# Patient Record
Sex: Female | Born: 1950 | Race: White | Hispanic: No | Marital: Single | Smoking: Never smoker
Health system: Southern US, Community
[De-identification: ages and names within clinical notes are randomized; demographics above are authoritative.]

## PROBLEM LIST (undated history)

## (undated) DIAGNOSIS — J449 Chronic obstructive pulmonary disease, unspecified: Secondary | ICD-10-CM

---

## 2008-12-21 ENCOUNTER — Emergency Department: Payer: Self-pay | Admitting: Emergency Medicine

## 2011-10-15 ENCOUNTER — Other Ambulatory Visit: Payer: Self-pay

## 2011-10-15 LAB — COMPREHENSIVE METABOLIC PANEL
Alkaline Phosphatase: 65 U/L (ref 50–136)
Bilirubin,Total: 0.4 mg/dL (ref 0.2–1.0)
Calcium, Total: 9.5 mg/dL (ref 8.5–10.1)
Chloride: 101 mmol/L (ref 98–107)
Co2: 33 mmol/L — ABNORMAL HIGH (ref 21–32)
Creatinine: 0.94 mg/dL (ref 0.60–1.30)
EGFR (African American): >60
EGFR (Non-African Amer.): >60
Osmolality: 286 (ref 275–301)
SGOT(AST): 25 U/L (ref 15–37)
SGPT (ALT): 26 U/L
Sodium: 143 mmol/L (ref 136–145)

## 2011-12-30 ENCOUNTER — Emergency Department: Payer: Self-pay | Admitting: Emergency Medicine

## 2011-12-30 LAB — BASIC METABOLIC PANEL
Anion Gap: 10 (ref 7–16)
BUN: 15 mg/dL (ref 7–18)
Calcium, Total: 8.9 mg/dL (ref 8.5–10.1)
Chloride: 100 mmol/L (ref 98–107)
EGFR (African American): >60
Osmolality: 281 (ref 275–301)
Potassium: 3.7 mmol/L (ref 3.5–5.1)
Sodium: 141 mmol/L (ref 136–145)

## 2011-12-30 LAB — TROPONIN I: Troponin-I: <0.02 ng/mL

## 2011-12-30 LAB — CBC
HCT: 43 % (ref 35.0–47.0)
Platelet: 264 10*3/uL (ref 150–440)
RBC: 5.24 10*6/uL — ABNORMAL HIGH (ref 3.80–5.20)
WBC: 9.4 10*3/uL (ref 3.6–11.0)

## 2012-10-03 ENCOUNTER — Ambulatory Visit: Payer: Self-pay | Admitting: Specialist

## 2012-11-14 ENCOUNTER — Ambulatory Visit: Payer: Self-pay | Admitting: Internal Medicine

## 2012-11-16 LAB — EXPECTORATED SPUTUM ASSESSMENT W GRAM STAIN, RFLX TO RESP C

## 2012-12-05 LAB — CULTURE, FUNGUS WITHOUT SMEAR

## 2013-01-05 ENCOUNTER — Ambulatory Visit: Payer: Self-pay | Admitting: Specialist

## 2013-05-14 ENCOUNTER — Ambulatory Visit: Payer: Self-pay | Admitting: Specialist

## 2014-05-10 ENCOUNTER — Ambulatory Visit: Payer: Self-pay | Admitting: Specialist

## 2014-05-18 DIAGNOSIS — J449 Chronic obstructive pulmonary disease, unspecified: Secondary | ICD-10-CM | POA: Diagnosis present

## 2014-07-26 ENCOUNTER — Inpatient Hospital Stay (HOSPITAL_COMMUNITY)
Admission: AD | Admit: 2014-07-26 | Discharge: 2014-08-26 | DRG: 003 | Payer: 59 | Source: Other Acute Inpatient Hospital | Attending: Neurosurgery | Admitting: Neurosurgery

## 2014-07-26 ENCOUNTER — Encounter (HOSPITAL_COMMUNITY): Payer: Self-pay | Admitting: Radiology

## 2014-07-26 ENCOUNTER — Inpatient Hospital Stay (HOSPITAL_COMMUNITY): Payer: 59

## 2014-07-26 ENCOUNTER — Emergency Department: Payer: Self-pay | Admitting: Emergency Medicine

## 2014-07-26 DIAGNOSIS — T17990A Other foreign object in respiratory tract, part unspecified in causing asphyxiation, initial encounter: Secondary | ICD-10-CM | POA: Diagnosis present

## 2014-07-26 DIAGNOSIS — F419 Anxiety disorder, unspecified: Secondary | ICD-10-CM | POA: Diagnosis present

## 2014-07-26 DIAGNOSIS — E8770 Fluid overload, unspecified: Secondary | ICD-10-CM | POA: Diagnosis present

## 2014-07-26 DIAGNOSIS — R000 Tachycardia, unspecified: Secondary | ICD-10-CM | POA: Diagnosis present

## 2014-07-26 DIAGNOSIS — D649 Anemia, unspecified: Secondary | ICD-10-CM | POA: Diagnosis present

## 2014-07-26 DIAGNOSIS — Y929 Unspecified place or not applicable: Secondary | ICD-10-CM

## 2014-07-26 DIAGNOSIS — I609 Nontraumatic subarachnoid hemorrhage, unspecified: Principal | ICD-10-CM

## 2014-07-26 DIAGNOSIS — J690 Pneumonitis due to inhalation of food and vomit: Secondary | ICD-10-CM | POA: Diagnosis not present

## 2014-07-26 DIAGNOSIS — R0902 Hypoxemia: Secondary | ICD-10-CM

## 2014-07-26 DIAGNOSIS — L270 Generalized skin eruption due to drugs and medicaments taken internally: Secondary | ICD-10-CM | POA: Diagnosis present

## 2014-07-26 DIAGNOSIS — I340 Nonrheumatic mitral (valve) insufficiency: Secondary | ICD-10-CM | POA: Diagnosis present

## 2014-07-26 DIAGNOSIS — A419 Sepsis, unspecified organism: Secondary | ICD-10-CM | POA: Diagnosis not present

## 2014-07-26 DIAGNOSIS — B49 Unspecified mycosis: Secondary | ICD-10-CM | POA: Diagnosis present

## 2014-07-26 DIAGNOSIS — R6521 Severe sepsis with septic shock: Secondary | ICD-10-CM | POA: Diagnosis not present

## 2014-07-26 DIAGNOSIS — Z930 Tracheostomy status: Secondary | ICD-10-CM

## 2014-07-26 DIAGNOSIS — J9621 Acute and chronic respiratory failure with hypoxia: Secondary | ICD-10-CM | POA: Diagnosis not present

## 2014-07-26 DIAGNOSIS — N179 Acute kidney failure, unspecified: Secondary | ICD-10-CM | POA: Diagnosis not present

## 2014-07-26 DIAGNOSIS — X58XXXA Exposure to other specified factors, initial encounter: Secondary | ICD-10-CM | POA: Diagnosis present

## 2014-07-26 DIAGNOSIS — E876 Hypokalemia: Secondary | ICD-10-CM | POA: Diagnosis not present

## 2014-07-26 DIAGNOSIS — I739 Peripheral vascular disease, unspecified: Secondary | ICD-10-CM | POA: Diagnosis present

## 2014-07-26 DIAGNOSIS — R51 Headache: Secondary | ICD-10-CM | POA: Diagnosis present

## 2014-07-26 DIAGNOSIS — E870 Hyperosmolality and hypernatremia: Secondary | ICD-10-CM | POA: Diagnosis present

## 2014-07-26 DIAGNOSIS — G919 Hydrocephalus, unspecified: Secondary | ICD-10-CM

## 2014-07-26 DIAGNOSIS — K567 Ileus, unspecified: Secondary | ICD-10-CM | POA: Diagnosis not present

## 2014-07-26 DIAGNOSIS — R739 Hyperglycemia, unspecified: Secondary | ICD-10-CM | POA: Diagnosis present

## 2014-07-26 DIAGNOSIS — J449 Chronic obstructive pulmonary disease, unspecified: Secondary | ICD-10-CM | POA: Diagnosis present

## 2014-07-26 DIAGNOSIS — J189 Pneumonia, unspecified organism: Secondary | ICD-10-CM | POA: Diagnosis not present

## 2014-07-26 DIAGNOSIS — J9620 Acute and chronic respiratory failure, unspecified whether with hypoxia or hypercapnia: Secondary | ICD-10-CM | POA: Diagnosis present

## 2014-07-26 DIAGNOSIS — T380X5A Adverse effect of glucocorticoids and synthetic analogues, initial encounter: Secondary | ICD-10-CM | POA: Diagnosis present

## 2014-07-26 DIAGNOSIS — Z9289 Personal history of other medical treatment: Secondary | ICD-10-CM

## 2014-07-26 DIAGNOSIS — J9600 Acute respiratory failure, unspecified whether with hypoxia or hypercapnia: Secondary | ICD-10-CM

## 2014-07-26 DIAGNOSIS — Z452 Encounter for adjustment and management of vascular access device: Secondary | ICD-10-CM

## 2014-07-26 DIAGNOSIS — I10 Essential (primary) hypertension: Secondary | ICD-10-CM | POA: Diagnosis present

## 2014-07-26 DIAGNOSIS — T50905A Adverse effect of unspecified drugs, medicaments and biological substances, initial encounter: Secondary | ICD-10-CM | POA: Diagnosis present

## 2014-07-26 DIAGNOSIS — T85598A Other mechanical complication of other gastrointestinal prosthetic devices, implants and grafts, initial encounter: Secondary | ICD-10-CM

## 2014-07-26 DIAGNOSIS — N390 Urinary tract infection, site not specified: Secondary | ICD-10-CM | POA: Diagnosis present

## 2014-07-26 DIAGNOSIS — J9690 Respiratory failure, unspecified, unspecified whether with hypoxia or hypercapnia: Secondary | ICD-10-CM

## 2014-07-26 DIAGNOSIS — G8194 Hemiplegia, unspecified affecting left nondominant side: Secondary | ICD-10-CM | POA: Diagnosis present

## 2014-07-26 DIAGNOSIS — J9602 Acute respiratory failure with hypercapnia: Secondary | ICD-10-CM

## 2014-07-26 DIAGNOSIS — E872 Acidosis: Secondary | ICD-10-CM

## 2014-07-26 DIAGNOSIS — I671 Cerebral aneurysm, nonruptured: Secondary | ICD-10-CM | POA: Diagnosis present

## 2014-07-26 DIAGNOSIS — I67848 Other cerebrovascular vasospasm and vasoconstriction: Secondary | ICD-10-CM

## 2014-07-26 DIAGNOSIS — J9601 Acute respiratory failure with hypoxia: Secondary | ICD-10-CM | POA: Diagnosis present

## 2014-07-26 DIAGNOSIS — R531 Weakness: Secondary | ICD-10-CM | POA: Diagnosis present

## 2014-07-26 DIAGNOSIS — Z4659 Encounter for fitting and adjustment of other gastrointestinal appliance and device: Secondary | ICD-10-CM

## 2014-07-26 DIAGNOSIS — J9811 Atelectasis: Secondary | ICD-10-CM

## 2014-07-26 HISTORY — DX: Chronic obstructive pulmonary disease, unspecified: J44.9

## 2014-07-26 LAB — COMPREHENSIVE METABOLIC PANEL
ALBUMIN: 3.2 g/dL — AB (ref 3.4–5.0)
ALK PHOS: 60 U/L
ANION GAP: 7 (ref 7–16)
BUN: 12 mg/dL (ref 7–18)
Bilirubin,Total: 0.3 mg/dL (ref 0.2–1.0)
CALCIUM: 8.7 mg/dL (ref 8.5–10.1)
CHLORIDE: 105 mmol/L (ref 98–107)
CO2: 31 mmol/L (ref 21–32)
Creatinine: 1.05 mg/dL (ref 0.60–1.30)
EGFR (Non-African Amer.): 56 — ABNORMAL LOW
Glucose: 153 mg/dL — ABNORMAL HIGH (ref 65–99)
OSMOLALITY: 288 (ref 275–301)
POTASSIUM: 4.1 mmol/L (ref 3.5–5.1)
SGOT(AST): 27 U/L (ref 15–37)
SGPT (ALT): 22 U/L
Sodium: 143 mmol/L (ref 136–145)
Total Protein: 6.6 g/dL (ref 6.4–8.2)

## 2014-07-26 LAB — POCT I-STAT 3, ART BLOOD GAS (G3+)
Acid-Base Excess: 5 mmol/L — ABNORMAL HIGH (ref 0.0–2.0)
Bicarbonate: 29.2 mEq/L — ABNORMAL HIGH (ref 20.0–24.0)
O2 Saturation: 99 %
PCO2 ART: 43.8 mmHg (ref 35.0–45.0)
PH ART: 7.434 (ref 7.350–7.450)
PO2 ART: 156 mmHg — AB (ref 80.0–100.0)
Patient temperature: 100
TCO2: 30 mmol/L (ref 0–100)

## 2014-07-26 LAB — CBC
HCT: 37.7 % (ref 35.0–47.0)
HEMATOCRIT: 33.2 % — AB (ref 36.0–46.0)
HGB: 11.7 g/dL — AB (ref 12.0–16.0)
Hemoglobin: 10.3 g/dL — ABNORMAL LOW (ref 12.0–15.0)
MCH: 27.3 pg (ref 26.0–34.0)
MCH: 26.8 pg (ref 26.0–34.0)
MCHC: 31.2 g/dL — AB (ref 32.0–36.0)
MCHC: 31 g/dL (ref 30.0–36.0)
MCV: 88 fL (ref 80–100)
MCV: 86.5 fL (ref 78.0–100.0)
PLATELETS: 186 10*3/uL (ref 150–440)
Platelets: 163 10*3/uL (ref 150–400)
RBC: 4.3 10*6/uL (ref 3.80–5.20)
RBC: 3.84 MIL/uL — ABNORMAL LOW (ref 3.87–5.11)
RDW: 13.7 % (ref 11.5–14.5)
RDW: 13.2 % (ref 11.5–15.5)
WBC: 17.5 10*3/uL — AB (ref 3.6–11.0)
WBC: 19.6 10*3/uL — ABNORMAL HIGH (ref 4.0–10.5)

## 2014-07-26 LAB — URINALYSIS, COMPLETE
BACTERIA: NONE SEEN
BLOOD: NEGATIVE
Bilirubin,UR: NEGATIVE
GLUCOSE, UR: NEGATIVE mg/dL (ref 0–75)
Ketone: NEGATIVE
LEUKOCYTE ESTERASE: NEGATIVE
NITRITE: NEGATIVE
PROTEIN: NEGATIVE
Ph: 5 (ref 4.5–8.0)
RBC,UR: 1 /HPF (ref 0–5)
SPECIFIC GRAVITY: 1.012 (ref 1.003–1.030)
Squamous Epithelial: NONE SEEN
WBC UR: <1 /HPF (ref 0–5)

## 2014-07-26 LAB — PROTIME-INR
INR: 1
INR: 2.52 — AB (ref 0.00–1.49)
PROTHROMBIN TIME: 12.6 s (ref 11.5–14.7)
PROTHROMBIN TIME: 27.3 s — AB (ref 11.6–15.2)

## 2014-07-26 LAB — APTT: APTT: 42 s — AB (ref 24–37)

## 2014-07-26 LAB — GLUCOSE, CAPILLARY: GLUCOSE-CAPILLARY: 124 mg/dL — AB (ref 70–99)

## 2014-07-26 LAB — TROPONIN I: Troponin-I: 0.03 ng/mL

## 2014-07-26 LAB — MRSA PCR SCREENING: MRSA BY PCR: NEGATIVE

## 2014-07-26 LAB — CK TOTAL AND CKMB (NOT AT ARMC)
CK, Total: 73 U/L
CK-MB: 1 ng/mL (ref 0.5–3.6)

## 2014-07-26 MED ORDER — PROPOFOL 10 MG/ML IV EMUL
0.0000 ug/kg/min | INTRAVENOUS | Status: DC
  Administered 2014-07-26 – 2014-07-27 (×3): 20 ug/kg/min via INTRAVENOUS

## 2014-07-26 MED ORDER — ACETAMINOPHEN-CODEINE #3 300-30 MG PO TABS: 1.0000 | ORAL_TABLET | ORAL | Status: DC | PRN

## 2014-07-26 MED ORDER — LABETALOL HCL 5 MG/ML IV SOLN
10.0000 mg | INTRAVENOUS | Status: DC | PRN
  Administered 2014-07-26: 20 mg via INTRAVENOUS
  Filled 2014-07-26: qty 4

## 2014-07-26 MED ORDER — FENTANYL CITRATE 0.05 MG/ML IJ SOLN
INTRAMUSCULAR | Status: AC
  Administered 2014-07-26: 50 ug via INTRAVENOUS
  Filled 2014-07-26: qty 2

## 2014-07-26 MED ORDER — MORPHINE SULFATE 2 MG/ML IJ SOLN: 1.0000 mg | INTRAMUSCULAR | Status: DC | PRN

## 2014-07-26 MED ORDER — STROKE: EARLY STAGES OF RECOVERY BOOK
Freq: Once | Status: AC
  Administered 2014-07-26: 1
  Filled 2014-07-26: qty 1

## 2014-07-26 MED ORDER — FENTANYL CITRATE 0.05 MG/ML IJ SOLN
50.0000 ug | INTRAMUSCULAR | Status: DC | PRN
  Administered 2014-07-26 – 2014-07-27 (×4): 50 ug via INTRAVENOUS
  Filled 2014-07-26 (×3): qty 2

## 2014-07-26 MED ORDER — PANTOPRAZOLE SODIUM 40 MG IV SOLR: 40.0000 mg | Freq: Every day | INTRAVENOUS | Status: DC

## 2014-07-26 MED ORDER — PANTOPRAZOLE SODIUM 40 MG PO PACK
40.0000 mg | PACK | Freq: Every day | ORAL | Status: DC
  Administered 2014-07-27 – 2014-07-29 (×3): 40 mg
  Filled 2014-07-26 (×4): qty 20

## 2014-07-26 MED ORDER — PROPOFOL 10 MG/ML IV EMUL
INTRAVENOUS | Status: AC
  Filled 2014-07-26: qty 100

## 2014-07-26 MED ORDER — CHLORHEXIDINE GLUCONATE 0.12 % MT SOLN
15.0000 mL | Freq: Two times a day (BID) | OROMUCOSAL | Status: DC
  Administered 2014-07-26 – 2014-08-26 (×61): 15 mL via OROMUCOSAL
  Filled 2014-07-26 (×64): qty 15

## 2014-07-26 MED ORDER — CETYLPYRIDINIUM CHLORIDE 0.05 % MT LIQD
7.0000 mL | Freq: Four times a day (QID) | OROMUCOSAL | Status: DC
  Administered 2014-07-27 – 2014-08-26 (×119): 7 mL via OROMUCOSAL

## 2014-07-26 MED ORDER — HYDRALAZINE HCL 20 MG/ML IJ SOLN
5.0000 mg | INTRAMUSCULAR | Status: DC | PRN
  Administered 2014-08-02: 20 mg via INTRAVENOUS
  Filled 2014-07-26: qty 1

## 2014-07-26 MED ORDER — LABETALOL HCL 5 MG/ML IV SOLN: 5.0000 mg | INTRAVENOUS | Status: DC | PRN

## 2014-07-26 MED ORDER — NIMODIPINE 60 MG/20ML PO SOLN
60.0000 mg | ORAL | Status: DC
  Administered 2014-07-27 – 2014-08-10 (×84): 60 mg
  Filled 2014-07-26 (×96): qty 20

## 2014-07-26 MED ORDER — NIMODIPINE 30 MG PO CAPS
60.0000 mg | ORAL_CAPSULE | ORAL | Status: DC
  Filled 2014-07-26 (×28): qty 2

## 2014-07-26 MED ORDER — INSULIN ASPART 100 UNIT/ML ~~LOC~~ SOLN
2.0000 [IU] | SUBCUTANEOUS | Status: DC
  Administered 2014-07-26 – 2014-07-27 (×2): 2 [IU] via SUBCUTANEOUS
  Administered 2014-07-27: 4 [IU] via SUBCUTANEOUS
  Administered 2014-07-28 (×2): 2 [IU] via SUBCUTANEOUS
  Administered 2014-07-28: 4 [IU] via SUBCUTANEOUS
  Administered 2014-07-28: 2 [IU] via SUBCUTANEOUS
  Administered 2014-07-28: 4 [IU] via SUBCUTANEOUS
  Administered 2014-07-29: 2 [IU] via SUBCUTANEOUS
  Administered 2014-07-29: 4 [IU] via SUBCUTANEOUS
  Administered 2014-07-29: 2 [IU] via SUBCUTANEOUS

## 2014-07-26 MED ORDER — ACETAMINOPHEN 650 MG RE SUPP: 650.0000 mg | RECTAL | Status: DC | PRN

## 2014-07-26 MED ORDER — LEVETIRACETAM IN NACL 500 MG/100ML IV SOLN
500.0000 mg | Freq: Two times a day (BID) | INTRAVENOUS | Status: DC
  Administered 2014-07-26 – 2014-08-09 (×28): 500 mg via INTRAVENOUS
  Filled 2014-07-26 (×29): qty 100

## 2014-07-26 MED ORDER — SODIUM CHLORIDE 0.9 % IV SOLN
1.5000 g | Freq: Four times a day (QID) | INTRAVENOUS | Status: DC
  Administered 2014-07-26 – 2014-07-27 (×5): 1.5 g via INTRAVENOUS
  Filled 2014-07-26 (×8): qty 1.5

## 2014-07-26 MED ORDER — SODIUM CHLORIDE 0.9 % IV SOLN
INTRAVENOUS | Status: DC
  Administered 2014-07-26 – 2014-07-27 (×2): 80 mL/h via INTRAVENOUS
  Administered 2014-07-27: 16:00:00 via INTRAVENOUS

## 2014-07-26 MED ORDER — IOHEXOL 350 MG/ML SOLN
50.0000 mL | Freq: Once | INTRAVENOUS | Status: AC | PRN
  Administered 2014-07-26: 50 mL via INTRAVENOUS

## 2014-07-26 MED ORDER — SENNOSIDES-DOCUSATE SODIUM 8.6-50 MG PO TABS
1.0000 | ORAL_TABLET | Freq: Two times a day (BID) | ORAL | Status: DC
  Administered 2014-07-27 – 2014-07-29 (×4): 1 via ORAL
  Filled 2014-07-26 (×9): qty 1

## 2014-07-26 MED ORDER — ACETAMINOPHEN 325 MG PO TABS
650.0000 mg | ORAL_TABLET | ORAL | Status: DC | PRN
  Administered 2014-07-27 – 2014-08-12 (×6): 650 mg via ORAL
  Filled 2014-07-26 (×6): qty 2

## 2014-07-26 NOTE — Progress Notes (Signed)
BP 105/57 mmHg  Pulse 89  Temp(Src) 100 F (37.8 C) (Axillary)  Resp 14  Ht 5' (1.524 m)  Wt 46.7 kg (102 lb 15.3 oz)  BMI 20.11 kg/m2  SpO2 100% CTA reviewed. 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 Consistent with sah. Will plan on Angio in the morning and possible endovascular treatment tomorrow.

## 2014-07-26 NOTE — Progress Notes (Signed)
ANTIBIOTIC CONSULT NOTE - INITIAL  Pharmacy Consult for unasyn Indication: aspiration PNA  Not on File  Patient Measurements: Height: 5' (152.4 cm) Weight: 102 lb 15.3 oz (46.7 kg) IBW/kg (Calculated) : 45.5   Vital Signs: Temp: 100 F (37.8 C) (11/02 1952) Temp Source: Axillary (11/02 1952) BP: 105/57 mmHg (11/02 2100) Pulse Rate: 89 (11/02 2100) Intake/Output from previous day:   Intake/Output from this shift: Total I/O In: 180 [I.V.:80; IV Piggyback:100] Out: -   Labs:  Recent Labs  07/26/14 2148  WBC 19.6*  HGB 10.3*  PLT 163   CrCl cannot be calculated (Patient has no serum creatinine result on file.). No results for input(s): VANCOTROUGH, VANCOPEAK, VANCORANDOM, GENTTROUGH, GENTPEAK, GENTRANDOM, TOBRATROUGH, TOBRAPEAK, TOBRARND, AMIKACINPEAK, AMIKACINTROU, AMIKACIN in the last 72 hours.   Microbiology: No results found for this or any previous visit (from the past 720 hour(s)).   Assessment: Stephanie Doyle YOF brought in by EMS after co-workers called for a severe headache and mental status changes. She vomited in ambulance and aspiration suspected. She was originally taken to [facility] but was transferred here after a SAH was discovered. SCr 1.05 per [place] Records, est CrCl ~5040mL/min  Goal of Therapy:  eradication of infection  Plan:  1. Unasyn 1.5g IV q6h 2. Follow renal function, c/s, and clinical progression  [name] D. Theon Sobotka, PharmD, BCPS Clinical Pharmacist Pager: [phone] 07/26/2014 10:13 PM

## 2014-07-26 NOTE — Consult Note (Signed)
PULMONARY / CRITICAL CARE MEDICINE   Name: Stephanie SilvasVicky D Doyle MRN: [id number] DOB: 10/25/1950    ADMISSION DATE:  07/26/2014 CONSULTATION DATE:  07/26/2014  REFERRING MD :  Dr. Franky Machoabbell  CHIEF COMPLAINT:  Headache  INITIAL PRESENTATION: 63 year old female with no known medical history presented [facility] 11/2 with severe headache, found to have SAH. AMS in ED requiring intubation. [facility]. PCCM consulted for vent and ICU management.  STUDIES:  11/2 CT head > diffuse SAH, frontal and temporal bilaterally.  11/2 CTA head >>>  SIGNIFICANT EVENTS: 11/2[facility]- SAH, Intubated for airway protection, [facility]  HISTORY OF PRESENT ILLNESS:  63 year old female with no known medical history was at work 11/2 when she noted a severe headache. She was [facility] via [facility]. She vomited in route and it was strongly felt by EMS team that she aspirated at that time. In ED she was found to have SAH on CT scan, and required intubation for airway protection. [facility]. PCCM has been asked to eval for vent and ICU management.   PAST MEDICAL HISTORY :   has no past medical history on file.  has no past surgical history on file. Prior to Admission medications   Not on File   Allergies not on file  FAMILY HISTORY:  has no family status information on file.  SOCIAL HISTORY:   REVIEW OF SYSTEMS:  Unable as patient is encephalopathic  SUBJECTIVE:   VITAL SIGNS: Temp:  [100 F (37.8 C)] 100 F (37.8 C) (11/02 1952) Pulse Rate:  [102-109] 103 (11/02 2015) Resp:  [14-20] 14 (11/02 2015) BP: (163-180)/(80-85) 170/84 mmHg (11/02 2015) SpO2:  [100 %] 100 % (11/02 2015) FiO2 (%):  [40 %] 40 % (11/02 2015) Weight:  [46.7 kg (102 lb 15.3 oz)] 46.7 kg (102 lb 15.3 oz) (11/02 1952) HEMODYNAMICS:   VENTILATOR SETTINGS: Vent Mode:  [-] PRVC FiO2 (%):  [40 %] 40 % Set Rate:  [14 bmp] 14 bmp Vt Set:  [370 mL] 370 mL PEEP:  [5 cmH20]  5 cmH20 Plateau Pressure:  [13 cmH20] 13 cmH20 INTAKE / OUTPUT: No intake or output data in the 24 hours ending 07/26/14 2108  PHYSICAL EXAMINATION: General:  Thin female on vent Neuro:  Sedated on vent, unresponsive HEENT:  [place]/AT, No JVD noted Cardiovascular:  RRR, no MRG Lungs:  Respirations even, unlabored. Clear breath sounds Abdomen:  Soft, non-distended. Hypoactive BS Musculoskeletal:  No acute deformity. No edema Skin:  Intact  LABS:  CBC No results for input(s): WBC, HGB, HCT, PLT in the last 168 hours. Coag's No results for input(s): APTT, INR in the last 168 hours. BMET No results for input(s): NA, K, CL, CO2, BUN, CREATININE, GLUCOSE in the last 168 hours. Electrolytes No results for input(s): CALCIUM, MG, PHOS in the last 168 hours. Sepsis Markers No results for input(s): LATICACIDVEN, PROCALCITON, O2SATVEN in the last 168 hours. ABG No results for input(s): PHART, PCO2ART, PO2ART in the last 168 hours. Liver Enzymes No results for input(s): AST, ALT, ALKPHOS, BILITOT, ALBUMIN in the last 168 hours. Cardiac Enzymes No results for input(s): TROPONINI, PROBNP in the last 168 hours. Glucose No results for input(s): GLUCAP in the last 168 hours.  Imaging No results found.   ASSESSMENT / PLAN:  NEUROLOGIC A:   Diffuse SAH  P:   Management per Neurosurgery RASS goal: 0 to -1 Propofol gtt Fentanyl PRN  for analgesia Daily WUA  PULMONARY OETT 11/2 >>> A: Acute hypoxemic respiratory failure 2nd to impaired airway protection  P:   Full vent support F/u CXR and ABG Daily SBT PRN BD's  CARDIOVASCULAR A:  Hypertension  P:  Continuous cardiac monitoring PRN hydralazine, labetalol  Keep SBP < 160 per neurosurgery  RENAL A:   No active issues  P:   Follow Bmet  GASTROINTESTINAL A:   Nutrition GI prophylaxis  P:   Consider TF in AM 11/3 SUP: IV pantoprazole   HEMATOLOGIC A:   Anemia (hgb 5.7 on most recent lab)  P:  Result  suspected to be spurious. Repeat CBC  INFECTIOUS A:   Probable aspiration PNA  P:   BCx2 11/2 >>> UC 11/2 >>> Sputum 11/2 >>> Abx: Unasyn, start date 11/2, day 1 Low threshold to DC abx  ENDOCRINE A:   No acute issues  P:   Follow CBG SSI   FAMILY  - Updates:   - Inter-disciplinary family meet or Palliative Care meeting due by:  11/9   Joneen RoachPaul Hunner Garcon, ACNP [facility] Pulmonology/Critical Care Pager [phone] or [phone]   TODAY'S SUMMARY:

## 2014-07-26 NOTE — Progress Notes (Signed)
CRITICAL VALUE ALERT  Critical value received:  Hemoglobin 5.3   Date of notification:  07/26/2014  Time of notification:  2134  Critical value read back:Yes.    Nurse who received alert:  Emelia Salisburyarrie Cherlynn Popiel, RN  MD notified (1st page):  Joneen RoachPaul Hoffman, NP  Time of first page:  [phone]  MD notified (2nd page):  Time of second page:  Responding MD:  Joneen RoachPaul Hoffman, NP  Time MD responded:  2136

## 2014-07-26 NOTE — H&P (Signed)
Stephanie Doyle is an 63 y.o. female.   Chief Complaint: SAH, headache HPI: whom while at work complained of a severe headache. Her coworkers called an ambulance. EMS noted a decline in mental status, increasing confusion. She did vomit while in the ambulance and EMS felt she probably aspirated. Head CT at [place] revealed a SAH. She is transferred for further evaluation.   No past medical history on file.  No past surgical history on file.  No family history on file. Social History:  has no tobacco, alcohol, and drug history on file.  Allergies: Allergies not on file  No prescriptions prior to admission    No results found for this or any previous visit (from the past 48 hour(s)). No results found.  Review of Systems  Unable to perform ROS: mental acuity    Blood pressure 180/85, pulse 106, resp. rate 18, height 5' (1.524 m), SpO2 100 %. Physical Exam  Constitutional: She appears well-developed and well-nourished.  HENT:  Head: Normocephalic and atraumatic.  Eyes: Pupils are equal, round, and reactive to light.  Cardiovascular: Regular rhythm, normal heart sounds and intact distal pulses.   Respiratory:  ventilated  GI: Soft. Bowel sounds are normal. She exhibits no distension.  Neurological: She is unresponsive.  Patient is currently sedated and on the ventilator.  Unable to follow commands at this time Localizes with both upper extremities, withdraws lower extremities to noxious stimuli Perrl Intact Corneals Intact cough Unable to assess sensory system Intact oculocephalics     Assessment/Plan Stephanie Doyle is a 63 y.o. female With SAH today no known etiology. Will pursue an etiology with CTA. Treatment dependent on etioloy. CT angio  Admit to ICU Control blood pressure, nimodipine  Rasheka Denard L 07/26/2014, 7:55 PM

## 2014-07-27 ENCOUNTER — Inpatient Hospital Stay (HOSPITAL_COMMUNITY): Payer: 59 | Admitting: Anesthesiology

## 2014-07-27 ENCOUNTER — Inpatient Hospital Stay (HOSPITAL_COMMUNITY): Payer: 59

## 2014-07-27 ENCOUNTER — Encounter (HOSPITAL_COMMUNITY): Payer: Self-pay | Admitting: Certified Registered"

## 2014-07-27 ENCOUNTER — Encounter (HOSPITAL_COMMUNITY)
Admission: AD | Disposition: A | Discharge status: Other Acute Inpatient Hospital | Payer: Self-pay | Source: Other Acute Inpatient Hospital | Attending: Neurosurgery

## 2014-07-27 DIAGNOSIS — I609 Nontraumatic subarachnoid hemorrhage, unspecified: Principal | ICD-10-CM

## 2014-07-27 DIAGNOSIS — J9600 Acute respiratory failure, unspecified whether with hypoxia or hypercapnia: Secondary | ICD-10-CM

## 2014-07-27 DIAGNOSIS — D649 Anemia, unspecified: Secondary | ICD-10-CM | POA: Diagnosis present

## 2014-07-27 HISTORY — PX: CRANIOTOMY: SHX93

## 2014-07-27 LAB — GLUCOSE, CAPILLARY
GLUCOSE-CAPILLARY: 109 mg/dL — AB (ref 70–99)
GLUCOSE-CAPILLARY: 100 mg/dL — AB (ref 70–99)
Glucose-Capillary: 126 mg/dL — ABNORMAL HIGH (ref 70–99)
Glucose-Capillary: 183 mg/dL — ABNORMAL HIGH (ref 70–99)

## 2014-07-27 LAB — BASIC METABOLIC PANEL
ANION GAP: 11 (ref 5–15)
Anion gap: 40 — ABNORMAL HIGH (ref 5–15)
Anion gap: 10 (ref 5–15)
BUN: 7 mg/dL (ref 6–23)
BUN: 8 mg/dL (ref 6–23)
BUN: 13 mg/dL (ref 6–23)
CALCIUM: 4.2 mg/dL — AB (ref 8.4–10.5)
CO2: 16 mEq/L — ABNORMAL LOW (ref 19–32)
CO2: 27 mEq/L (ref 19–32)
CO2: 15 meq/L — AB (ref 19–32)
CREATININE: 0.54 mg/dL (ref 0.50–1.10)
Calcium: 4.4 mg/dL — CL (ref 8.4–10.5)
Calcium: 8.5 mg/dL (ref 8.4–10.5)
Chloride: 116 mEq/L — ABNORMAL HIGH (ref 96–112)
Chloride: 120 mEq/L — ABNORMAL HIGH (ref 96–112)
Chloride: 102 mEq/L (ref 96–112)
Creatinine, Ser: 0.5 mg/dL (ref 0.50–1.10)
Creatinine, Ser: 0.99 mg/dL (ref 0.50–1.10)
GFR calc Af Amer: >90 mL/min (ref >90)
GFR calc non Af Amer: >90 mL/min (ref >90)
GFR calc non Af Amer: 59 mL/min — ABNORMAL LOW (ref >90)
GFR, EST AFRICAN AMERICAN: 68 mL/min — AB (ref >90)
GLUCOSE: 78 mg/dL (ref 70–99)
Glucose, Bld: 72 mg/dL (ref 70–99)
Glucose, Bld: 141 mg/dL — ABNORMAL HIGH (ref 70–99)
POTASSIUM: 3.9 meq/L (ref 3.7–5.3)
SODIUM: 140 meq/L (ref 137–147)
Sodium: 171 mEq/L (ref 137–147)
Sodium: 146 mEq/L (ref 137–147)

## 2014-07-27 LAB — CBC
HCT: 18.9 % — ABNORMAL LOW (ref 36.0–46.0)
HEMATOCRIT: 29.9 % — AB (ref 36.0–46.0)
Hemoglobin: 6 g/dL — CL (ref 12.0–15.0)
Hemoglobin: 9.6 g/dL — ABNORMAL LOW (ref 12.0–15.0)
MCH: 28 pg (ref 26.0–34.0)
MCH: 28 pg (ref 26.0–34.0)
MCHC: 31.7 g/dL (ref 30.0–36.0)
MCHC: 32.1 g/dL (ref 30.0–36.0)
MCV: 88.3 fL (ref 78.0–100.0)
MCV: 87.2 fL (ref 78.0–100.0)
PLATELETS: 102 10*3/uL — AB (ref 150–400)
PLATELETS: 141 10*3/uL — AB (ref 150–400)
RBC: 2.14 MIL/uL — ABNORMAL LOW (ref 3.87–5.11)
RBC: 3.43 MIL/uL — AB (ref 3.87–5.11)
RDW: 13.5 % (ref 11.5–15.5)
RDW: 13.6 % (ref 11.5–15.5)
WBC: 9.1 10*3/uL (ref 4.0–10.5)
WBC: 16.8 10*3/uL — ABNORMAL HIGH (ref 4.0–10.5)

## 2014-07-27 LAB — PROTIME-INR
INR: 1.19 (ref 0.00–1.49)
Prothrombin Time: 15.2 seconds (ref 11.6–15.2)

## 2014-07-27 LAB — PREPARE RBC (CROSSMATCH)

## 2014-07-27 LAB — ABO/RH: ABO/RH(D): A NEG

## 2014-07-27 SURGERY — RADIOLOGY WITH ANESTHESIA
Anesthesia: General

## 2014-07-27 SURGERY — CRANIOTOMY, FOR VERTEBRAL/BASILAR ARTERY ANEURYSM REPAIR
Anesthesia: General | Site: Head | Laterality: Right

## 2014-07-27 MED ORDER — BACITRACIN ZINC 500 UNIT/GM EX OINT
TOPICAL_OINTMENT | CUTANEOUS | Status: DC | PRN
  Administered 2014-07-27: 1 via TOPICAL

## 2014-07-27 MED ORDER — THROMBIN 20000 UNITS EX SOLR
CUTANEOUS | Status: DC | PRN
  Administered 2014-07-27: 17:00:00 via TOPICAL

## 2014-07-27 MED ORDER — SODIUM CHLORIDE 0.9 % IV SOLN
2500.0000 ug | INTRAVENOUS | Status: DC | PRN
  Administered 2014-07-27: 50 ug/h via INTRAVENOUS

## 2014-07-27 MED ORDER — NOREPINEPHRINE BITARTRATE 1 MG/ML IV SOLN
0.0000 ug/min | INTRAVENOUS | Status: DC
  Administered 2014-07-27: 10 ug/min via INTRAVENOUS
  Administered 2014-07-30: 2 ug/min via INTRAVENOUS
  Filled 2014-07-27 (×2): qty 16

## 2014-07-27 MED ORDER — FENTANYL CITRATE 0.05 MG/ML IJ SOLN
25.0000 ug/h | INTRAMUSCULAR | Status: DC
  Administered 2014-07-27: 25 ug/h via INTRAVENOUS
  Administered 2014-07-30 – 2014-07-31 (×2): 50 ug/h via INTRAVENOUS
  Administered 2014-08-02 – 2014-08-03 (×2): 75 ug/h via INTRAVENOUS
  Administered 2014-08-04: 100 ug/h via INTRAVENOUS
  Administered 2014-08-05: 200 ug/h via INTRAVENOUS
  Administered 2014-08-05: 100 ug/h via INTRAVENOUS
  Administered 2014-08-05: 200 ug/h via INTRAVENOUS
  Administered 2014-08-06: 250 ug/h via INTRAVENOUS
  Administered 2014-08-06: 150 ug/h via INTRAVENOUS
  Administered 2014-08-07: 225 ug/h via INTRAVENOUS
  Administered 2014-08-07 – 2014-08-08 (×3): 300 ug/h via INTRAVENOUS
  Administered 2014-08-09: 150 ug/h via INTRAVENOUS
  Administered 2014-08-09: 250 ug/h via INTRAVENOUS
  Administered 2014-08-10: 125 ug/h via INTRAVENOUS
  Administered 2014-08-11: 100 ug/h via INTRAVENOUS
  Administered 2014-08-12: 50 ug/h via INTRAVENOUS
  Filled 2014-07-27 (×19): qty 50

## 2014-07-27 MED ORDER — SODIUM CHLORIDE 0.9 % IR SOLN
Status: DC | PRN
  Administered 2014-07-27: 500 mL

## 2014-07-27 MED ORDER — MICROFIBRILLAR COLL HEMOSTAT EX PADS
MEDICATED_PAD | CUTANEOUS | Status: DC | PRN
  Administered 2014-07-27: 1 via TOPICAL

## 2014-07-27 MED ORDER — PROPOFOL 10 MG/ML IV BOLUS
INTRAVENOUS | Status: DC | PRN
  Administered 2014-07-27: 30 mg via INTRAVENOUS

## 2014-07-27 MED ORDER — DEXTROSE 5 % IV SOLN
0.0000 ug/min | INTRAVENOUS | Status: DC
  Administered 2014-07-27: 20 ug/min via INTRAVENOUS
  Administered 2014-07-27: 30 ug/min via INTRAVENOUS
  Filled 2014-07-27 (×2): qty 1

## 2014-07-27 MED ORDER — THROMBIN 5000 UNITS EX SOLR
OROMUCOSAL | Status: DC | PRN
  Administered 2014-07-27: 17:00:00 via TOPICAL

## 2014-07-27 MED ORDER — PHENYLEPHRINE HCL 10 MG/ML IJ SOLN
10.0000 mg | INTRAVENOUS | Status: DC | PRN
  Administered 2014-07-27: 20 ug/min via INTRAVENOUS
  Administered 2014-07-27: 50 ug/min via INTRAVENOUS

## 2014-07-27 MED ORDER — SODIUM CHLORIDE 0.9 % IV SOLN
INTRAVENOUS | Status: DC | PRN
  Administered 2014-07-27 (×2): via INTRAVENOUS

## 2014-07-27 MED ORDER — DEXAMETHASONE SODIUM PHOSPHATE 10 MG/ML IJ SOLN
INTRAMUSCULAR | Status: DC | PRN
  Administered 2014-07-27: 10 mg via INTRAVENOUS

## 2014-07-27 MED ORDER — PROPOFOL INFUSION 10 MG/ML OPTIME
INTRAVENOUS | Status: DC | PRN
  Administered 2014-07-27: 25 ug/kg/min via INTRAVENOUS

## 2014-07-27 MED ORDER — ROCURONIUM BROMIDE 100 MG/10ML IV SOLN
INTRAVENOUS | Status: DC | PRN
  Administered 2014-07-27: 50 mg via INTRAVENOUS
  Administered 2014-07-27: 20 mg via INTRAVENOUS

## 2014-07-27 MED ORDER — FENTANYL CITRATE 0.05 MG/ML IJ SOLN
INTRAMUSCULAR | Status: DC | PRN
  Administered 2014-07-27: 100 ug via INTRAVENOUS
  Administered 2014-07-27: 25 ug via INTRAVENOUS

## 2014-07-27 MED ORDER — SODIUM CHLORIDE 0.9 % IV BOLUS (SEPSIS)
500.0000 mL | Freq: Once | INTRAVENOUS | Status: AC
  Administered 2014-07-27: 500 mL via INTRAVENOUS

## 2014-07-27 MED ORDER — LIDOCAINE HCL 1 % IJ SOLN
INTRAMUSCULAR | Status: AC
  Filled 2014-07-27: qty 20

## 2014-07-27 MED ORDER — NITROPRUSSIDE SODIUM 25 MG/ML IV SOLN
0.2500 ug/kg/min | INTRAVENOUS | Status: AC
  Filled 2014-07-27 (×2): qty 2

## 2014-07-27 MED ORDER — SODIUM CHLORIDE 0.9 % IV SOLN
INTRAVENOUS | Status: DC | PRN
  Administered 2014-07-27 (×2): via INTRAVENOUS

## 2014-07-27 MED ORDER — PROPOFOL 10 MG/ML IV EMUL
INTRAVENOUS | Status: AC
  Filled 2014-07-27: qty 50

## 2014-07-27 MED ORDER — MIDAZOLAM HCL 5 MG/5ML IJ SOLN
INTRAMUSCULAR | Status: DC | PRN
  Administered 2014-07-27 (×2): 1 mg via INTRAVENOUS

## 2014-07-27 MED ORDER — SODIUM CHLORIDE 0.9 % IV SOLN: 10.0000 mL/h | Freq: Once | INTRAVENOUS | Status: DC

## 2014-07-27 MED ORDER — ROCURONIUM BROMIDE 100 MG/10ML IV SOLN
INTRAVENOUS | Status: DC | PRN
  Administered 2014-07-27: 50 mg via INTRAVENOUS

## 2014-07-27 MED ORDER — LIDOCAINE HCL (PF) 1 % IJ SOLN
INTRAMUSCULAR | Status: DC | PRN
  Administered 2014-07-27: 8.5 mL

## 2014-07-27 MED ORDER — MANNITOL 25 % IV SOLN
INTRAVENOUS | Status: DC | PRN
  Administered 2014-07-27: 25 g via INTRAVENOUS

## 2014-07-27 MED ORDER — IOHEXOL 300 MG/ML  SOLN
50.0000 mL | Freq: Once | INTRAMUSCULAR | Status: AC | PRN
  Administered 2014-07-27: 60 mL via INTRA_ARTERIAL

## 2014-07-27 MED ORDER — BUPIVACAINE HCL 0.5 % IJ SOLN
INTRAMUSCULAR | Status: DC | PRN
  Administered 2014-07-27: 8.5 mL

## 2014-07-27 MED ORDER — 0.9 % SODIUM CHLORIDE (POUR BTL) OPTIME
TOPICAL | Status: DC | PRN
  Administered 2014-07-27: 1000 mL

## 2014-07-27 SURGICAL SUPPLY — 98 items
Aesculap yasargil aneurysm clip (Clip) ×2 IMPLANT
BANDAGE GAUZE 4  KLING STR (GAUZE/BANDAGES/DRESSINGS) ×6 IMPLANT
BENZOIN TINCTURE PRP APPL 2/3 (GAUZE/BANDAGES/DRESSINGS) IMPLANT
BIT DRILL WIRE PASS 1.3MM (BIT) IMPLANT
BLADE EYE SICKLE 84 5 BEAV (BLADE) ×2 IMPLANT
BLADE EYE SICKLE 84 5MM BEAV (BLADE) ×1
BLADE SAW GIGLI 16 STRL (MISCELLANEOUS) IMPLANT
BLADE SURG 15 STRL LF DISP TIS (BLADE) ×1 IMPLANT
BLADE SURG 15 STRL SS (BLADE) ×2
BLADE ULTRA TIP 2M (BLADE) IMPLANT
BNDG GAUZE ELAST 4 BULKY (GAUZE/BANDAGES/DRESSINGS) ×6 IMPLANT
BRUSH SCRUB EZ 1% IODOPHOR (MISCELLANEOUS) IMPLANT
BRUSH SCRUB EZ PLAIN DRY (MISCELLANEOUS) ×3 IMPLANT
BUR ACORN 6.0 PRECISION (BURR) ×2 IMPLANT
BUR ACORN 6.0MM PRECISION (BURR) ×1
BUR ADDG 1.1 (BURR) IMPLANT
BUR ADDG 1.1MM (BURR)
BUR MATCHSTICK NEURO 3.0 LAGG (BURR) IMPLANT
BUR ROUND FLUTED 4 SOFT TCH (BURR) ×2 IMPLANT
BUR ROUND FLUTED 4MM SOFT TCH (BURR) ×1
BUR ROUTER D-58 CRANI (BURR) IMPLANT
CANISTER SUCT 3000ML (MISCELLANEOUS) ×3 IMPLANT
CATH VENTRIC 35X38 W/TROCAR LG (CATHETERS) ×3 IMPLANT
CLIP ANEURY TBAR 90DEG 9MM (Clip) ×3 IMPLANT
CLIP TI MEDIUM 6 (CLIP) IMPLANT
CONT SPEC 4OZ CLIKSEAL STRL BL (MISCELLANEOUS) ×6 IMPLANT
COVER MAYO STAND STRL (DRAPES) IMPLANT
DECANTER SPIKE VIAL GLASS SM (MISCELLANEOUS) ×3 IMPLANT
DRAIN SNY WOU 7FLT (WOUND CARE) IMPLANT
DRAPE MICROSCOPE LEICA (MISCELLANEOUS) ×3 IMPLANT
DRAPE NEUROLOGICAL W/INCISE (DRAPES) ×3 IMPLANT
DRAPE WARM FLUID 44X44 (DRAPE) ×3 IMPLANT
DRILL WIRE PASS 1.3MM (BIT)
DRSG ADAPTIC 3X8 NADH LF (GAUZE/BANDAGES/DRESSINGS) ×3 IMPLANT
DRSG TELFA 3X8 NADH (GAUZE/BANDAGES/DRESSINGS) IMPLANT
DURAPREP 6ML APPLICATOR 50/CS (WOUND CARE) ×3 IMPLANT
ELECT CAUTERY BLADE 6.4 (BLADE) ×3 IMPLANT
ELECT REM PT RETURN 9FT ADLT (ELECTROSURGICAL) ×3
ELECTRODE REM PT RTRN 9FT ADLT (ELECTROSURGICAL) ×1 IMPLANT
EVACUATOR SILICONE 100CC (DRAIN) IMPLANT
GAUZE SPONGE 4X4 12PLY STRL (GAUZE/BANDAGES/DRESSINGS) ×3 IMPLANT
GAUZE SPONGE 4X4 16PLY XRAY LF (GAUZE/BANDAGES/DRESSINGS) IMPLANT
GLOVE BIOGEL M 6.5 STRL (GLOVE) ×9 IMPLANT
GLOVE BIOGEL PI IND STRL 7.0 (GLOVE) ×2 IMPLANT
GLOVE BIOGEL PI IND STRL 7.5 (GLOVE) ×1 IMPLANT
GLOVE BIOGEL PI INDICATOR 7.0 (GLOVE) ×4
GLOVE BIOGEL PI INDICATOR 7.5 (GLOVE) ×2
GLOVE ECLIPSE 6.5 STRL STRAW (GLOVE) ×3 IMPLANT
GLOVE ECLIPSE 7.0 STRL STRAW (GLOVE) ×6 IMPLANT
GLOVE EXAM NITRILE LRG STRL (GLOVE) IMPLANT
GLOVE EXAM NITRILE MD LF STRL (GLOVE) IMPLANT
GLOVE EXAM NITRILE XL STR (GLOVE) IMPLANT
GLOVE EXAM NITRILE XS STR PU (GLOVE) IMPLANT
GOWN STRL REUS W/ TWL LRG LVL3 (GOWN DISPOSABLE) ×3 IMPLANT
GOWN STRL REUS W/ TWL XL LVL3 (GOWN DISPOSABLE) IMPLANT
GOWN STRL REUS W/TWL 2XL LVL3 (GOWN DISPOSABLE) IMPLANT
GOWN STRL REUS W/TWL LRG LVL3 (GOWN DISPOSABLE) ×6
GOWN STRL REUS W/TWL XL LVL3 (GOWN DISPOSABLE)
HEMOSTAT POWDER KIT SURGIFOAM (HEMOSTASIS) ×3 IMPLANT
HEMOSTAT SURGICEL 2X14 (HEMOSTASIS) ×3 IMPLANT
KIT BASIN OR (CUSTOM PROCEDURE TRAY) ×3 IMPLANT
KIT DRAIN CSF ACCUDRAIN (MISCELLANEOUS) IMPLANT
KIT ROOM TURNOVER OR (KITS) ×3 IMPLANT
KNIFE ARACHNOID DISP AM-21-S (BLADE) ×3 IMPLANT
KNIFE ARACHNOID DISP AM-24-S (MISCELLANEOUS) ×3 IMPLANT
NEEDLE HYPO 25X1 1.5 SAFETY (NEEDLE) ×3 IMPLANT
NS IRRIG 1000ML POUR BTL (IV SOLUTION) ×3 IMPLANT
PACK CRANIOTOMY (CUSTOM PROCEDURE TRAY) ×3 IMPLANT
PAD ARMBOARD 7.5X6 YLW CONV (MISCELLANEOUS) ×3 IMPLANT
PATTIES SURGICAL .25X.25 (GAUZE/BANDAGES/DRESSINGS) IMPLANT
PATTIES SURGICAL .5 X.5 (GAUZE/BANDAGES/DRESSINGS) IMPLANT
PATTIES SURGICAL .5 X3 (DISPOSABLE) IMPLANT
PATTIES SURGICAL 1/4 X 3 (GAUZE/BANDAGES/DRESSINGS) IMPLANT
PATTIES SURGICAL 1X1 (DISPOSABLE) IMPLANT
PIN MAYFIELD SKULL DISP (PIN) ×3 IMPLANT
PLATE 1.5  2HOLE LNG NEURO (Plate) ×6 IMPLANT
PLATE 1.5 2HOLE LNG NEURO (Plate) ×3 IMPLANT
RUBBERBAND STERILE (MISCELLANEOUS) ×9 IMPLANT
SCREW SELF DRILL HT 1.5/4MM (Screw) ×18 IMPLANT
SPONGE NEURO XRAY DETECT 1X3 (DISPOSABLE) IMPLANT
SPONGE SURGIFOAM ABS GEL 100 (HEMOSTASIS) ×3 IMPLANT
SPONGE SURGIFOAM ABS GEL 100C (HEMOSTASIS) IMPLANT
STAPLER VISISTAT 35W (STAPLE) ×3 IMPLANT
STOCKINETTE 6  STRL (DRAPES) ×2
STOCKINETTE 6 STRL (DRAPES) ×1 IMPLANT
SUT ETHILON 3 0 FSL (SUTURE) IMPLANT
SUT NURALON 4 0 TR CR/8 (SUTURE) ×6 IMPLANT
SUT VIC AB 0 CT1 18XCR BRD8 (SUTURE) ×2 IMPLANT
SUT VIC AB 0 CT1 8-18 (SUTURE) ×4
SUT VICRYL 3-0 RB1 18 ABS (SUTURE) ×6 IMPLANT
SYR BULB IRRIGATION 50ML (SYRINGE) ×6 IMPLANT
SYR CONTROL 10ML LL (SYRINGE) ×3 IMPLANT
TAPE CLOTH 1X10 TAN NS (GAUZE/BANDAGES/DRESSINGS) ×3 IMPLANT
TOWEL OR 17X24 6PK STRL BLUE (TOWEL DISPOSABLE) ×3 IMPLANT
TOWEL OR 17X26 10 PK STRL BLUE (TOWEL DISPOSABLE) ×3 IMPLANT
TRAY FOLEY CATH 14FRSI W/METER (CATHETERS) IMPLANT
UNDERPAD 30X30 INCONTINENT (UNDERPADS AND DIAPERS) IMPLANT
WATER STERILE IRR 1000ML POUR (IV SOLUTION) ×3 IMPLANT

## 2014-07-27 NOTE — Progress Notes (Signed)
Pt transported from IR to Neuro OR on ventilator by RT.

## 2014-07-27 NOTE — Progress Notes (Signed)
A-line attempted by 2 RT's & all attempts were unsuccessful. CCM was made aware.

## 2014-07-27 NOTE — Progress Notes (Signed)
PT Cancellation Note  Patient Details Name: Stephanie Doyle MRN: [id number] DOB: 01/15/1951   Cancelled Treatment:    Reason Eval/Treat Not Completed: Patient not medically ready.  Pt currently on bedrest and noted Neuro plan for Angio this am.  Will hold PT at this time and follow-up as appropriate.     Roald Lukacs, Alison MurrayMegan F 07/27/2014, 7:25 AM

## 2014-07-27 NOTE — Progress Notes (Signed)
Pt transported to interventional radiology on vent. CRNA placed pt on their vent and will call RT when ready to transport back to 81M.

## 2014-07-27 NOTE — Brief Op Note (Signed)
PREOP DX: SAH  POSTOP DX: Same  PROCEDURE: Diagnostic cerebral angiogram  SURGEON: Dr. Lisbeth RenshawNeelesh Wilkin Lippy, MD  ANESTHESIA: GETA  EBL: Minimal  SPECIMENS: None  COMPLICATIONS: None  CONDITION: Hemodynamically stable to Neuro OR  FINDINGS: 1. Small bilobed Acom aneurysm projecting anteriorly and inferiorly 2. No vasospasm seen 3. Fetal-type left PCA

## 2014-07-27 NOTE — Op Note (Signed)
PREOP DIAGNOSIS: Anterior Communicating Artery Aneurysm  POSTOP DIAGNOSIS: Same  PROCEDURE: 1. Right pterional craniotomy for clipping of anterior communicating artery aneurysm, complex 2. Use of operating microscope for microdissection 3. Use of intraoperative ICG videoangiography   SURGEON: Dr. Lisbeth RenshawNeelesh Fabian Walder, MD  ASSISTANT: Dr. Coletta MemosKyle Cabbell, MD  ANESTHESIA: General Endotracheal  EBL: 150cc  SPECIMENS: None  DRAINS: None  COMPLICATIONS: None immediate  CONDITION: Hemodynamically stable to ICU  HISTORY: Stephanie Doyle is a 63 y.o. female who presented to the emergency department with sudden onset of severe headache, requiring intubation in the emergency department. Workup included CT scan of the brain which demonstrated subarachnoid hemorrhage, and diagnostic cerebral injury gram was performed which demonstrated a bilobed anterior communicating artery aneurysm as the only identifiable source of hemorrhage. This was not amenable to coil embolization, and the patient was therefore brought to the operating room for surgical clip ligation. The risks and benefits of the surgery were explained in detail to the patient's family. After all their questions were answered, consent was obtained.  PROCEDURE IN DETAIL: After informed consent was obtained and witnessed, the patient was brought to the operating room. After induction of general anesthesia, the patient was positioned on the operative table in the supine position. The 3-point Mayfield head holder was then applied to the patient and affixed to the table. All pressure points were meticulously padded. Standard curvilinear right-sided frontotemporal skin initiate incision was then marked out.  After time-out was conducted, The skin was infiltrated with local anesthetic without epinephrine, andskin incision was made sharply and Bovie electrocautery was used to dissect the subcutaneous tissue and galea. Raney clips were then used to secure  hemostasis on the skin edges. The superficial temporal artery was dissected free and retracted with the skin flap. Bovie electrocautery was used to dissect through the pericranium. The temporal fat pad was then identified, and the interfascial plane in the temporalis muscle was identified. The skin flap was then reflected anteriorly. The temporalis fascia was then incised and the temporalis muscle was retracted inferiorly. Burr holes were then created in the pterion, above the root of the zygoma, and the superior temporal line. These are then connected with the craniotome and a standard pterional craniotomy flap was elevated. Hemostasis was achieved on the bone edges, and a high-speed drill was used to drill down the lesser wing of the sphenoid.  The dura was then opened in cruciate fashion and good hemostasis was achieved on the dural edges. At this point the microscope was draped and brought into the field and the remainder of the case was done under the microscope using microdissection.  The rightOptic nerve was initially identified, and arachnoid dissection was carried out to identify the supraclinoid internal carotid artery, the internal carotid artery bifurcation, the proximal M1 segment, and the A1 segment from its origin to its entrance into the interhemispheric fissure. We did note a globular mass likely one dome of the aneurysm projecting onto the posterior portion of the right optic nerve. Dissection was carried out across the planum sphenoidale to identify the contralateral optic nerve, and the contralateral internal carotid artery and contralateral A1 segment were identified.  Bipolar electrocautery was used to coagulate the pia over the inferior frontal gyrus, and using a combination of bipolar electrocautery and suction, the gyrus rectus on the right side was removed. This allowed identification of the right-sided A2, and dissection in the interhemispheric fissure allowed identification of the  contralateral A2. It became apparent that the smaller, superiorly  projecting lobe of the aneurysm appeared more blisterlike, and was not amenable to direct clip ligation. The inferiorly projecting aneurysm lobe was fairly wide based, and attempt was made to place a straight clip across this aneurysm, unsuccessfully. The aneurysm clip was pushed to the dome of the aneurysm.  Further dissection posterior to the anterior [facility] artery complex was carried out, and a right angle fenestrated T-shaped clip was selected, and this was placed parallel to the A1 and A2 segments, with the right A1 A2 junction in the fenestration. This appeared to effectively occlude the anterior communicating artery, with both lobes of the aneurysm.  After clip was applied, 12.5 mg of ICG was then injected by anesthesia and video angiography was used to identify good flow in both A1's, and both A2's. There did not appear to be any flow in the aneurysm itself.  At this point, having completed the clipping, the wound is irrigated with copious amounts of normal saline irrigation. There is no identifiable bleeding from the brain. Dura was then closed using interrupted 4-0 Nurolon stitches. The wound was again irrigated, and hemostasis was achieved on the epidural surface using bipolar electrocautery and FloSeal. The bone flap was then replaced and plated with standard titanium plates and screws. The temporalis muscle was then reapproximated using interrupted 0 Vicryl stitches. The galea was closed using interrupted oh and 3-0 Vicryl stitches. The skin was then closed using standard surgical skin staples.  Sterile dressing was then applied after the Mayfield head holder was removed. The patient was then transferred to the stretcher and taken to the neuro ICU in stable hemodynamic condition.  At the end of the case all sponge, needle, instrument, and cottonoid counts were correct.

## 2014-07-27 NOTE — Progress Notes (Signed)
Pt remains hypotensive, map in 50s. Discussed with Dr. Kendrick FriesMcQuaid. Orders received.

## 2014-07-27 NOTE — Progress Notes (Signed)
eLink Physician-Brief Progress Note Patient Name: Stephanie SilvasVicky D Rozo DOB: May 12, 1951 MRN: [id number]   Date of Service  07/27/2014  HPI/Events of Note  Sinus brady. Suspect reflexive bradycardia due to PE  eICU Interventions  Change PE to NE     Intervention Category Major Interventions: Arrhythmia - evaluation and management  Billy FischerDavid Ankita Newcomer 07/27/2014, 11:13 PM

## 2014-07-27 NOTE — Progress Notes (Signed)
IR team here to get pt. [facility], Scientist, clinical (histocompatibility and immunogenetics)CRNA.

## 2014-07-27 NOTE — Progress Notes (Signed)
SLP Cancellation Note  Patient Details Name: Shelton SilvasVicky D Rueckert MRN: [id number] DOB: [birth year]   Cancelled treatment:       Reason Eval/Treat Not Completed: Patient not medically ready (Pt is mechanically ventilated at this time)   Barkley BrunsO'Brien, Katherine 07/27/2014, 7:26 AM

## 2014-07-27 NOTE — Progress Notes (Signed)
OT Cancellation Note  Patient Details Name: Stephanie Doyle MRN: [id number] DOB: 1951-08-04   Cancelled Treatment:    Reason Eval/Treat Not Completed: Patient not medically ready (BR). Planned for angio this AM. Ot to continue to follow acutely  [name], [name]  [phone] 07/27/2014, 8:11 AM

## 2014-07-27 NOTE — Transfer of Care (Signed)
Immediate Anesthesia Transfer of Care Note  Patient: Stephanie SilvasVicky D Doyle  Procedure(s) Performed: Procedure(s): CRANIOTOMY INTRACRANIAL ANEURYSM FOR Pterional Aneurysm (Right)  Patient Location: ICU  Anesthesia Type:General  Level of Consciousness: unresponsive  Airway & Oxygen Therapy: Patient placed on Ventilator (see vital sign flow sheet for setting)  Post-op Assessment: Report given to PACU RN and Post -op Vital signs reviewed and stable  Post vital signs: Reviewed and stable  Complications: No apparent anesthesia complications

## 2014-07-27 NOTE — Progress Notes (Signed)
CRITICAL VALUE ALERT  Critical value received:  K <2.2. Calcium 4.4  Date of notification:  07/27/2014  Time of notification:  0426  Critical value read back:Yes.    Nurse who received alert:  Emelia Salisburyarrie Jamont Mellin, RN  MD notified (1st page):  Dr. Audelia Hiveseterding, ELINK MD  Time of first page:  [phone]  MD notified (2nd page):  Time of second page:  Responding MD:  Dr. Darrick Pennaeterding  Time MD responded:  [phone]

## 2014-07-27 NOTE — Progress Notes (Signed)
BPs trending MAP in 50's. Unable to decrease propofol less than 20 mcg due to agitation. Discussed with Dr. Kendrick FriesMcQuaid.

## 2014-07-27 NOTE — Progress Notes (Signed)
Discussed BP with Dr. Kendrick FriesMcQuaid again, switching to fentanyl and adding 500cc bolus

## 2014-07-27 NOTE — Progress Notes (Signed)
SLP Cancellation Note  Patient Details Name: Stephanie Doyle MRN: [id number] DOB: 15-Apr-1951   Cancelled treatment:       Reason Eval/Treat Not Completed: Patient not medically ready. Intubated will follow for readiness.    [name], [name] 07/27/2014, 9:37 AM

## 2014-07-27 NOTE — Transfer of Care (Signed)
Immediate Anesthesia Transfer of Care Note  Patient: Stephanie Doyle  Procedure(s) Performed: * No procedures listed *  Patient Location: ICU  Anesthesia Type:General  Level of Consciousness: unresponsive  Airway & Oxygen Therapy: Patient remains intubated per anesthesia plan and Patient placed on Ventilator (see vital sign flow sheet for setting)  Post-op Assessment: Report given to PACU RN and Post -op Vital signs reviewed and stable  Post vital signs: Reviewed and stable  Complications: No apparent anesthesia complications

## 2014-07-27 NOTE — Progress Notes (Signed)
Lab with difficulty obtaining sample. Discussed with lab, phlebotomy and Dr. Kendrick FriesMcQuaid.

## 2014-07-27 NOTE — Anesthesia Procedure Notes (Signed)
Procedures

## 2014-07-27 NOTE — Anesthesia Preprocedure Evaluation (Addendum)
Anesthesia Evaluation  Patient identified by MRN, date of birth, ID band Patient unresponsive    Reviewed: Allergy & Precautions, H&P , NPO status , Patient's Chart, lab work & pertinent test results  Airway        Dental   Pulmonary COPD oxygen dependent,  COPD 2nd hand smoke, PM oxygen RX at home,          Cardiovascular  Family denies HTN.  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  Family states patient had visual disturbances last week, flashing sensation, could be related    GI/Hepatic   Endo/Other    Renal/GU      Musculoskeletal   Abdominal   Peds  Hematology  (+) anemia , Waiting for repeat CBC, suspect last was error, repeat CBC 9/29   Anesthesia Other Findings   Reproductive/Obstetrics                          Anesthesia Physical Anesthesia Plan  ASA: III  Anesthesia Plan: General   Post-op Pain Management:    Induction: Intravenous  Airway Management Planned: Oral ETT  Additional Equipment: Arterial line  Intra-op Plan:   Post-operative Plan:   Informed Consent: I have reviewed the patients History and Physical, chart, labs and discussed the procedure including the risks, benefits and alternatives for the proposed anesthesia with the patient or authorized representative who has indicated his/her understanding and acceptance.     Plan Discussed with:   Anesthesia Plan Comments: (HX not obtainable from patient.  Will need art line)        Anesthesia Quick Evaluation

## 2014-07-27 NOTE — Progress Notes (Signed)
No issues overnight. Pt remains intubated, sedated  PMH reviewed with patient's family. Has hx of severe COPD, no other known hx of HTN/DM, MI, CVA, or known use of antiplatelet / anticoagulant  FH significant for mother and aunt with cerebral aneurysms.  EXAM:  BP 100/41 mmHg  Pulse 72  Temp(Src) 100.2 F (37.9 C) (Axillary)  Resp 15  Ht 5' (1.524 m)  Wt 46.7 kg (102 lb 15.3 oz)  BMI 20.11 kg/m2  SpO2 99%  Intubated, sedated  CTA: Diffuse SAH with thick interhemispheric clot seen. Minimal dependant occipital IVH. No HCP. Apparent bilobed Acom aneurysm seen. Incidental note of fetal-type left PCA.  IMPRESSION:  63 y.o. female SAHd#1 presenting as H&H 3, Fisher 3/4 (slight occipital IVH seen) Likely Acom aneurysm as source of rupture  PLAN: - Proceed with diagnostic angiogram and aneurysm treatment based on angiographic findings (clipping vs coiling)  I spoke at length with the patient's family regarding the imaging findings thus far. I explained to them that intracranial aneurysm was the most common non-traumatic cause for [facility] and that the definitive diagnosis is made by diagnostic angiogram. I also explained to them the possible treatment options for intracranial aneurysms including endovascular coiling and open clip ligation. The risks of the angiogram, coiling, and surgical clipping were also reviewed to include stroke and aneurysm re-rupture leading to weakness/paralysis/coma/death, infection, SZ, hydrocephalus. I also talked to them about the possibility of ventriculostomy for CSF drainage and the risks of this procedure.  The patient's family understood our discussion and the provided consent to proceed with diagnostic angiogram and the appropriate treatment for any identified aneurysm.

## 2014-07-27 NOTE — Anesthesia Preprocedure Evaluation (Addendum)
Anesthesia Evaluation  Patient identified by MRN, date of birth, ID band Patient unresponsive    Reviewed: Allergy & Precautions, H&P , NPO status , Patient's Chart, lab work & pertinent test results  Airway       Comment: Intubated- ETT 22 at teeth- bilat BS [facility]   Pulmonary COPD oxygen dependent,  COPD 2nd hand smoke, PM oxygen RX at home,          Cardiovascular  Family denies HTN.  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  Family states patient had visual disturbances last week, flashing sensation, could be related    GI/Hepatic   Endo/Other    Renal/GU      Musculoskeletal   Abdominal   Peds  Hematology  (+) anemia , Waiting for repeat CBC, suspect last was error, repeat CBC 9/29   Anesthesia Other Findings   Reproductive/Obstetrics                            Anesthesia Physical  Anesthesia Plan  ASA: IV  Anesthesia Plan: General   Post-op Pain Management:    Induction: Intravenous  Airway Management Planned: Oral ETT  Additional Equipment: Arterial line, CVP and Ultrasound Guidance Line Placement  Intra-op Plan:   Post-operative Plan: Post-operative intubation/ventilation  Informed Consent: I have reviewed the patients History and Physical, chart, labs and discussed the procedure including the risks, benefits and alternatives for the proposed anesthesia with the patient or authorized representative who has indicated his/her understanding and acceptance.     Plan Discussed with:   Anesthesia Plan Comments: (Unable to coil aneurysm. Proceeding with craniotomy for aneurysm clipping.)        Anesthesia Quick Evaluation

## 2014-07-27 NOTE — Progress Notes (Signed)
Lab back again to re draw CBC from 06:30 as sample "clotted". I will personally walk this specimen to lab as this is the third lab draw to occur since 0700. PT still pending.

## 2014-07-27 NOTE — Progress Notes (Signed)
PULMONARY / CRITICAL CARE MEDICINE   Name: Stephanie Doyle MRN: [id number] DOB: April 19, 1951    ADMISSION DATE:  07/26/2014 CONSULTATION DATE:  07/26/2014  REFERRING MD :  Dr. Franky Machoabbell  CHIEF COMPLAINT:  Headache  INITIAL PRESENTATION:  63 yo [facility]  Intubated for airway protection.  [facility]  STUDIES:  11/2 CT head >> diffuse SAH, frontal and temporal bilaterally.  11/2 CTA head >> 3 x 3 x 3 mm focal outpouching extending from the anterior communicating artery  SUBJECTIVE:  Plan for coiling later this AM.  VITAL SIGNS: Temp:  [98.6 F (37 C)-100 F (37.8 C)] 98.6 F (37 C) (11/03 0400) Pulse Rate:  [73-109] 75 (11/03 0530) Resp:  [14-20] 15 (11/03 0530) BP: (92-180)/(39-85) 95/39 mmHg (11/03 0530) SpO2:  [100 %] 100 % (11/03 0530) FiO2 (%):  [40 %] 40 % (11/03 0530) Weight:  [102 lb 15.3 oz (46.7 kg)] 102 lb 15.3 oz (46.7 kg) (11/02 1952) VENTILATOR SETTINGS: Vent Mode:  [-] PRVC FiO2 (%):  [40 %] 40 % Set Rate:  [15 bmp] 15 bmp Vt Set:  [370 mL] 370 mL PEEP:  [5 cmH20] 5 cmH20 Plateau Pressure:  [12 cmH20-13 cmH20] 12 cmH20 INTAKE / OUTPUT:  Intake/Output Summary (Last 24 hours) at 07/27/14 0618 Last data filed at 07/27/14 0500  Gross per 24 hour  Intake 958.92 ml  Output    700 ml  Net 258.92 ml    PHYSICAL EXAMINATION: General: no distress Neuro: RASS -2, moves extremities spontaneously, not following commands HEENT: ETT in place, pupils pinpoint weakly reactive Cardiovascular: regular Lungs: no wheeze Abdomen:  Soft, non tender Musculoskeletal:  No edema Skin: no rashes  LABS:  CBC  Recent Labs Lab 07/26/14 2148 07/27/14 0256  WBC 19.6* 9.1  HGB 10.3* 6.0*  HCT 33.2* 18.9*  PLT 163 PENDING   Coag's  Recent Labs Lab 07/26/14 2114  APTT 42*  INR 2.52*   BMET  Recent Labs Lab 07/26/14 2349 07/27/14 0256  NA 171* 146  K <2.2* <2.2*  CL 116* 120*  CO2  15* 16*  BUN 7 8  CREATININE 0.50 0.54  GLUCOSE 78 72   Electrolytes  Recent Labs Lab 07/26/14 2349 07/27/14 0256  CALCIUM 4.2* 4.4*   ABG  Recent Labs Lab 07/26/14 2308  PHART 7.434  PCO2ART 43.8  PO2ART 156.0*   Glucose  Recent Labs Lab 07/26/14 2328 07/27/14 0345  GLUCAP 124* 109*    Imaging Ct Angio Head W/cm &/or Wo Cm  07/26/2014   EXAM: CT ANGIOGRAPHY HEAD  TECHNIQUE: Multidetector CT imaging of the head was performed using the standard protocol during bolus administration of intravenous contrast. Multiplanar CT image reconstructions and MIPs were obtained to evaluate the vascular anatomy.  CONTRAST:  50mL OMNIPAQUE IOHEXOL 350 MG/ML SOLN  COMPARISON:  Prior noncontrast head CT from earlier the same day.  FINDINGS: ANTERIOR CIRCULATION:  The visualized portions of the distal cervical segments of the internal carotid arteries are widely patent with antegrade flow. Petrous segments within normal limits. Mild scattered atheromatous plaque present within the cavernous segments bilaterally without hemodynamically significant stenosis. A1 segments patent bilaterally. Delete that  There is a focal irregular outpouching measuring approximately 3 x 3 x 3 mm extending from the anterior communicating artery, at the confluence of the right A1 segment and knee A-comm. This is best seen on coronal sequence (series 503, image 83). This outpouching is directed inferiorly  and slightly posteriorly. Finding is suspicious for aneurysm.  Anterior cerebral arteries are well opacified distally without abnormality.  Middle cerebral artery is well opacified without hemodynamically significant stenosis or occlusion. MCA branches within normal limits. No aneurysm seen at the MCA bifurcations.  POSTERIOR CIRCULATION:  The left vertebral artery is dominant. Posterior inferior cerebellar arteries well opacified. Vertebrobasilar junction within normal limits. Basilar artery is mildly tortuous without  high-grade stenosis. No basilar tip aneurysm. Fetal origin of the left PCA noted. Small posterior communicating arteries seen on the right. Posterior cerebral artery is well opacified bilaterally. Superior cerebral arteries within normal limits.  IMPRESSION: 1. 3 x 3 x 3 mm focal outpouching extending from the anterior communicating artery, suspicious for ruptured aneurysm. This is directed inferiorly and slightly posteriorly. 2. No other aneurysm or vascular malformation identified within the intracranial circulation to account for subarachnoid hemorrhage. 3. Grossly stable subarachnoid hemorrhage. 4. Fetal origin of the left PCA.  Critical Value/emergent results were called by telephone at the time of interpretation on 07/26/2014 at 10:28 pm to Dr. Coletta MemosKYLE CABBELL , who verbally acknowledged these results.   Electronically Signed   By: Rise MuBenjamin  McClintock M.D.   On: 07/26/2014 22:42   Portable Chest Xray  07/26/2014   CLINICAL DATA:  Acute respiratory failure.  EXAM: PORTABLE CHEST - 1 VIEW  COMPARISON:  07/26/2014  FINDINGS: Endotracheal tube tip measures 3.5 cm above the carinal. Enteric tube tip is not visualized off the field of view but is below the left hemidiaphragm. Slightly shallow inspiration. Heart size and pulmonary vascularity are normal. No focal airspace disease or consolidation in the lungs. Focal nodular opacity in the left lung base measures 6.7 mm. This corresponds to 1 of the nodules demonstrated on previous chest CT from 05/10/2014. Mediastinal contours appear intact. No pneumothorax. No blunting of costophrenic angles.  IMPRESSION: Appliances appear in satisfactory position. No evidence of active pulmonary disease. Left lung base nodule as previously identified at CT.   Electronically Signed   By: Burman NievesWilliam  Stevens M.D.   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 P:   F/u TCD RASS goal -1  Keppra for seizure  prophylaxis per neurosurgery Continue nimotop Consider starting statin Tentative plan for coiling on 11/03 >> if unsuccessful, then she might need surgical intervention  PULMONARY ETT 11/2 >>> A: Acute hypoxemic respiratory failure 2nd to impaired airway protection and concern for aspiration. P:   Full vent support until more stable F/u CXR  CARDIOVASCULAR A:  Elevated blood pressure on admission. P:  Continuous cardiac monitoring PRN hydralazine, labetalol  Keep SBP < 160 per neurosurgery  RENAL A:   ?hypernatremia/hypokalemia 11/03 >> I do not think these are accurate P:   Repeat Bmet  GASTROINTESTINAL A:   Nutrition. P:   Tube feeds while on vent after coiling Protonix for SUP  HEMATOLOGIC A:   Anemia 11/03 >> uncertain about accuracy of lab draws. P:  F/u CBC, coag's  INFECTIOUS A:   Possible aspiration PNA. P:   Day 2 of unasyn, started 11/02  Blood 11/03 >> Sputum 11/03 >> Urine 11/03 >>  ENDOCRINE A:   No acute issues. P:   SSI while no tube feeds.  FAMILY  - Updates:   - Inter-disciplinary family meet or Palliative Care meeting due by:  11/9  CC time 40 minutes.  Coralyn HellingVineet Kolby Myung, MD [facility] 07/27/2014, 6:29 AM Pager:  [phone] After 3pm call: [phone]

## 2014-07-27 NOTE — Progress Notes (Signed)
INITIAL NUTRITION ASSESSMENT  DOCUMENTATION CODES Per approved criteria  -Not Applicable   INTERVENTION: Recommend Initiate Vital AF 1.2 @ 20 ml/hr via OG tube and increase by 10 ml every 4 hours to goal rate of 40 ml/hr.   Tube feeding regimen provides 1152 kcal (105% of needs), 72 grams of protein, and 778 ml of H2O.   NUTRITION DIAGNOSIS: Inadequate oral intake related to inability to eat as evidenced by NPO status  Goal: Pt to meet >/= 90% of their estimated nutrition needs   Monitor:  Respiratory status, TF initiation and tolerance, weight trend  Reason for Assessment: Ventilator  63 y.o. female  Admitting Dx: SAH (subarachnoid hemorrhage)  ASSESSMENT: Pt with hx of severe COPD [facility]. Per MD note plan to start TF after intervention.  Pt in radiology for angiogram and possible clipping vs coiling. No family present.   Pt had been on propofol however per RN note pt with hypotension with MAP in the 50's therefore sedation changed from propofol to fentanyl.  Patient is currently intubated on ventilator support MV: 5.8 L/min Temp (24hrs), Avg:99.1 F (37.3 C), Min:98.1 F (36.7 C), Max:100.2 F (37.9 C)  Propofol: off    Height: Ht Readings from Last 1 Encounters:  07/26/14 5' (1.524 m)    Weight: Wt Readings from Last 1 Encounters:  07/26/14 102 lb 15.3 oz (46.7 kg)    Ideal Body Weight: 45.4 kg   % Ideal Body Weight: 103%  Wt Readings from Last 10 Encounters:  07/26/14 102 lb 15.3 oz (46.7 kg)    Usual Body Weight: unknown  % Usual Body Weight: -  BMI:  Body mass index is 20.11 kg/(m^2).  Estimated Nutritional Needs: Kcal: 1097 Protein: 65-80 grams Fluid: > 1.5 L/day  Skin: WDL  Diet Order: Diet NPO time specified  EDUCATION NEEDS: -No education needs identified at this time   Intake/Output Summary (Last 24 hours) at 07/27/14 1327 Last data filed at 07/27/14 1100  Gross per 24 hour  Intake 1044.52 ml  Output    890 ml   Net 154.52 ml    Last BM: PTA   Labs:   Recent Labs Lab 07/26/14 2349 07/27/14 0256 07/27/14 0542  NA 171* 146 140  K <2.2* <2.2* 3.9  CL 116* 120* 102  CO2 15* 16* 27  BUN 7 8 13   CREATININE 0.50 0.54 0.99  CALCIUM 4.2* 4.4* 8.5  GLUCOSE 78 72 141*    CBG (last 3)   Recent Labs  07/27/14 0345 07/27/14 0757 07/27/14 1208  GLUCAP 109* 126* 100*    Scheduled Meds: . ampicillin-sulbactam (UNASYN) IV  1.5 g Intravenous Q6H  . antiseptic oral rinse  7 mL Mouth Rinse QID  . chlorhexidine  15 mL Mouth Rinse BID  . insulin aspart  2-6 Units Subcutaneous 6 times per day  . levETIRAcetam  500 mg Intravenous Q12H  . niMODipine  60 mg Oral Q4H   Or  . NiMODipine  60 mg Per Tube Q4H  . pantoprazole sodium  40 mg Per Tube Q1200  . senna-docusate  1 tablet Oral BID    Continuous Infusions: . sodium chloride 80 mL/hr at 07/27/14 0600  . fentaNYL infusion INTRAVENOUS 25 mcg/hr (07/27/14 1315)  . phenylephrine (NEO-SYNEPHRINE) Adult infusion 20 mcg/min (07/27/14 1303)    History reviewed. No pertinent past medical history.  No past surgical history on file.  Kendell BaneHeather Mayson Sterbenz RD, LDN, CNSC [phone] Pager [phone] After Hours Pager

## 2014-07-28 ENCOUNTER — Encounter (HOSPITAL_COMMUNITY): Payer: Self-pay

## 2014-07-28 ENCOUNTER — Inpatient Hospital Stay (HOSPITAL_COMMUNITY): Payer: 59

## 2014-07-28 DIAGNOSIS — D649 Anemia, unspecified: Secondary | ICD-10-CM

## 2014-07-28 DIAGNOSIS — I059 Rheumatic mitral valve disease, unspecified: Secondary | ICD-10-CM

## 2014-07-28 LAB — GLUCOSE, CAPILLARY
GLUCOSE-CAPILLARY: 139 mg/dL — AB (ref 70–99)
GLUCOSE-CAPILLARY: 116 mg/dL — AB (ref 70–99)
Glucose-Capillary: 135 mg/dL — ABNORMAL HIGH (ref 70–99)
Glucose-Capillary: 143 mg/dL — ABNORMAL HIGH (ref 70–99)
Glucose-Capillary: 168 mg/dL — ABNORMAL HIGH (ref 70–99)
Glucose-Capillary: 184 mg/dL — ABNORMAL HIGH (ref 70–99)

## 2014-07-28 LAB — CBC
HEMATOCRIT: 29.8 % — AB (ref 36.0–46.0)
Hemoglobin: 9.5 g/dL — ABNORMAL LOW (ref 12.0–15.0)
MCH: 27.6 pg (ref 26.0–34.0)
MCHC: 31.9 g/dL (ref 30.0–36.0)
MCV: 86.6 fL (ref 78.0–100.0)
Platelets: 183 10*3/uL (ref 150–400)
RBC: 3.44 MIL/uL — AB (ref 3.87–5.11)
RDW: 14.1 % (ref 11.5–15.5)
WBC: 30.4 10*3/uL — AB (ref 4.0–10.5)

## 2014-07-28 LAB — URINE CULTURE
Colony Count: NO GROWTH
Culture: NO GROWTH

## 2014-07-28 LAB — URINE MICROSCOPIC-ADD ON

## 2014-07-28 LAB — URINALYSIS, ROUTINE W REFLEX MICROSCOPIC
BILIRUBIN URINE: NEGATIVE
Glucose, UA: NEGATIVE mg/dL
Ketones, ur: NEGATIVE mg/dL
Leukocytes, UA: NEGATIVE
NITRITE: NEGATIVE
PH: 5 (ref 5.0–8.0)
Protein, ur: NEGATIVE mg/dL
SPECIFIC GRAVITY, URINE: 1.036 — AB (ref 1.005–1.030)
UROBILINOGEN UA: 0.2 mg/dL (ref 0.0–1.0)

## 2014-07-28 LAB — TRIGLYCERIDES: Triglycerides: 85 mg/dL (ref <150)

## 2014-07-28 LAB — BASIC METABOLIC PANEL
Anion gap: 11 (ref 5–15)
BUN: 9 mg/dL (ref 6–23)
CHLORIDE: 114 meq/L — AB (ref 96–112)
CO2: 19 meq/L (ref 19–32)
CREATININE: 0.85 mg/dL (ref 0.50–1.10)
Calcium: 7.5 mg/dL — ABNORMAL LOW (ref 8.4–10.5)
GFR calc Af Amer: 82 mL/min — ABNORMAL LOW (ref >90)
GFR calc non Af Amer: 70 mL/min — ABNORMAL LOW (ref >90)
Glucose, Bld: 165 mg/dL — ABNORMAL HIGH (ref 70–99)
Potassium: 3.6 mEq/L — ABNORMAL LOW (ref 3.7–5.3)
Sodium: 144 mEq/L (ref 137–147)

## 2014-07-28 LAB — TROPONIN I: Troponin I: <0.3 ng/mL (ref <0.30)

## 2014-07-28 MED ORDER — POTASSIUM CHLORIDE 10 MEQ/50ML IV SOLN
10.0000 meq | INTRAVENOUS | Status: AC
  Administered 2014-07-28 (×2): 10 meq via INTRAVENOUS
  Filled 2014-07-28 (×2): qty 50

## 2014-07-28 MED ORDER — SODIUM CHLORIDE 0.9 % IV SOLN: INTRAVENOUS | Status: DC | PRN

## 2014-07-28 MED ORDER — VITAL HIGH PROTEIN PO LIQD
1000.0000 mL | ORAL | Status: DC
  Administered 2014-07-28 (×2): 1000 mL
  Administered 2014-07-29 (×2)
  Administered 2014-07-29: 1000 mL
  Administered 2014-07-29 (×2)
  Administered 2014-07-30 – 2014-08-02 (×4): 1000 mL
  Filled 2014-07-28 (×10): qty 1000

## 2014-07-28 MED ORDER — PIPERACILLIN-TAZOBACTAM 3.375 G IVPB
3.3750 g | Freq: Three times a day (TID) | INTRAVENOUS | Status: DC
  Administered 2014-07-28 – 2014-08-04 (×21): 3.375 g via INTRAVENOUS
  Filled 2014-07-28 (×25): qty 50

## 2014-07-28 MED ORDER — VANCOMYCIN HCL IN DEXTROSE 1-5 GM/200ML-% IV SOLN
1000.0000 mg | INTRAVENOUS | Status: DC
  Administered 2014-07-29 – 2014-08-04 (×7): 1000 mg via INTRAVENOUS
  Filled 2014-07-28 (×7): qty 200

## 2014-07-28 MED ORDER — VANCOMYCIN HCL IN DEXTROSE 1-5 GM/200ML-% IV SOLN
1000.0000 mg | Freq: Once | INTRAVENOUS | Status: AC
  Administered 2014-07-28: 1000 mg via INTRAVENOUS
  Filled 2014-07-28: qty 200

## 2014-07-28 NOTE — Progress Notes (Signed)
[facility] P[facility]M  CVP 12 Does not appear septic Check echo, troponin x2 Cancel blood transfusion as Hgb normal  Heber CarolinaBrent Okey Zelek, MD [facility] Pager: [phone] Cell: [phone] If no response, call [phone]

## 2014-07-28 NOTE — Progress Notes (Signed)
PULMONARY / CRITICAL CARE MEDICINE   Name: Stephanie Doyle MRN: [id number] DOB: 1951/07/16    ADMISSION DATE:  07/26/2014 CONSULTATION DATE:  07/26/2014  REFERRING MD :  Dr. Christella Noa  CHIEF COMPLAINT:  Headache  INITIAL PRESENTATION: 63 year old female with no known medical history presented to [facility] 11/2 with severe headache, found to have SAH. AMS in ED requiring intubation. She was transferred to [facility] for further eval. PCCM consulted for vent and ICU management.  STUDIES:  11/2 CT head > diffuse SAH, frontal and temporal bilaterally.  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; no aneurysm in intracranial circulation; stable [facility] 11/4 CT head >>  SIGNIFICANT EVENTS: 11/[facility] Intubated for airway protection, transferred to [facility] NICU 11/4- Right pterional craniotomy for clipping of anterior communicating artery aneurysm, complex   SUBJECTIVE: No acute events, hypotensive, on levophed  VITAL SIGNS: Temp:  [97.4 F (36.3 C)-100.2 F (37.9 C)] 97.4 F (36.3 C) (11/04 0400) Pulse Rate:  [46-113] 52 (11/04 0325) Resp:  [15-24] 16 (11/04 0325) BP: (89-152)/(35-59) 152/54 mmHg (11/04 0325) SpO2:  [93 %-100 %] 100 % (11/04 0325) Arterial Line BP: (71-152)/(40-56) 150/54 mmHg (11/04 0300) FiO2 (%):  [30 %-40 %] 30 % (11/04 0325) HEMODYNAMICS:   VENTILATOR SETTINGS: Vent Mode:  [-] PRVC FiO2 (%):  [30 %-40 %] 30 % Set Rate:  [15 bmp] 15 bmp Vt Set:  [370 mL] 370 mL PEEP:  [5 cmH20] 5 cmH20 Plateau Pressure:  [13 cmH20-14 cmH20] 13 cmH20 INTAKE / OUTPUT:  Intake/Output Summary (Last 24 hours) at 07/28/14 0407 Last data filed at 07/28/14 0300  Gross per 24 hour  Intake 3923.43 ml  Output   2020 ml  Net 1903.43 ml    PHYSICAL EXAMINATION: General:  On vent, stirs to touch HEENT: scalp dressing in place, bruising R eye, ett PULM: CTA B CV RRR no mgr Ab: BS+, soft, nontender Ext: warm, no edema Neuro: stirs to touch, sedated  LABS:  CBC  Recent Labs Lab 07/26/14 2148  07/27/14 0256 07/27/14 0945  WBC 19.6* 9.1 16.8*  HGB 10.3* 6.0* 9.6*  HCT 33.2* 18.9* 29.9*  PLT 163 102* 141*   Coag's  Recent Labs Lab 07/26/14 2114 07/27/14 0925  APTT 42*  --   INR 2.52* 1.19   BMET  Recent Labs Lab 07/26/14 2349 07/27/14 0256 07/27/14 0542  NA 171* 146 140  K <2.2* <2.2* 3.9  CL 116* 120* 102  CO2 15* 16* 27  BUN _0 CREATININE 0.50 0.54 0.99  GLUCOSE 78 72 141*   Electrolytes  Recent Labs Lab 07/26/14 2349 07/27/14 0256 07/27/14 0542  CALCIUM 4.2* 4.4* 8.5   Sepsis Markers No results for input(s): LATICACIDVEN, PROCALCITON, O2SATVEN in the last 168 hours. ABG  Recent Labs Lab 07/26/14 2308  PHART 7.434  PCO2ART 43.8  PO2ART 156.0*   Liver Enzymes No results for input(s): AST, ALT, ALKPHOS, BILITOT, ALBUMIN in the last 168 hours. Cardiac Enzymes No results for input(s): TROPONINI, PROBNP in the last 168 hours. Glucose  Recent Labs Lab 07/26/14 2328 07/27/14 0345 07/27/14 0757 07/27/14 1208 07/27/14 2049 07/28/14 0001  GLUCAP 124* 109* 126* 100* 183* 168*    Imaging Dg Chest Port 1 View  07/27/2014   CLINICAL DATA:  Central line placement  EXAM: PORTABLE CHEST - 1 VIEW  COMPARISON:  07/27/2014  FINDINGS: Endotracheal tube tip measures 4 cm above the carinal. Enteric tube tip is off the field of view but below the left  hemidiaphragm. Right central venous catheter tip is over the mid SVC region. No pneumothorax. Lungs are clear. Heart size and pulmonary vascularity normal.  IMPRESSION: Appliances appear in satisfactory location.   Electronically Signed   By: Lucienne Capers M.D.   On: 07/27/2014 22:10   Portable Chest Xray  07/27/2014   CLINICAL DATA:  Acute respiratory failure.  EXAM: PORTABLE CHEST - 1 VIEW  COMPARISON:  07/26/2014  FINDINGS: Endotracheal tube tip is 2.5 cm above the carina. The orogastric tube reaches the stomach.  Normal heart size. Upper mediastinal contours are distorted for rightward rotation.   Large lung volumes. There is no edema, consolidation, effusion, or pneumothorax. 6-7 mm pulmonary nodule at the peripheral left base which is known based on chest CT from 05/10/2014. Followup has already been scheduled for 11/11/2014.  IMPRESSION: 1. Endotracheal and orogastric tubes remain in good position. 2. No edema or pneumonia.   Electronically Signed   By: Jorje Guild M.D.   On: 07/27/2014 07:47   Dg Abd Portable 1v  07/27/2014   CLINICAL DATA:  OG tube placement.  EXAM: PORTABLE ABDOMEN - 1 VIEW  COMPARISON:  Chest 07/26/2014  FINDINGS: Enteric tube was placed with tip projected over the left lower abdomen consistent with location in the distal stomach. Residual contrast material in the urinary tract. Visualized bowel gas pattern is normal.  IMPRESSION: Enteric tube tip localizes to the left lower abdomen suggesting likely location in the distal stomach.   Electronically Signed   By: Lucienne Capers M.D.   On: 07/27/2014 00:46     ASSESSMENT / PLAN:  NEUROLOGIC A:   Subarachnoid hemorrhage ACA aneurysm > now s/p surgical clipping P:   Management per Neurosurgery RASS goal: 0 to -1 Fentanyl gtt for analgesia/sedation Nimodipine Daily WUA  PULMONARY OETT 11/2 >>> A: Acute hypoxemic respiratory failure 2nd to impaired airway protection ? Aspiration, CXR clear 11/4  P:   Full vent support F/u CXR and ABG Will plan for SBT and possible extubation today assuming CT head OK PRN BD's  CARDIOVASCULAR A:  Shock > uncertain source P:  CVP monitoring Continue levophed for MAP > 65 Continuous cardiac monitoring Continue IVF  RENAL A:   No active issues P:   Monitor BMET and UOP Replace electrolytes as needed  GASTROINTESTINAL A:   Nutrition GI prophylaxis P:   Tube feedings if not extubated SUP: IV pantoprazole   HEMATOLOGIC A:   Anemia without bleeding P:  Monitor for bleeding Intermittent CBC  INFECTIOUS A:   Doubt aspiration pneumonia P:   BCx2  11/2 >>> UC 11/2 >>> Sputum 11/2 >>> Abx: d/c unasyn, monitor for fever  ENDOCRINE A:   No acute issues  P:   Follow CBG SSI   FAMILY  - Updates: Met sister and brother in law at bedside on 11/3  - Inter-disciplinary family meet or Palliative Care meeting due by:  11/9  TODAY'S SUMMARY: Subarachnoid hemorrhage, now s/p surgical clipping. Don't see pneumonia so will stop unasyn.  Will consider extubation today depending on CT result, neurologic status.  CC time 40 minutes  Roselie Awkward, MD [facility]M Pager: [phone] Cell: [phone] If no response, call [phone]

## 2014-07-28 NOTE — Progress Notes (Signed)
OT Cancellation Note  Patient Details Name: Stephanie Doyle MRN: [id number] DOB: [birth year]   Cancelled Treatment:    Reason Eval/Treat Not Completed: Patient not medically ready Bedrest  11/4- Right pterional craniotomy for clipping of anterior communicating artery aneurysm, complex  [name], [name]  Pager: [phone]  07/28/2014, 7:46 AM

## 2014-07-28 NOTE — Progress Notes (Signed)
  Echocardiogram 2D Echocardiogram has been performed.  Stephanie Doyle, Stephanie Doyle M 07/28/2014, 8:48 AM

## 2014-07-28 NOTE — Plan of Care (Signed)
Problem: Consults Goal: Skin Care Protocol Initiated - if Braden Score 18 or less If consults are not indicated, leave blank or document N/A  Outcome: Completed/Met Date Met:  07/28/14 Goal: Nutrition Consult-if indicated Outcome: Completed/Met Date Met:  07/28/14 Tube feeds started 07/28/14 Goal: Diabetes Guidelines if Diabetic/Glucose > 140 If diabetic or lab glucose is > 140 mg/dl - Initiate Diabetes/Hyperglycemia Guidelines & Document Interventions  Outcome: Completed/Met Date Met:  07/28/14

## 2014-07-28 NOTE — Progress Notes (Signed)
Transcranial Doppler  Date POD PCO2 HCT BP  MCA ACA PCA OPHT SIPH VERT Basilar  07/28/14 MS   29.8 108/46 Right  Left   105  105   -69  -91   66  32   16  10   *  73   -27  -32   *  *         Right  Left                                            Right  Left                                             Right  Left                                             Right  Left                                            Right  Left                                            Right  Left                                        MCA = Middle Cerebral Artery      OPHT = Opthalmic Artery     BASILAR = Basilar Artery   ACA = Anterior Cerebral Artery     SIPH = Carotid Siphon PCA = Posterior Cerebral Artery   VERT = Verterbral Artery                   Normal MCA = 62+\-12 ACA = 50+\-12 PCA = 42+\-23   *Unable to insonate  07/28/2014 1:45 PM Navil Kole, RVT, RDCS, RDMS

## 2014-07-28 NOTE — Progress Notes (Signed)
eLink Physician-Brief Progress Note Patient Name: Stephanie SilvasVicky D Doyle DOB: August 05, 1951 MRN: [id number]   Date of Service  07/28/2014  HPI/Events of Note  A line not fxning well, no wave form, not piulling back On low dose levo, last PH WNL  eICU Interventions  Dc a line     Intervention Category Minor Interventions: Routine modifications to care plan (e.g. PRN medications for pain, fever)  Nelda BucksFEINSTEIN,Marijo Quizon J. 07/28/2014, 11:24 PM

## 2014-07-28 NOTE — Progress Notes (Signed)
PT Cancellation Note  Patient Details Name: Stephanie SilvasVicky D Buys MRN: [id number] DOB: 19-Dec-1950   Cancelled Treatment:    Reason Eval/Treat Not Completed: Patient not medically ready.  Spoke with RN, will hold PT today and check back tomorrow.     Aiven Kampe, Alison MurrayMegan F 07/28/2014, 9:26 AM

## 2014-07-28 NOTE — Progress Notes (Signed)
NUTRITION FOLLOW-UP/CONSULT  INTERVENTION:  Initiate Vital High Protein @ 20 ml/hr via OG tube and increase by 10 ml every 4 hours to goal rate of 40 ml/hr.   Tube feeding regimen provides 960 kcal (98% of needs), 84 grams of protein, and 802 ml of H2O.   NUTRITION DIAGNOSIS: Inadequate oral intake related to inability to eat as evidenced by NPO status  Goal: Pt to meet >/= 90% of their estimated nutrition needs   Monitor:  Respiratory status, TF initiation and tolerance, weight trend  ASSESSMENT: [facility] with hx of severe COPD [facility] Per MD note plan to start TF after intervention.  Pt s/p angiogram and clipping. No family present.   Pressures better today on levophed. MAP > 70.  Patient is currently intubated on ventilator support MV: 7 L/min Temp (24hrs), Avg:97.6 F (36.4 C), Min:97.4 F (36.3 C), Max:98.1 F (36.7 C)  Propofol: off  Potassium low. Has OG tube in place.  Per RN pt will remain intubated today. Pt on fentanyl for sedation. Per RN WBC's have trended up and infection being ruled out.  Consult received for tube feeding. Needs re-estimated.   Nutrition Focused Physical Exam:  Subcutaneous Fat:  Orbital Region: NA Upper Arm Region: NA Thoracic and Lumbar Region: WDL  Muscle:  Temple Region: NA Clavicle Bone Region: WDL Clavicle and Acromion Bone Region: WDL Scapular Bone Region: WDL Dorsal Hand: NA Patellar Region: WDL Anterior Thigh Region: WDL Posterior Calf Region: WDL  Edema: facial   Height: Ht Readings from Last 1 Encounters:  07/26/14 5' (1.524 m)    Weight: Wt Readings from Last 1 Encounters:  07/26/14 102 lb 15.3 oz (46.7 kg)    BMI:  Body mass index is 20.11 kg/(m^2).  Estimated Nutritional Needs: Kcal: 984 Protein: 65-80 grams Fluid: > 1.5 L/day  Skin: WDL  Diet Order: Diet NPO time specified   Intake/Output Summary (Last 24 hours) at 07/28/14 0943 Last data filed at 07/28/14 0900  Gross per 24 hour   Intake 4947.3 ml  Output   2075 ml  Net 2872.3 ml    Last BM: PTA   Labs:   Recent Labs Lab 07/27/14 0256 07/27/14 0542 07/28/14 0430  NA 146 140 144  K <2.2* 3.9 3.6*  CL 120* 102 114*  CO2 16* 27 19  BUN 8 13 9   CREATININE 0.54 0.99 0.85  CALCIUM 4.4* 8.5 7.5*  GLUCOSE 72 141* 165*    CBG (last 3)   Recent Labs  07/28/14 0001 07/28/14 0355 07/28/14 0812  GLUCAP 168* 139* 116*    Scheduled Meds: . sodium chloride  10 mL/hr Intravenous Once  . antiseptic oral rinse  7 mL Mouth Rinse QID  . chlorhexidine  15 mL Mouth Rinse BID  . feeding supplement (VITAL HIGH PROTEIN)  1,000 mL Per Tube Q24H  . insulin aspart  2-6 Units Subcutaneous 6 times per day  . levETIRAcetam  500 mg Intravenous Q12H  . niMODipine  60 mg Oral Q4H   Or  . NiMODipine  60 mg Per Tube Q4H  . nitroPRUSSide  0.25-0.5 mcg/kg/min Intravenous To NeurOR  . pantoprazole sodium  40 mg Per Tube Q1200  . piperacillin-tazobactam (ZOSYN)  IV  3.375 g Intravenous Q8H  . senna-docusate  1 tablet Oral BID  . vancomycin  1,000 mg Intravenous Once  . [START ON 07/29/2014] vancomycin  1,000 mg Intravenous Q24H    Continuous Infusions: . sodium chloride 80 mL/hr at 07/28/14 0900  . fentaNYL infusion  INTRAVENOUS 25 mcg/hr (07/28/14 0900)  . norepinephrine (LEVOPHED) Adult infusion 2.5 mcg/min (07/28/14 0900)    Kendell BaneHeather Kairon Shock RD, LDN, CNSC [phone] Pager [phone] After Hours Pager

## 2014-07-28 NOTE — Progress Notes (Signed)
[facility] FOR AM LAB REPLACEMENT ONLY  The patient does apply for the [facility] Protocol based on the criteria listed below:   1. Is GFR >/= 40 ml/min? Yes.    Patient's GFR today is 70 2. Is urine output >/= 0.5 ml/kg/hr for the last 6 hours? Yes.   Patient's UOP is 1.5 ml/kg/hr 3. Is BUN < 60 mg/dL? Yes.    Patient's BUN today is 9 4. Abnormal electrolyte(s): Potassium 5. Ordered repletion with: Potassium per protocol    Eber Ferrufino P 07/28/2014 5:44 AM

## 2014-07-28 NOTE — Progress Notes (Signed)
ANTIBIOTIC CONSULT NOTE   Pharmacy Consult for vanc and zosyn Indication: aspiration PNA, r/o sepsis  No Known Allergies  Patient Measurements: Height: 5' (152.4 cm) Weight: 102 lb 15.3 oz (46.7 kg) IBW/kg (Calculated) : 45.5   Vital Signs: Temp: 97.6 F (36.4 C) (11/04 0805) Temp Source: Axillary (11/04 0805) BP: 131/58 mmHg (11/04 0900) Pulse Rate: 68 (11/04 0900) Intake/Output from previous day: 11/03 0701 - 11/04 0700 In: 4627.9 [I.V.:4177.9; IV Piggyback:400] Out: 1950 [Urine:1775; Blood:175] Intake/Output from this shift: Total I/O In: 319.4 [I.V.:169.4; IV Piggyback:150] Out: 125 [Urine:125]  Labs:  Recent Labs  07/27/14 0256 07/27/14 0542 07/27/14 0945 07/28/14 0430  WBC 9.1  --  16.8* 30.4*  HGB 6.0*  --  9.6* 9.5*  PLT 102*  --  141* 183  CREATININE 0.54 0.99  --  0.85   Estimated Creatinine Clearance: 47.4 mL/min (by C-G formula based on Cr of 0.85). No results for input(s): VANCOTROUGH, VANCOPEAK, VANCORANDOM, GENTTROUGH, GENTPEAK, GENTRANDOM, TOBRATROUGH, TOBRAPEAK, TOBRARND, AMIKACINPEAK, AMIKACINTROU, AMIKACIN in the last 72 hours.   Microbiology: Recent Results (from the past 720 hour(s))  MRSA PCR Screening     Status: None   Collection Time: 07/26/14  7:58 PM  Result Value Ref Range Status   MRSA by PCR NEGATIVE NEGATIVE Final    Comment:        The GeneXpert MRSA Assay (FDA approved for NASAL specimens only), is one component of a comprehensive MRSA colonization surveillance program. It is not intended to diagnose MRSA infection nor to guide or monitor treatment for MRSA infections.   Culture, blood (routine x 2)     Status: None (Preliminary result)   Collection Time: 07/26/14 11:49 PM  Result Value Ref Range Status   Specimen Description BLOOD LEFT ARM  Final   Special Requests BOTTLES DRAWN AEROBIC ONLY 5CC  Final   Culture  Setup Time   Final    07/27/2014 08:58 Performed at [facility]    Culture   Final   BLOOD CULTURE RECEIVED NO GROWTH TO DATE CULTURE WILL BE HELD FOR 5 DAYS BEFORE ISSUING A FINAL NEGATIVE REPORT Performed at [facility]    Report Status PENDING  Incomplete  Culture, respiratory (NON-Expectorated)     Status: None (Preliminary result)   Collection Time: 07/26/14 11:52 PM  Result Value Ref Range Status   Specimen Description TRACHEAL ASPIRATE  Final   Special Requests NONE  Final   Gram Stain PENDING  Incomplete   Culture   Final    Non-Pathogenic Oropharyngeal-type Flora Isolated. Performed at [facility]    Report Status PENDING  Incomplete  Culture, blood (routine x 2)     Status: None (Preliminary result)   Collection Time: 07/27/14 12:04 AM  Result Value Ref Range Status   Specimen Description BLOOD LEFT HAND  Final   Special Requests BOTTLES DRAWN AEROBIC ONLY 4CC  Final   Culture  Setup Time   Final    07/27/2014 08:58 Performed at [facility]    Culture   Final           BLOOD CULTURE RECEIVED NO GROWTH TO DATE CULTURE WILL BE HELD FOR 5 DAYS BEFORE ISSUING A FINAL NEGATIVE REPORT Performed at [facility]    Report Status PENDING  Incomplete     Assessment: [age] YOF brought in by EMS after co-workers called for a severe headache and mental status changes. She vomited in ambulance and aspiration suspected. She was originally taken to  [facility] but was transferred here after a SAH was discovered. Her antibiotics will be changed to vancomycin and zosyn r/o sepsis  Goal of Therapy:  eradication of infection  VT 15-20 mg/L  Plan:  Vancomycin 1 gm IV q24 hours Zosyn 3.375 gm IV q8 hours Follow renal function, c/s, and clinical progression  Thanks for allowing pharmacy to be a part of this patient's care.  Talbert CageLora Charmelle Soh, PharmD Clinical Pharmacist, [phone] 07/28/2014 9:41 AM

## 2014-07-28 NOTE — Plan of Care (Signed)
Problem: Phase I Progression Outcomes Goal: Bedrest HOB at 30 degrees Outcome: Completed/Met Date Met:  07/28/14

## 2014-07-28 NOTE — Plan of Care (Signed)
Problem: Consults Goal: Diagnosis - Craniotomy Outcome: Completed/Met Date Met:  07/28/14 Subdural hematoma

## 2014-07-28 NOTE — Anesthesia Postprocedure Evaluation (Signed)
Anesthesia Post Note  Patient: Stephanie SilvasVicky D Brindisi  Procedure(s) Performed: Procedure(s) (LRB): CRANIOTOMY INTRACRANIAL ANEURYSM FOR Pterional Aneurysm (Right)  Anesthesia type: General  Patient location: ICU  Post pain: Pain level controlled  Post assessment: Post-op Vital signs reviewed  Last Vitals:  Filed Vitals:   07/28/14 0200  BP: 122/58  Pulse: 51  Temp:   Resp: 16    Post vital signs: stable  Level of consciousness: Patient remains intubated per anesthesia plan  Complications: No apparent anesthesia complications

## 2014-07-28 NOTE — Progress Notes (Signed)
Pt seen and examined. No issues overnight.  EXAM: Temp:  [97.4 F (36.3 C)-98.3 F (36.8 C)] 98.3 F (36.8 C) (11/04 1558) Pulse Rate:  [46-90] 69 (11/04 1703) Resp:  [15-26] 19 (11/04 1703) BP: (98-152)/(41-59) 111/51 mmHg (11/04 1700) SpO2:  [98 %-100 %] 100 % (11/04 1700) Arterial Line BP: (71-172)/(40-58) 147/50 mmHg (11/04 1700) FiO2 (%):  [30 %] 30 % (11/04 1703) Weight:  [53.5 kg (117 lb 15.1 oz)] 53.5 kg (117 lb 15.1 oz) (11/04 1300) Intake/Output      11/03 0701 - 11/04 0700 11/04 0701 - 11/05 0700   I.V. (mL/kg) 4177.9 (89.5) 845.1 (15.8)   Other 50    IV Piggyback 400 400   Total Intake(mL/kg) 4627.9 (99.1) 1245.1 (23.3)   Urine (mL/kg/hr) 1775 (1.6) 520 (1)   Blood 175 (0.2)    Total Output 1950 520   Net +2677.9 +725.1         On low-dose fentanyl gtt Opens left eye to painful stimulus (right eye swollen) Moves BUE purposefully Moves BLE spontaneously Headwrap in place  LABS: Lab Results  Component Value Date   CREATININE 0.85 07/28/2014   BUN 9 07/28/2014   NA 144 07/28/2014   K 3.6* 07/28/2014   CL 114* 07/28/2014   CO2 19 07/28/2014   Lab Results  Component Value Date   WBC 30.4* 07/28/2014   HGB 9.5* 07/28/2014   HCT 29.8* 07/28/2014   MCV 86.6 07/28/2014   PLT 183 07/28/2014    IMAGING: CTH with small post-surgical orbitofrontal hypodensities. No large ACA territory infarcts. No HCP  IMPRESSION: - 63 y.o. female SAHd#2 s/p clipping of Acom aneurysm  PLAN: - Cont close observation - Spasm prophylaxis/monitoring - Extubate when able per PCCM

## 2014-07-29 ENCOUNTER — Encounter (HOSPITAL_COMMUNITY): Payer: Self-pay | Admitting: Neurosurgery

## 2014-07-29 LAB — CULTURE, RESPIRATORY W GRAM STAIN

## 2014-07-29 LAB — BASIC METABOLIC PANEL
ANION GAP: 12 (ref 5–15)
Anion gap: 12 (ref 5–15)
BUN: 11 mg/dL (ref 6–23)
BUN: 10 mg/dL (ref 6–23)
CALCIUM: 7.9 mg/dL — AB (ref 8.4–10.5)
CALCIUM: 8.2 mg/dL — AB (ref 8.4–10.5)
CO2: 19 meq/L (ref 19–32)
CO2: 19 meq/L (ref 19–32)
CREATININE: 0.96 mg/dL (ref 0.50–1.10)
Chloride: 117 mEq/L — ABNORMAL HIGH (ref 96–112)
Chloride: 120 mEq/L — ABNORMAL HIGH (ref 96–112)
Creatinine, Ser: 0.94 mg/dL (ref 0.50–1.10)
GFR calc Af Amer: 72 mL/min — ABNORMAL LOW (ref >90)
GFR calc non Af Amer: 61 mL/min — ABNORMAL LOW (ref >90)
GFR, EST AFRICAN AMERICAN: 70 mL/min — AB (ref >90)
GFR, EST NON AFRICAN AMERICAN: 62 mL/min — AB (ref >90)
GLUCOSE: 86 mg/dL (ref 70–99)
Glucose, Bld: 230 mg/dL — ABNORMAL HIGH (ref 70–99)
POTASSIUM: 3.1 meq/L — AB (ref 3.7–5.3)
Potassium: 3.3 mEq/L — ABNORMAL LOW (ref 3.7–5.3)
SODIUM: 151 meq/L — AB (ref 137–147)
Sodium: 148 mEq/L — ABNORMAL HIGH (ref 137–147)

## 2014-07-29 LAB — GLUCOSE, CAPILLARY
GLUCOSE-CAPILLARY: 146 mg/dL — AB (ref 70–99)
GLUCOSE-CAPILLARY: 367 mg/dL — AB (ref 70–99)
GLUCOSE-CAPILLARY: 93 mg/dL (ref 70–99)
Glucose-Capillary: 149 mg/dL — ABNORMAL HIGH (ref 70–99)
Glucose-Capillary: 167 mg/dL — ABNORMAL HIGH (ref 70–99)
Glucose-Capillary: 414 mg/dL — ABNORMAL HIGH (ref 70–99)
Glucose-Capillary: 90 mg/dL (ref 70–99)

## 2014-07-29 LAB — CULTURE, RESPIRATORY

## 2014-07-29 MED ORDER — SIMVASTATIN 40 MG PO TABS
40.0000 mg | ORAL_TABLET | Freq: Every day | ORAL | Status: DC
  Administered 2014-07-29 – 2014-08-17 (×20): 40 mg via ORAL
  Filled 2014-07-29 (×22): qty 1

## 2014-07-29 MED ORDER — INSULIN ASPART 100 UNIT/ML ~~LOC~~ SOLN
0.0000 [IU] | Freq: Three times a day (TID) | SUBCUTANEOUS | Status: DC
  Administered 2014-07-29: 20 [IU] via SUBCUTANEOUS
  Administered 2014-07-30: 7 [IU] via SUBCUTANEOUS
  Administered 2014-07-30: 3 [IU] via SUBCUTANEOUS
  Administered 2014-07-30 – 2014-07-31 (×2): 4 [IU] via SUBCUTANEOUS

## 2014-07-29 MED ORDER — INSULIN ASPART 100 UNIT/ML ~~LOC~~ SOLN: 0.0000 [IU] | Freq: Three times a day (TID) | SUBCUTANEOUS | Status: DC

## 2014-07-29 MED ORDER — PNEUMOCOCCAL VAC POLYVALENT 25 MCG/0.5ML IJ INJ
0.5000 mL | INJECTION | INTRAMUSCULAR | Status: AC
  Administered 2014-07-30: 0.5 mL via INTRAMUSCULAR
  Filled 2014-07-29: qty 0.5

## 2014-07-29 MED ORDER — DEXAMETHASONE SODIUM PHOSPHATE 4 MG/ML IJ SOLN
4.0000 mg | Freq: Two times a day (BID) | INTRAMUSCULAR | Status: AC
  Administered 2014-07-29 – 2014-07-30 (×4): 4 mg via INTRAVENOUS
  Filled 2014-07-29 (×4): qty 1

## 2014-07-29 NOTE — Progress Notes (Signed)
PULMONARY / CRITICAL CARE MEDICINE   Name: Stephanie Doyle MRN: [id number] DOB: Jan 09, 1951    ADMISSION DATE:  07/26/2014 CONSULTATION DATE:  07/26/2014  REFERRING MD :  Dr. Christella Noa  CHIEF COMPLAINT:  Headache  INITIAL PRESENTATION: 63 year old female with no known medical history presented to [facility] 11/2 with severe headache, found to have SAH. AMS in ED requiring intubation. She was transferred to [facility] for further eval. PCCM consulted for vent and ICU management.  STUDIES:  11/2 CT head > diffuse SAH, frontal and temporal bilaterally.  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, complex   SUBJECTIVE:  Following commands, interactive with WUA, on SBT right now, afebrile  VITAL SIGNS: Temp:  [97.6 F (36.4 C)-98.3 F (36.8 C)] 98.1 F (36.7 C) (11/05 0806) Pulse Rate:  [51-130] 83 (11/05 0900) Resp:  [17-30] 23 (11/05 0900) BP: (100-126)/(41-73) 113/47 mmHg (11/05 0900) SpO2:  [99 %-100 %] 100 % (11/05 0900) Arterial Line BP: (76-147)/(41-94) 76/59 mmHg (11/04 2300) FiO2 (%):  [30 %] 30 % (11/05 0836) Weight:  [53.5 kg (117 lb 15.1 oz)-54 kg (119 lb 0.8 oz)] 54 kg (119 lb 0.8 oz) (11/05 0500) HEMODYNAMICS: CVP:  [8 mmHg-15 mmHg] 13 mmHg VENTILATOR SETTINGS: Vent Mode:  [-] PSV FiO2 (%):  [30 %] 30 % Set Rate:  [15 bmp] 15 bmp Vt Set:  [370 mL] 370 mL PEEP:  [5 cmH20] 5 cmH20 Pressure Support:  [10 cmH20-12 cmH20] 10 cmH20 Plateau Pressure:  [14 cmH20-18 cmH20] 18 cmH20 INTAKE / OUTPUT:  Intake/Output Summary (Last 24 hours) at  07/29/14 0932 Last data filed at 07/29/14 0834  Gross per 24 hour  Intake 3211.07 ml  Output    800 ml  Net 2411.07 ml    PHYSICAL EXAMINATION: General:  On vent, localizes to touch HEENT: scalp dressing in place, bruising R eye, ett PULM: CTA B CV RRR no mgr Ab: BS+, soft, nontender Ext: warm, no edema Neuro: stirs to touch, follows commands  LABS:  CBC  Recent Labs Lab 07/27/14 0256 07/27/14 0945 07/28/14 0430  WBC 9.1 16.8* 30.4*  HGB 6.0* 9.6* 9.5*  HCT 18.9* 29.9* 29.8*  PLT 102* 141* 183   Coag's  Recent Labs Lab 07/26/14 2114 07/27/14 0925  APTT 42*  --   INR 2.52* 1.19   BMET  Recent Labs Lab 07/27/14 0542 07/28/14 0430 07/29/14 0500  NA 140 144 148*  K 3.9 3.6* 3.3*  CL 102 114* 117*  CO2 27 19 19   BUN 13 9 11   CREATININE 0.99 0.85 0.96  GLUCOSE 141* 165* 230*   Electrolytes  Recent Labs Lab 07/27/14 0542 07/28/14 0430 07/29/14 0500  CALCIUM 8.5 7.5* 7.9*   Sepsis Markers No results for input(s): LATICACIDVEN, PROCALCITON, O2SATVEN in the last 168 hours. ABG  Recent Labs Lab 07/26/14 2308  PHART 7.434  PCO2ART 43.8  PO2ART 156.0*   Liver Enzymes No results for input(s): AST, ALT, ALKPHOS, BILITOT, ALBUMIN in the last 168 hours. Cardiac Enzymes  Recent Labs Lab 07/28/14 0430 07/28/14 1126  TROPONINI <0.30 <0.30   Glucose  Recent Labs Lab 07/28/14 1205 07/28/14  1557 07/28/14 1954 07/29/14 0009 07/29/14 0303 07/29/14 0822  GLUCAP 184* 135* 143* 146* 149* 167*    Imaging Ct Head Wo Contrast  07/28/2014   CLINICAL DATA:  Follow-up craniotomy, aneurysm clipping.  EXAM: CT HEAD WITHOUT CONTRAST  TECHNIQUE: Contiguous axial images were obtained from the base of the skull through the vertex without intravenous contrast.  COMPARISON:  Prior CTA and cerebral angiogram from 07/27/2014  FINDINGS: Postoperative changes from interval right pterional craniotomy for clipping of anterior communicating artery aneurysm seen.  Pneumocephalus seen subjacent to the bone flap with a small amount of extra-axial blood products. Small amount of subdural hemorrhage seen layering along the falx is well. There is overlying scalp soft tissue swelling and emphysema. Skin staples present.  Previously identified diffuse subarachnoid hemorrhage is overall decreased relative to previous studies. There is increased intraventricular hemorrhage layering within the occipital horns of the lateral ventricles, compatible with redistribution. Overall ventricular size is stable at this time without evidence of hydrocephalus. No midline shift. Delete that  There is developing hypodensity within the medial aspect of the anterior inferior frontal lobes bilaterally, worrisome for evolving infarct (series 201, image 12).  No acute abnormality seen about the orbits. Mild right periorbital soft tissue swelling related to craniotomy seen.  Paranasal sinuses are largely clear.  No mastoid effusion.  IMPRESSION: 1. Postoperative changes from interval right pterional craniotomy with clipping of anterior communicating artery complex aneurysm. 2. Interval decrease in diffuse subarachnoid hemorrhage, with increased intraventricular hemorrhage within the occipital horns of the lateral ventricles, compatible with redistribution. No hydrocephalus or midline shift. 3. Developing hypodensity within the medial aspect of the anterior inferior frontal lobes bilaterally, most consistent with evolving infarcts.   Electronically Signed   By: Jeannine Boga M.D.   On: 07/28/2014 05:39   Dg Chest Port 1 View  07/28/2014   CLINICAL DATA:  Acute respiratory failure  EXAM: PORTABLE CHEST - 1 VIEW  COMPARISON:  July 27, 2014  FINDINGS: The lungs are well-expanded. The heart and pulmonary vascularity are normal. The mediastinum is normal in width. The endotracheal tube tip projects approximately 3.5 cm above the crotch of the carina. The right internal jugular catheter tip projects  over the midportion of the SVC. The esophagogastric tube tip projects below the GE junction.  IMPRESSION: There is no evidence of pneumonia, CHF, nor other acute abnormality in this intubated patient.   Electronically Signed   By: David  Martinique   On: 07/28/2014 08:28     ASSESSMENT / PLAN:  NEUROLOGIC A:   Subarachnoid hemorrhage ACA aneurysm > now s/p surgical clipping Frontal Lobe infarcts on 11/4 CT head P:   Management per Neurosurgery RASS goal: 0  Fentanyl gtt for analgesia/sedation > wean off Nimodipine Start statin 11/4 Daily WUA  PULMONARY OETT 11/2 >>> A: Acute hypoxemic respiratory failure 2nd to impaired airway protection COPD, not in exacerbation P:   Full vent support F/u CXR and ABG Extubation today PRN BD's  CARDIOVASCULAR A:  Shock > at this point related to sedation; echo OK P:  CVP monitoring Wean off levophed Monitor BP closely post extubation/d/c sedation tele Continue IVF  RENAL A:   Mild hypernatremia Hypokalemia P:   Monitor BMET and UOP Replace electrolytes as needed Free water with diet if eating today Repeat BMET later today  GASTROINTESTINAL A:   Nutrition GI prophylaxis P:   SLP eval post extubation SUP: IV pantoprazole   HEMATOLOGIC A:   Anemia without bleeding P:  Monitor for  bleeding Intermittent CBC  INFECTIOUS A:   Doubt aspiration pneumonia\ Broad antibiotics started on 11/4 for concern of sepsis, but no clear source P:   BCx2 11/2 >>> neg UC 11/2 >>> Sputum 11/2 >>> OPF Sputum 11/4 >>>  BCx2 11/4 >>  Abx:  Vanc 11/4 >> Zosyn 11/4 >>  If no clear sign of infection 11/6 would stop antibiotics  ENDOCRINE A:   No acute issues  P:   Follow CBG SSI   FAMILY  - Updates: Met sister and brother in law at bedside on 11/5 AM  - Inter-disciplinary family meet or Palliative Care meeting due by:  11/9  TODAY'S SUMMARY: Subarachnoid hemorrhage, now s/p surgical clipping. Extubate today, likely stop  broad spectrum antibiotics 11/6  CC time 40 minutes  Roselie Awkward, MD [place] P[facility] Pager: [phone] Cell: [phone] If no response, call [phone]

## 2014-07-29 NOTE — Progress Notes (Signed)
OT Cancellation Note  Patient Details Name: Stephanie Doyle MRN: [id number] DOB: 1950-12-30   Cancelled Treatment:    Reason Eval/Treat Not Completed: Patient not medically ready (BR)  [name], [name] 07/29/2014, 7:35 AM

## 2014-07-29 NOTE — Progress Notes (Signed)
Pt seen and examined. No issues overnight. Pt did not have cuff leak this am, remains intubated.  EXAM: Temp:  [97.6 F (36.4 C)-100.6 F (38.1 C)] 100.6 F (38.1 C) (11/05 1153) Pulse Rate:  [57-130] 103 (11/05 1130) Resp:  [17-32] 32 (11/05 1130) BP: (100-174)/(41-73) 174/70 mmHg (11/05 1130) SpO2:  [98 %-100 %] 99 % (11/05 1130) Arterial Line BP: (76-147)/(41-94) 76/59 mmHg (11/04 2300) FiO2 (%):  [30 %] 30 % (11/05 1130) Weight:  [54 kg (119 lb 0.8 oz)] 54 kg (119 lb 0.8 oz) (11/05 0500) Intake/Output      11/04 0701 - 11/05 0700 11/05 0701 - 11/06 0700   I.V. (mL/kg) 2079.6 (38.5) 92.8 (1.7)   Other     NG/GT 598 60   IV Piggyback 600 100   Total Intake(mL/kg) 3277.6 (60.7) 252.8 (4.7)   Urine (mL/kg/hr) 925 (0.7)    Blood     Total Output 925     Net +2352.6 +252.8         On low-dose fentany gtt: Opens right eye to voice Follows commands BUE, BLE more briskly with RLE compared to left Headwrap in place  LABS: Lab Results  Component Value Date   CREATININE 0.96 07/29/2014   BUN 11 07/29/2014   NA 148* 07/29/2014   K 3.3* 07/29/2014   CL 117* 07/29/2014   CO2 19 07/29/2014   Lab Results  Component Value Date   WBC 30.4* 07/28/2014   HGB 9.5* 07/28/2014   HCT 29.8* 07/28/2014   MCV 86.6 07/28/2014   PLT 183 07/28/2014    IMPRESSION: - 63 y.o. female SAH d#3 s/p Acom clipping - Neurologically improving - VDRF  PLAN: - Cont close neurologic observation. Lowest dose of fentanyl to keep pt from being agitated - Extubate when possible per PCCM. Steroids for airway edema. On Abx for leukocytosis, possible aspiration pneumonia - Cont MWF TCD, Nimotop - Keppra for post-surgical SZ prophylaxis

## 2014-07-29 NOTE — Plan of Care (Signed)
Problem: Phase I Progression Outcomes Goal: Respiratory status stable Outcome: Progressing Goal: Pain controlled with appropriate interventions Outcome: Progressing Goal: Bedrest HOB at 30 degrees Outcome: Completed/Met Date Met:  07/29/14

## 2014-07-29 NOTE — Progress Notes (Signed)
Inpatient Diabetes Program Recommendations  AACE/ADA: New Consensus Statement on Inpatient Glycemic Control (2013)  Target Ranges:  Prepandial:   less than 140 mg/dL      Peak postprandial:   less than 180 mg/dL (1-2 hours)      Critically ill patients:  140 - 180 mg/dL   Inpatient Diabetes Program Recommendations Correction (SSI): change to Q4 coverage while on tube feeds Insulin - Meal Coverage: consider adding Novolog tube feed coverage 4 units Q4 (should not be given if TF held or interrupted for any reason) Thank you  Piedad ClimesGina Avynn Klassen BSN, RN,CDE Inpatient Diabetes Coordinator [phone] (team pager)

## 2014-07-29 NOTE — Progress Notes (Signed)
PT Cancellation Note  Patient Details Name: Stephanie Doyle MRN: [id number] DOB: 06-Feb-1951   Cancelled Treatment:    Reason Eval/Treat Not Completed: Patient not medically ready   Fabio AsaWerner, Carlon Chaloux J 07/29/2014, 2:03 PM Charlotte Crumbevon Amare Kontos, PT DPT  [phone]

## 2014-07-29 NOTE — Progress Notes (Signed)
RT Note: Md had written an order for patient to be extubated. Prior to extubating RT checked a cuff leak which was negative. MD came to bedside and confirmed the negative leak test and will discontinue the extubation order. Patient currently continues to wean on psv but as of now the extubation is being held. Patient is in no acute distress and RT will continue to monitor.

## 2014-07-30 LAB — GLUCOSE, CAPILLARY
GLUCOSE-CAPILLARY: 220 mg/dL — AB (ref 70–99)
GLUCOSE-CAPILLARY: 138 mg/dL — AB (ref 70–99)
GLUCOSE-CAPILLARY: 139 mg/dL — AB (ref 70–99)
Glucose-Capillary: 146 mg/dL — ABNORMAL HIGH (ref 70–99)
Glucose-Capillary: 172 mg/dL — ABNORMAL HIGH (ref 70–99)
Glucose-Capillary: 192 mg/dL — ABNORMAL HIGH (ref 70–99)

## 2014-07-30 LAB — POCT I-STAT 7, (LYTES, BLD GAS, ICA,H+H)
Acid-base deficit: 3 mmol/L — ABNORMAL HIGH (ref 0.0–2.0)
Acid-base deficit: 1 mmol/L (ref 0.0–2.0)
Acid-base deficit: 4 mmol/L — ABNORMAL HIGH (ref 0.0–2.0)
BICARBONATE: 23 meq/L (ref 20.0–24.0)
Bicarbonate: 22.9 mEq/L (ref 20.0–24.0)
Bicarbonate: 20.5 mEq/L (ref 20.0–24.0)
CALCIUM ION: 1.04 mmol/L — AB (ref 1.13–1.30)
CALCIUM ION: 1.01 mmol/L — AB (ref 1.13–1.30)
Calcium, Ion: 1.1 mmol/L — ABNORMAL LOW (ref 1.13–1.30)
HCT: 28 % — ABNORMAL LOW (ref 36.0–46.0)
HEMATOCRIT: 26 % — AB (ref 36.0–46.0)
HEMATOCRIT: 24 % — AB (ref 36.0–46.0)
HEMOGLOBIN: 8.8 g/dL — AB (ref 12.0–15.0)
Hemoglobin: 9.5 g/dL — ABNORMAL LOW (ref 12.0–15.0)
Hemoglobin: 8.2 g/dL — ABNORMAL LOW (ref 12.0–15.0)
O2 SAT: 100 %
O2 SAT: 100 %
O2 Saturation: 100 %
PCO2 ART: 43 mmHg (ref 35.0–45.0)
PCO2 ART: 30.4 mmHg — AB (ref 35.0–45.0)
PH ART: 7.331 — AB (ref 7.350–7.450)
POTASSIUM: 3.8 meq/L (ref 3.7–5.3)
POTASSIUM: 3.7 meq/L (ref 3.7–5.3)
Patient temperature: 36.1
Potassium: 3.8 mEq/L (ref 3.7–5.3)
SODIUM: 137 meq/L (ref 137–147)
Sodium: 138 mEq/L (ref 137–147)
Sodium: 136 mEq/L — ABNORMAL LOW (ref 137–147)
TCO2: 24 mmol/L (ref 0–100)
TCO2: 24 mmol/L (ref 0–100)
TCO2: 21 mmol/L (ref 0–100)
pCO2 arterial: 30.5 mmHg — ABNORMAL LOW (ref 35.0–45.0)
pH, Arterial: 7.479 — ABNORMAL HIGH (ref 7.350–7.450)
pH, Arterial: 7.431 (ref 7.350–7.450)
pO2, Arterial: 536 mmHg — ABNORMAL HIGH (ref 80.0–100.0)
pO2, Arterial: 504 mmHg — ABNORMAL HIGH (ref 80.0–100.0)
pO2, Arterial: 541 mmHg — ABNORMAL HIGH (ref 80.0–100.0)

## 2014-07-30 LAB — CBC WITH DIFFERENTIAL/PLATELET
BASOS PCT: 0 % (ref 0–1)
Basophils Absolute: 0 10*3/uL (ref 0.0–0.1)
EOS ABS: 0 10*3/uL (ref 0.0–0.7)
EOS PCT: 0 % (ref 0–5)
HCT: 26.7 % — ABNORMAL LOW (ref 36.0–46.0)
HEMOGLOBIN: 8.5 g/dL — AB (ref 12.0–15.0)
LYMPHS PCT: 3 % — AB (ref 12–46)
Lymphs Abs: 0.7 10*3/uL (ref 0.7–4.0)
MCH: 28 pg (ref 26.0–34.0)
MCHC: 31.8 g/dL (ref 30.0–36.0)
MCV: 87.8 fL (ref 78.0–100.0)
MONO ABS: 1.4 10*3/uL — AB (ref 0.1–1.0)
Monocytes Relative: 6 % (ref 3–12)
Neutro Abs: 21.4 10*3/uL — ABNORMAL HIGH (ref 1.7–7.7)
Neutrophils Relative %: 91 % — ABNORMAL HIGH (ref 43–77)
PLATELETS: ADEQUATE 10*3/uL (ref 150–400)
RBC: 3.04 MIL/uL — AB (ref 3.87–5.11)
RDW: 15.2 % (ref 11.5–15.5)
WBC: 23.5 10*3/uL — ABNORMAL HIGH (ref 4.0–10.5)

## 2014-07-30 LAB — TYPE AND SCREEN
ABO/RH(D): A NEG
ANTIBODY SCREEN: NEGATIVE
UNIT DIVISION: 0
Unit division: 0
Unit division: 0
Unit division: 0

## 2014-07-30 LAB — BASIC METABOLIC PANEL
Anion gap: 13 (ref 5–15)
BUN: 14 mg/dL (ref 6–23)
CHLORIDE: 118 meq/L — AB (ref 96–112)
CO2: 18 mEq/L — ABNORMAL LOW (ref 19–32)
Calcium: 8.2 mg/dL — ABNORMAL LOW (ref 8.4–10.5)
Creatinine, Ser: 0.91 mg/dL (ref 0.50–1.10)
GFR calc non Af Amer: 65 mL/min — ABNORMAL LOW (ref >90)
GFR, EST AFRICAN AMERICAN: 75 mL/min — AB (ref >90)
Glucose, Bld: 214 mg/dL — ABNORMAL HIGH (ref 70–99)
POTASSIUM: 3.4 meq/L — AB (ref 3.7–5.3)
Sodium: 149 mEq/L — ABNORMAL HIGH (ref 137–147)

## 2014-07-30 LAB — CLOSTRIDIUM DIFFICILE BY PCR: CDIFFPCR: NEGATIVE

## 2014-07-30 LAB — CULTURE, RESPIRATORY W GRAM STAIN: Special Requests: NORMAL

## 2014-07-30 MED ORDER — FAMOTIDINE 40 MG/5ML PO SUSR
20.0000 mg | Freq: Two times a day (BID) | ORAL | Status: DC
  Administered 2014-07-30 – 2014-08-26 (×54): 20 mg
  Filled 2014-07-30 (×56): qty 2.5

## 2014-07-30 NOTE — Progress Notes (Signed)
UR completed. Await extubation and then therapy evals to determine next best level of care after discharge.   Carlyle LipaMichelle Ryka Beighley, RN BSN MHA CCM Trauma/Neuro ICU Case Manager [phone]

## 2014-07-30 NOTE — Progress Notes (Signed)
PULMONARY / CRITICAL CARE MEDICINE   Name: Stephanie Doyle MRN: [id number] DOB: Mar 17, 1951    ADMISSION DATE:  07/26/2014 CONSULTATION DATE:  07/26/2014  REFERRING MD :  Dr. Christella Noa  CHIEF COMPLAINT:  Headache  INITIAL PRESENTATION: 63 year old female with no known medical history presented to [facility] 11/2 with severe headache, found to have SAH. AMS in ED requiring intubation. She was transferred to [facility] for further eval. PCCM consulted for vent and ICU management.  STUDIES:  11/2 CT head > diffuse SAH, frontal and temporal bilaterally.  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; no aneurysm in intracranial circulation; stable SAH 11/4 CT head >> decrease in size of SAH, developing hypodensity within medial aspiect of inferior frontal lobes consistent with evolving infarcts 11/4 Echo> LVEF 60-65%, wall motion normal, Grade 1 diastolic dysfunction  SIGNIFICANT EVENTS: 11/2[facility], Intubated for airway protection, transferred to [facility] NICU 11/4- Right pterional craniotomy for clipping of anterior communicating artery aneurysm, complex 11/5 - no cuff leak >> started steroids  SUBJECTIVE:   Remains on pressors to prevent vasospasm.  No cuff leak.  Tolerating pressure support.  VITAL SIGNS: Temp:  [97.9 F (36.6 C)-100.6 F (38.1 C)] 97.9 F (36.6 C) (11/06 0756) Pulse Rate:  [52-103] 74 (11/06 0900) Resp:  [14-32] 21 (11/06 0900) BP: (103-174)/(44-70) 128/50 mmHg (11/06 0900) SpO2:  [97 %-100 %] 100 % (11/06 0900) FiO2 (%):  [30 %] 30 % (11/06 0900) Weight:  [124 lb 9 oz (56.5 kg)] 124 lb 9 oz (56.5 kg) (11/06 0429) HEMODYNAMICS: CVP:  [0 mmHg-20 mmHg] 12 mmHg VENTILATOR SETTINGS: Vent Mode:  [-] CPAP;PSV FiO2 (%):  [30 %] 30 % Set Rate:  [15 bmp] 15 bmp Vt Set:  [370 mL] 370 mL PEEP:  [5 cmH20] 5 cmH20 Pressure Support:  [10 cmH20] 10 cmH20 Plateau Pressure:  [16 cmH20-25 cmH20] 17 cmH20 INTAKE / OUTPUT:  Intake/Output Summary (Last 24 hours) at 07/30/14 1033 Last data filed at  07/30/14 0928  Gross per 24 hour  Intake 2879.25 ml  Output   1420 ml  Net 1459.25 ml    PHYSICAL EXAMINATION: General: no distress HEENT: scalp dressing in place, bruising R eye, ett PULM: CTA B CV RRR no mgr Ab: soft, mild distention, gurgling bowel sounds Ext: warm, no edema Neuro: RASS -1  LABS:  CBC  Recent Labs Lab 07/27/14 0945 07/28/14 0430 07/30/14 0505  WBC 16.8* 30.4* 23.5*  HGB 9.6* 9.5* 8.5*  HCT 29.9* 29.8* 26.7*  PLT 141* 183 PLATELET CLUMPS NOTED ON SMEAR, COUNT APPEARS ADEQUATE   Coag's  Recent Labs Lab 07/26/14 2114 07/27/14 0925  APTT 42*  --   INR 2.52* 1.19   BMET  Recent Labs Lab 07/29/14 0500 07/29/14 1600 07/30/14 0505  NA 148* 151* 149*  K 3.3* 3.1* 3.4*  CL 117* 120* 118*  CO2 19 19 18*  BUN _0 CREATININE 0.96 0.94 0.91  GLUCOSE 230* 86 214*   Electrolytes  Recent Labs Lab 07/29/14 0500 07/29/14 1600 07/30/14 0505  CALCIUM 7.9* 8.2* 8.2*   Sepsis Markers No results for input(s): LATICACIDVEN, PROCALCITON, O2SATVEN in the last 168 hours.   ABG  Recent Labs Lab 07/26/14 2308  PHART 7.434  PCO2ART 43.8  PO2ART 156.0*   Cardiac Enzymes  Recent Labs Lab 07/28/14 0430 07/28/14 1126  TROPONINI <0.30 <0.30   Glucose  Recent Labs Lab 07/29/14 1258 07/29/14 1608 07/29/14 1953 07/30/14 0007 07/30/14 0401 07/30/14 0753  GLUCAP 367* 90 93  172* 192* 220*    Imaging No results found.   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 COPD, not in exacerbation. Concern for upper airway edema 11/05. P:   Full vent support F/u CXR and ABG Decadron started 11/05 >>  continue, and re-assess cuff leak >> might need eval by ENT Pressure support wean as tolerated PRN BD's  CARDIOVASCULAR Rt IJ CVL 11/03 >> A:  [place] therapy in setting of [place] P:  CVP monitoring Levophed for MAP goal > 65 Continue IVF  RENAL A:   Mild hypernatremia Hypokalemia P:   F/u BMET  GASTROINTESTINAL A:   Nutrition P:   Tube feeds while on vent SUP: pepcid  HEMATOLOGIC A:   Anemia of critical illness. Leukocytosis. P:  F/u CBC SCD for DVT prevention  INFECTIOUS A:   Sepsis 11/04 ?source P:   Day 5 of Abx, currently on zosyn/vancomycin >> narrow Abx once cx results final  Blood 11/02 >> Blood 11/03 >> Blood 11/04 >> C diff 11/06 >>   ENDOCRINE A:   No acute issues P:   SSI while on tube feeds   FAMILY  - Updates: Met sister and brother in law at bedside on 11/5 AM  - Inter-disciplinary family meet or Palliative Care meeting due by:  11/9  TODAY'S SUMMARY:  Subarachnoid hemorrhage, now s/p surgical clipping.  Continue Abx while awaiting cx results.  Concern for upper airway edema preventing extubation trial >> if no improvement over weekend, then might need ENT evaluation.  CC time 35 minutes.  Chesley Mires, MD [facility] Pulmonary/Critical Care 07/30/2014, 10:41 AM Pager:  [phone] After 3pm call: [phone]

## 2014-07-30 NOTE — Progress Notes (Signed)
Inpatient Diabetes Program Recommendations  AACE/ADA: New Consensus Statement on Inpatient Glycemic Control (2013)  Target Ranges:  Prepandial:   less than 140 mg/dL      Peak postprandial:   less than 180 mg/dL (1-2 hours)      Critically ill patients:  140 - 180 mg/dL   Results for Stephanie Doyle, Stephanie Doyle (MRN [id number]) as of 07/30/2014 07:09  Ref. Range 07/29/2014 12:58 07/29/2014 16:08 07/29/2014 19:53 07/30/2014 00:07 07/30/2014 04:01  Glucose-Capillary Latest Range: 70-99 mg/dL 409367 (H) 90 93 811172 (H) 914192 (H)   Reason for Assessment: labile blood sugars  Current orders for Inpatient glycemic control: Novolog correction resistant 0-20 units q 4h    May want to consider adding Novolog tube feed coverage 4 units Q4 (should not be given if TF held or interrupted for any reason).    Susette RacerJulie Vander Kueker, RN, BA, MHA, CDE Diabetes Coordinator Inpatient Diabetes Program  [phone] (Team Pager) [phone] Patrcia Dolly([place] Office) 07/30/2014 7:11 AM

## 2014-07-30 NOTE — Progress Notes (Signed)
PT Cancellation Note  Patient Details Name: Stephanie Doyle MRN: [id number] DOB: 1950/11/13   Cancelled Treatment:    Reason Eval/Treat Not Completed: Patient not medically ready.  Pt either restless with HR 130's and 140, or sedate from meds.  Will f/u tomorrow.     Cashawn Yanko, Alison MurrayMegan F 07/30/2014, 12:17 PM

## 2014-07-30 NOTE — Progress Notes (Signed)
SLP Cancellation Note  Patient Details Name: Stephanie Doyle MRN: [id number] DOB: 09/22/1951   Cancelled treatment:       Reason Eval/Treat Not Completed: Patient not medically ready   Mallory Schaad, [name] 07/30/2014, 9:19 AM

## 2014-07-30 NOTE — Progress Notes (Signed)
Transcranial Doppler  Date POD PCO2 HCT BP  MCA ACA PCA OPHT SIPH VERT Basilar  07/28/14 MS   29.8 108/46 Right  Left   105  105   -69  -91   66  32   16  10   *  73   -27  -32   *  *    11-6- 15 SB     Right  Left   91  126   -57  -117   52  37/44   32  17   63  88   --  -26     *         Right  Left                                             Right  Left                                             Right  Left                                            Right  Left                                            Right  Left                                        MCA = Middle Cerebral Artery      OPHT = Opthalmic Artery     BASILAR = Basilar Artery   ACA = Anterior Cerebral Artery     SIPH = Carotid Siphon PCA = Posterior Cerebral Artery   VERT = Verterbral Artery                   Normal MCA = 62+\-12 ACA = 50+\-12 PCA = 42+\-23   *Unable to insonate  07/30/2014 3:16 PM Michelle Simonetti, RVT, RDCS, RDMS

## 2014-07-31 ENCOUNTER — Inpatient Hospital Stay (HOSPITAL_COMMUNITY): Payer: 59

## 2014-07-31 DIAGNOSIS — J9601 Acute respiratory failure with hypoxia: Secondary | ICD-10-CM

## 2014-07-31 DIAGNOSIS — I609 Nontraumatic subarachnoid hemorrhage, unspecified: Secondary | ICD-10-CM | POA: Diagnosis present

## 2014-07-31 LAB — BASIC METABOLIC PANEL
Anion gap: 12 (ref 5–15)
BUN: 16 mg/dL (ref 6–23)
CHLORIDE: 121 meq/L — AB (ref 96–112)
CO2: 19 mEq/L (ref 19–32)
CREATININE: 0.89 mg/dL (ref 0.50–1.10)
Calcium: 8.2 mg/dL — ABNORMAL LOW (ref 8.4–10.5)
GFR calc non Af Amer: 67 mL/min — ABNORMAL LOW (ref >90)
GFR, EST AFRICAN AMERICAN: 77 mL/min — AB (ref >90)
GLUCOSE: 186 mg/dL — AB (ref 70–99)
Potassium: 3.2 mEq/L — ABNORMAL LOW (ref 3.7–5.3)
Sodium: 152 mEq/L — ABNORMAL HIGH (ref 137–147)

## 2014-07-31 LAB — CBC
HCT: 27.4 % — ABNORMAL LOW (ref 36.0–46.0)
HEMOGLOBIN: 8.6 g/dL — AB (ref 12.0–15.0)
MCH: 27.7 pg (ref 26.0–34.0)
MCHC: 31.4 g/dL (ref 30.0–36.0)
MCV: 88.1 fL (ref 78.0–100.0)
Platelets: 199 10*3/uL (ref 150–400)
RBC: 3.11 MIL/uL — ABNORMAL LOW (ref 3.87–5.11)
RDW: 15.3 % (ref 11.5–15.5)
WBC: 15.1 10*3/uL — ABNORMAL HIGH (ref 4.0–10.5)

## 2014-07-31 LAB — GLUCOSE, CAPILLARY
GLUCOSE-CAPILLARY: 95 mg/dL (ref 70–99)
Glucose-Capillary: 100 mg/dL — ABNORMAL HIGH (ref 70–99)
Glucose-Capillary: 157 mg/dL — ABNORMAL HIGH (ref 70–99)
Glucose-Capillary: 162 mg/dL — ABNORMAL HIGH (ref 70–99)
Glucose-Capillary: 172 mg/dL — ABNORMAL HIGH (ref 70–99)
Glucose-Capillary: 181 mg/dL — ABNORMAL HIGH (ref 70–99)

## 2014-07-31 MED ORDER — POTASSIUM CHLORIDE CRYS ER 20 MEQ PO TBCR
40.0000 meq | EXTENDED_RELEASE_TABLET | Freq: Once | ORAL | Status: AC
  Administered 2014-07-31: 40 meq via ORAL
  Filled 2014-07-31: qty 2

## 2014-07-31 MED ORDER — LOPERAMIDE HCL 1 MG/5ML PO LIQD
4.0000 mg | Freq: Once | ORAL | Status: AC
  Administered 2014-07-31: 4 mg via ORAL
  Filled 2014-07-31: qty 20

## 2014-07-31 MED ORDER — FUROSEMIDE 10 MG/ML IJ SOLN
20.0000 mg | Freq: Once | INTRAMUSCULAR | Status: AC
  Administered 2014-07-31: 20 mg via INTRAVENOUS
  Filled 2014-07-31: qty 2

## 2014-07-31 MED ORDER — SODIUM CHLORIDE 0.45 % IV SOLN
INTRAVENOUS | Status: DC
Start: 2014-07-31 — End: 2014-08-08
  Administered 2014-07-31 – 2014-08-04 (×13): via INTRAVENOUS
  Administered 2014-08-05: 120 mL/h via INTRAVENOUS
  Administered 2014-08-05 – 2014-08-07 (×7): via INTRAVENOUS

## 2014-07-31 MED ORDER — LOPERAMIDE HCL 1 MG/5ML PO LIQD
2.0000 mg | ORAL | Status: DC | PRN
  Administered 2014-07-31 – 2014-08-23 (×4): 2 mg via ORAL
  Filled 2014-07-31 (×6): qty 10

## 2014-07-31 MED ORDER — INSULIN ASPART 100 UNIT/ML ~~LOC~~ SOLN
0.0000 [IU] | SUBCUTANEOUS | Status: DC
  Administered 2014-07-31: 3 [IU] via SUBCUTANEOUS
  Administered 2014-07-31 – 2014-08-01 (×2): 4 [IU] via SUBCUTANEOUS
  Administered 2014-08-01: 3 [IU] via SUBCUTANEOUS
  Administered 2014-08-01: 4 [IU] via SUBCUTANEOUS
  Administered 2014-08-01 (×2): 3 [IU] via SUBCUTANEOUS
  Administered 2014-08-02: 7 [IU] via SUBCUTANEOUS
  Administered 2014-08-02 (×5): 4 [IU] via SUBCUTANEOUS
  Administered 2014-08-02: 7 [IU] via SUBCUTANEOUS
  Administered 2014-08-03: 4 [IU] via SUBCUTANEOUS
  Administered 2014-08-03 (×3): 3 [IU] via SUBCUTANEOUS
  Administered 2014-08-04 (×3): 4 [IU] via SUBCUTANEOUS
  Administered 2014-08-04 (×2): 3 [IU] via SUBCUTANEOUS
  Administered 2014-08-04 (×2): 4 [IU] via SUBCUTANEOUS
  Administered 2014-08-05 – 2014-08-06 (×4): 3 [IU] via SUBCUTANEOUS
  Administered 2014-08-06: 4 [IU] via SUBCUTANEOUS
  Administered 2014-08-06 (×2): 3 [IU] via SUBCUTANEOUS
  Administered 2014-08-07 (×2): 4 [IU] via SUBCUTANEOUS
  Administered 2014-08-07: 7 [IU] via SUBCUTANEOUS
  Administered 2014-08-07 – 2014-08-08 (×5): 4 [IU] via SUBCUTANEOUS
  Administered 2014-08-08 – 2014-08-09 (×4): 3 [IU] via SUBCUTANEOUS
  Administered 2014-08-09: 4 [IU] via SUBCUTANEOUS
  Administered 2014-08-10 – 2014-08-11 (×5): 3 [IU] via SUBCUTANEOUS
  Administered 2014-08-11: 4 [IU] via SUBCUTANEOUS
  Administered 2014-08-11 – 2014-08-12 (×5): 3 [IU] via SUBCUTANEOUS
  Administered 2014-08-12: 4 [IU] via SUBCUTANEOUS
  Administered 2014-08-13 – 2014-08-14 (×8): 3 [IU] via SUBCUTANEOUS
  Administered 2014-08-14: 4 [IU] via SUBCUTANEOUS
  Administered 2014-08-15: 3 [IU] via SUBCUTANEOUS
  Administered 2014-08-15 (×2): 4 [IU] via SUBCUTANEOUS
  Administered 2014-08-16: 3 [IU] via SUBCUTANEOUS
  Administered 2014-08-16: 4 [IU] via SUBCUTANEOUS
  Administered 2014-08-16: 3 [IU] via SUBCUTANEOUS
  Administered 2014-08-16: 4 [IU] via SUBCUTANEOUS
  Administered 2014-08-17 – 2014-08-18 (×7): 3 [IU] via SUBCUTANEOUS
  Administered 2014-08-18: 4 [IU] via SUBCUTANEOUS
  Administered 2014-08-18: 3 [IU] via SUBCUTANEOUS
  Administered 2014-08-18 – 2014-08-19 (×2): 4 [IU] via SUBCUTANEOUS
  Administered 2014-08-19: 3 [IU] via SUBCUTANEOUS
  Administered 2014-08-19: 4 [IU] via SUBCUTANEOUS
  Administered 2014-08-19: 7 [IU] via SUBCUTANEOUS
  Administered 2014-08-19: 4 [IU] via SUBCUTANEOUS
  Administered 2014-08-20: 11 [IU] via SUBCUTANEOUS
  Administered 2014-08-20: 7 [IU] via SUBCUTANEOUS
  Administered 2014-08-20: 11 [IU] via SUBCUTANEOUS
  Administered 2014-08-20 (×3): 4 [IU] via SUBCUTANEOUS
  Administered 2014-08-21 (×5): 7 [IU] via SUBCUTANEOUS
  Administered 2014-08-21: 4 [IU] via SUBCUTANEOUS
  Administered 2014-08-22: 11 [IU] via SUBCUTANEOUS
  Administered 2014-08-22 (×4): 4 [IU] via SUBCUTANEOUS
  Administered 2014-08-22: 7 [IU] via SUBCUTANEOUS
  Administered 2014-08-23: 3 [IU] via SUBCUTANEOUS
  Administered 2014-08-23: 7 [IU] via SUBCUTANEOUS
  Administered 2014-08-23 – 2014-08-24 (×3): 3 [IU] via SUBCUTANEOUS
  Administered 2014-08-24 (×2): 4 [IU] via SUBCUTANEOUS
  Administered 2014-08-24: 7 [IU] via SUBCUTANEOUS
  Administered 2014-08-24 – 2014-08-25 (×2): 4 [IU] via SUBCUTANEOUS
  Administered 2014-08-25: 3 [IU] via SUBCUTANEOUS
  Administered 2014-08-25: 7 [IU] via SUBCUTANEOUS
  Administered 2014-08-25 – 2014-08-26 (×2): 3 [IU] via SUBCUTANEOUS
  Administered 2014-08-26: 4 [IU] via SUBCUTANEOUS

## 2014-07-31 NOTE — Progress Notes (Signed)
No new problems or events overnight. Patient remains on IV sedation secondary to agitation. Patient will attempt to self extubate when off sedation. Requires restraints.  Afebrile. Heart rate acceptable. Bloodpressure somewhat labile but overall running about a mean arterial pressure of around 90.urine output good. Patient in positive fluid balance.remains somewhat hypernatremic however.  Sedated. Patient will open eyes and appears awake and aware with vigorous stimulation. He remains intubated. We'll follow commands slowly with both upper and lower extremities.  Transcranial Dopplers yesterday with elevated velocities left greater than right.  Clinical situation worrisome for mild spasm. We'll push fluids a little harder today. Continue to observe.

## 2014-07-31 NOTE — Progress Notes (Signed)
ANTIBIOTIC CONSULT NOTE - FOLLOW UP  Pharmacy Consult for vancomycin and zosyn Indication: PNA, r/o sepsis  No Known Allergies  Patient Measurements: Height: 5' (152.4 cm) Weight: 126 lb 1.7 oz (57.2 kg) IBW/kg (Calculated) : 45.5   Vital Signs: Temp: 99.7 F (37.6 C) (11/07 0800) Temp Source: Oral (11/07 0800) BP: 126/57 mmHg (11/07 0800) Pulse Rate: 74 (11/07 0800) Intake/Output from previous day: 11/06 0701 - 11/07 0700 In: 3518.8 [I.V.:2048.8; NG/GT:920; IV Piggyback:550] Out: 1865 [Urine:1240; Stool:625] Intake/Output from this shift: Total I/O In: 224 [I.V.:84; NG/GT:40; IV Piggyback:100] Out: 90 [Urine:90]  Labs:  Recent Labs  07/29/14 1600 07/30/14 0505 07/31/14 0500  WBC  --  23.5* 15.1*  HGB  --  8.5* 8.6*  PLT  --  PLATELET CLUMPS NOTED ON SMEAR, COUNT APPEARS ADEQUATE 199  CREATININE 0.94 0.91 0.89   Estimated Creatinine Clearance: 49.9 mL/min (by C-G formula based on Cr of 0.89). No results for input(s): VANCOTROUGH, VANCOPEAK, VANCORANDOM, GENTTROUGH, GENTPEAK, GENTRANDOM, TOBRATROUGH, TOBRAPEAK, TOBRARND, AMIKACINPEAK, AMIKACINTROU, AMIKACIN in the last 72 hours.   Microbiology: Recent Results (from the past 720 hour(s))  MRSA PCR Screening     Status: None   Collection Time: 07/26/14  7:58 PM  Result Value Ref Range Status   MRSA by PCR NEGATIVE NEGATIVE Final    Comment:        The GeneXpert MRSA Assay (FDA approved for NASAL specimens only), is one component of a comprehensive MRSA colonization surveillance program. It is not intended to diagnose MRSA infection nor to guide or monitor treatment for MRSA infections.   Culture, Urine     Status: None   Collection Time: 07/26/14 11:12 PM  Result Value Ref Range Status   Specimen Description URINE, CATHETERIZED  Final   Special Requests NONE  Final   Culture  Setup Time   Final    07/27/2014 10:07 Performed at [facility]    Colony Count NO GROWTH Performed at [facility]   Final   Culture NO GROWTH Performed at [facility]   Final   Report Status 07/28/2014 FINAL  Final  Culture, blood (routine x 2)     Status: None (Preliminary result)   Collection Time: 07/26/14 11:49 PM  Result Value Ref Range Status   Specimen Description BLOOD LEFT ARM  Final   Special Requests BOTTLES DRAWN AEROBIC ONLY 5CC  Final   Culture  Setup Time   Final    07/27/2014 08:58 Performed at [facility]    Culture   Final           BLOOD CULTURE RECEIVED NO GROWTH TO DATE CULTURE WILL BE HELD FOR 5 DAYS BEFORE ISSUING A FINAL NEGATIVE REPORT Performed at [facility]    Report Status PENDING  Incomplete  Culture, respiratory (NON-Expectorated)     Status: None   Collection Time: 07/26/14 11:52 PM  Result Value Ref Range Status   Specimen Description TRACHEAL ASPIRATE  Final   Special Requests NONE  Final   Gram Stain   Final    RARE WBC PRESENT, PREDOMINANTLY PMN RARE SQUAMOUS EPITHELIAL CELLS PRESENT RARE GRAM POSITIVE COCCI IN PAIRS Performed at [facility]    Culture   Final    Non-Pathogenic Oropharyngeal-type Flora Isolated. Performed at [facility]    Report Status 07/29/2014 FINAL  Final  Culture, blood (routine x 2)     Status: None (Preliminary result)   Collection Time: 07/27/14 12:04 AM  Result  Value Ref Range Status   Specimen Description BLOOD LEFT HAND  Final   Special Requests BOTTLES DRAWN AEROBIC ONLY 4CC  Final   Culture  Setup Time   Final    07/27/2014 08:58 [facility]    Culture   Final           BLOOD CULTURE RECEIVED NO GROWTH TO DATE CULTURE WILL BE HELD FOR 5 DAYS BEFORE ISSUING A FINAL NEGATIVE REPORT [facility]formed at [facility]    Report Status PENDING  Incomplete  Culture, respiratory (NON-Expectorated)     Status: None   Collection Time: 07/28/14  8:49 AM  Result Value Ref Range Status   Specimen Description TRACHEAL ASPIRATE  Final   Special  Requests Normal  Final   Gram Stain   Final    FEW WBC PRESENT,BOTH PMN AND MONONUCLEAR NO SQUAMOUS EPITHELIAL CELLS SEEN NO ORGANISMS SEEN [facility]formed at [facility]    Culture   Final    Non-Pathogenic Oropharyngeal-type Flora Isolated. Performed at [facility]    Report Status 07/30/2014 FINAL  Final  Culture, blood (routine x 2)     Status: None (Preliminary result)   Collection Time: 07/28/14  9:43 AM  Result Value Ref Range Status   Specimen Description BLOOD LEFT HAND  Final   Special Requests BOTTLES DRAWN AEROBIC ONLY 10CC  Final   Culture  Setup Time   Final    07/28/2014 15:42 [facility]formed at [facility]    Culture   Final           BLOOD CULTURE RECEIVED NO GROWTH TO DATE CULTURE WILL BE HELD FOR 5 DAYS BEFORE ISSUING A FINAL NEGATIVE REPORT Performed at [facility]    Report Status PENDING  Incomplete  Culture, blood (routine x 2)     Status: None (Preliminary result)   Collection Time: 07/28/14 10:00 AM  Result Value Ref Range Status   Specimen Description BLOOD LEFT HAND  Final   Special Requests BOTTLES DRAWN AEROBIC ONLY 8CC  Final   Culture  Setup Time   Final    07/28/2014 15:42 [facility]formed at [facility]    Culture   Final           BLOOD CULTURE RECEIVED NO GROWTH TO DATE CULTURE WILL BE HELD FOR 5 DAYS BEFORE ISSUING A FINAL NEGATIVE REPORT [facility]formed at [facility]    Report Status PENDING  Incomplete  Clostridium Difficile by PCR     Status: None   Collection Time: 07/30/14  8:06 AM  Result Value Ref Range Status   C difficile by pcr NEGATIVE NEGATIVE Final    Anti-infectives    Start     Dose/Rate Route Frequency Ordered Stop   07/29/14 0930  vancomycin (VANCOCIN) IVPB 1000 mg/200 mL premix     1,000 mg200 mL/hr over 60 Minutes Intravenous Every 24 hours 07/28/14 0941     07/28/14 0930  piperacillin-tazobactam (ZOSYN) IVPB 3.375 g     3.375 g12.5 mL/hr over 240 Minutes Intravenous Every 8 hours  07/28/14 0916     07/28/14 0930  vancomycin (VANCOCIN) IVPB 1000 mg/200 mL premix     1,000 mg200 mL/hr over 60 Minutes Intravenous  Once 07/28/14 0916 07/28/14 1100   07/27/14 1704  bacitracin 50,000 Units in sodium chloride irrigation 0.9 % 500 mL irrigation  Status:  Discontinued       As needed 07/27/14 1704 07/27/14 2111   07/26/14 2300  ampicillin-sulbactam (UNASYN) 1.5 g in sodium chloride 0.9 % 50 mL IVPB  Status:  Discontinued     1.5 g100 mL/hr over 30 Minutes Intravenous Every 6 hours 07/26/14 2213 07/28/14 0422      Assessment: Patient is a 63 y.o F on vancomycin and zosyn for broad empiric coverage.  Tmax 99.7, wbc 15.1, scr stable.  11/07 CXR - "No evidence of active pulmonary disease"  Unasyn 11/2>>11/4 Vanc 11/4>> Zosyn 11/4>>  MRSA pcr (-) 11/2 TA>> normal flora 11/06 TA>> neg FINAL 11/06 cdiff> neg FINAL 11/04 bcx x2>> ngtd 11/02 ucx >> neg FINAL 11/02 bcx>> ngtd 11/03 bcx x2>  ngtd   Goal of Therapy:  Vancomycin trough level 15-20 mcg/ml  Plan:  1. Continue zosyn 3.375gm IV Q8H (4 hr inf) 2. Continue vanc 1gm IV Q24H 3. F/u renal fxn, C&S, clinical status 4. Will plan on obtaining level if to treat for >7 days   Stephanie Doyle P 07/31/2014,9:19 AM

## 2014-07-31 NOTE — Progress Notes (Signed)
PT Cancellation Note  Patient Details Name: Stephanie Doyle MRN: [id number] DOB: 11-15-1950   Cancelled Treatment:    Reason Eval/Treat Not Completed: Patient not medically ready;Patient's level of consciousness;Medical issues which prohibited therapy. Pt intubated. Not appropriate for PT at this time. Will hold till Monday if appropriate per RN.    Donnamarie PoagWest, Holger Sokolowski Morgan FarmN, [place]  [phone] 07/31/2014, 11:09 AM

## 2014-07-31 NOTE — Progress Notes (Signed)
PULMONARY / CRITICAL CARE MEDICINE   Name: Stephanie Doyle MRN: [id number] DOB: 1950-11-08    ADMISSION DATE:  07/26/2014 CONSULTATION DATE:  07/26/2014  REFERRING MD :  Dr. Christella Noa  CHIEF COMPLAINT:  Headache  INITIAL PRESENTATION: 63 year old female with no known medical history presented to [facility] 11/2 with severe headache, found to have SAH. AMS in ED requiring intubation. She was transferred to [facility] for further eval. PCCM consulted for vent and ICU management.  STUDIES:  11/2 CT head > diffuse SAH, frontal and temporal bilaterally.  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, complex 11/5 - no cuff leak >> started steroids 11/7 not weaning  SUBJECTIVE:   [place] PS 12 currently  VITAL SIGNS: Temp:  [97.8 F (36.6 C)-99.7 F (37.6 C)] 99.7 F (37.6 C) (11/07 0800) Pulse Rate:  [25-110] 74 (11/07 0800) Resp:  [16-31] 20 (11/07 0800) BP: (113-167)/(47-78) 126/57 mmHg (11/07 0800) SpO2:  [87 %-100 %] 100 % (11/07 0800) FiO2 (%):  [30 %] 30 % (11/07 0800) Weight:  [126 lb 1.7 oz (57.2 kg)] 126 lb 1.7 oz (57.2 kg) (11/07 0451) HEMODYNAMICS: CVP:  [9 mmHg-78 mmHg] 15 mmHg VENTILATOR SETTINGS: Vent Mode:  [-] PSV;CPAP FiO2 (%):  [30 %] 30 % Set Rate:  [15 bmp] 15 bmp Vt Set:  [370 mL] 370 mL PEEP:  [5 cmH20] 5 cmH20 Pressure Support:  [10 cmH20-12 cmH20] 12 cmH20 Plateau Pressure:  [16 cmH20-17 cmH20] 16 cmH20 INTAKE / OUTPUT:  Intake/Output Summary (Last 24 hours) at 07/31/14 0950 Last data filed at 07/31/14 0830  Gross per 24 hour   Intake 3121.71 ml  Output   1830 ml  Net 1291.71 ml    PHYSICAL EXAMINATION: General: no distress HEENT: scalp dressing in place, bruising R eye, ett PULM: CTA B CV RRR no mgr Ab: soft, mild distention, gurgling bowel sounds Ext: warm, no edema Neuro: RASS -1, follows commands  LABS:  CBC  Recent Labs Lab 07/28/14 0430 07/30/14 0505 07/31/14 0500  WBC 30.4* 23.5* 15.1*  HGB 9.5* 8.5* 8.6*  HCT 29.8* 26.7* 27.4*  PLT 183 PLATELET CLUMPS NOTED ON SMEAR, COUNT APPEARS ADEQUATE 199   Coag's  Recent Labs Lab 07/26/14 2114 07/27/14 0925  APTT 42*  --   INR 2.52* 1.19   BMET  Recent Labs Lab 07/29/14 1600 07/30/14 0505 07/31/14 0500  NA 151* 149* 152*  K 3.1* 3.4* 3.2*  CL 120* 118* 121*  CO2 19 18* 19  BUN _0 CREATININE 0.94 0.91 0.89  GLUCOSE 86 214* 186*   Electrolytes  Recent Labs Lab 07/29/14 1600 07/30/14 0505 07/31/14 0500  CALCIUM 8.2* 8.2* 8.2*   Sepsis Markers No results for input(s): LATICACIDVEN, PROCALCITON, O2SATVEN in the last 168 hours.   ABG  Recent Labs Lab 07/27/14 1605 07/27/14 1649 07/27/14 1836  PHART 7.331* 7.479* 7.431  PCO2ART 43.0 30.4* 30.5*  PO2ART 536.0* 504.0* 541.0*   Cardiac Enzymes  Recent Labs Lab 07/28/14 0430 07/28/14 1126  TROPONINI <0.30 <0.30   Glucose  Recent Labs Lab 07/30/14 1137 07/30/14 1605 07/30/14 1952 07/31/14 0006 07/31/14 0424 07/31/14  0810  GLUCAP 138* 146* 139* 157* 181* 172*    Imaging No results found.   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 COPD, not in exacerbation. Concern for upper airway edema 11/05. Not weanable  11/7 P:   Full vent support F/u CXR and ABG Decadron started 11/05 >> continue, and re-assess cuff leak >> might need eval by ENT Pressure support wean as tolerated PRN BD's  CARDIOVASCULAR Rt IJ CVL 11/03 >> A:  [place] therapy in setting of [place] P:  CVP monitoring Levophed for MAP goal > 65 Continue IVF  RENAL  Recent Labs Lab 07/29/14 1600 07/30/14 0505 07/31/14 0500  NA 151* 149* 152*    Recent Labs Lab 07/29/14 1600 07/30/14 0505 07/31/14 0500  K 3.1* 3.4* 3.2*     A:   Mild hypernatremia Hypokalemia P:   F/u BMET  GASTROINTESTINAL A:   Nutrition P:   Tube feeds while on vent SUP: pepcid  HEMATOLOGIC A:   Anemia of critical illness. Leukocytosis. P:  F/u CBC SCD for DVT prevention  INFECTIOUS A:   Sepsis 11/04 ?source P:   Day 6 of Abx, currently on zosyn/vancomycin >> narrow Abx once cx results final  Blood 11/02 >> Blood 11/03 >> Blood 11/04 >> C diff 11/06 >>  Sputum 11/2>>neg UC 11-2>>neg  ENDOCRINE A:   No acute issues P:   SSI while on tube feeds   FAMILY  - Updates: Met sister and brother in law at bedside on 11/5 AM  - Inter-disciplinary family meet or Palliative Care meeting due by:  11/9  TODAY'S SUMMARY:  Subarachnoid hemorrhage, now s/p surgical clipping.  Continue Abx while awaiting cx results.  Concern for upper airway edema preventing extubation trial >> if no improvement over weekend, then might need ENT evaluation.   Richardson Landry Yehuda Printup ACNP Maryanna Shape PCCM Pager [phone] till 3 pm If no answer page [phone] 07/31/2014, 9:51 AM

## 2014-07-31 NOTE — Progress Notes (Signed)
Placed back on full support.  Pt becoming aggitated and RR and rr up.

## 2014-08-01 ENCOUNTER — Encounter (HOSPITAL_COMMUNITY): Payer: Self-pay | Admitting: Radiology

## 2014-08-01 ENCOUNTER — Inpatient Hospital Stay (HOSPITAL_COMMUNITY): Payer: 59

## 2014-08-01 LAB — CBC
HCT: 27 % — ABNORMAL LOW (ref 36.0–46.0)
Hemoglobin: 8.3 g/dL — ABNORMAL LOW (ref 12.0–15.0)
MCH: 26.9 pg (ref 26.0–34.0)
MCHC: 30.7 g/dL (ref 30.0–36.0)
MCV: 87.7 fL (ref 78.0–100.0)
Platelets: 202 10*3/uL (ref 150–400)
RBC: 3.08 MIL/uL — AB (ref 3.87–5.11)
RDW: 15 % (ref 11.5–15.5)
WBC: 10.1 10*3/uL (ref 4.0–10.5)

## 2014-08-01 LAB — BASIC METABOLIC PANEL
Anion gap: 11 (ref 5–15)
BUN: 14 mg/dL (ref 6–23)
CHLORIDE: 116 meq/L — AB (ref 96–112)
CO2: 22 mEq/L (ref 19–32)
Calcium: 8 mg/dL — ABNORMAL LOW (ref 8.4–10.5)
Creatinine, Ser: 0.94 mg/dL (ref 0.50–1.10)
GFR calc non Af Amer: 62 mL/min — ABNORMAL LOW (ref >90)
GFR, EST AFRICAN AMERICAN: 72 mL/min — AB (ref >90)
GLUCOSE: 159 mg/dL — AB (ref 70–99)
POTASSIUM: 3 meq/L — AB (ref 3.7–5.3)
Sodium: 149 mEq/L — ABNORMAL HIGH (ref 137–147)

## 2014-08-01 LAB — GLUCOSE, CAPILLARY
GLUCOSE-CAPILLARY: 162 mg/dL — AB (ref 70–99)
Glucose-Capillary: 133 mg/dL — ABNORMAL HIGH (ref 70–99)
Glucose-Capillary: 144 mg/dL — ABNORMAL HIGH (ref 70–99)
Glucose-Capillary: 135 mg/dL — ABNORMAL HIGH (ref 70–99)
Glucose-Capillary: 130 mg/dL — ABNORMAL HIGH (ref 70–99)
Glucose-Capillary: 115 mg/dL — ABNORMAL HIGH (ref 70–99)
Glucose-Capillary: 190 mg/dL — ABNORMAL HIGH (ref 70–99)

## 2014-08-01 LAB — PHOSPHORUS: Phosphorus: 2 mg/dL — ABNORMAL LOW (ref 2.3–4.6)

## 2014-08-01 LAB — MAGNESIUM: MAGNESIUM: 1.6 mg/dL (ref 1.5–2.5)

## 2014-08-01 LAB — PRO B NATRIURETIC PEPTIDE: PRO B NATRI PEPTIDE: 683.6 pg/mL — AB (ref 0–125)

## 2014-08-01 MED ORDER — DEXTROSE 5 % IV SOLN
10.0000 mmol | Freq: Once | INTRAVENOUS | Status: AC
  Administered 2014-08-01: 10 mmol via INTRAVENOUS
  Filled 2014-08-01: qty 3.33

## 2014-08-01 MED ORDER — IPRATROPIUM-ALBUTEROL 0.5-2.5 (3) MG/3ML IN SOLN
3.0000 mL | RESPIRATORY_TRACT | Status: DC
  Administered 2014-08-01 – 2014-08-21 (×118): 3 mL via RESPIRATORY_TRACT
  Filled 2014-08-01 (×117): qty 3

## 2014-08-01 MED ORDER — POTASSIUM CHLORIDE 20 MEQ/15ML (10%) PO SOLN
40.0000 meq | Freq: Once | ORAL | Status: AC
  Administered 2014-08-01: 40 meq via ORAL
  Filled 2014-08-01: qty 30

## 2014-08-01 MED ORDER — ALBUMIN HUMAN 5 % IV SOLN
12.5000 g | Freq: Four times a day (QID) | INTRAVENOUS | Status: DC
  Administered 2014-08-01 – 2014-08-09 (×32): 12.5 g via INTRAVENOUS
  Filled 2014-08-01 (×37): qty 250
  Filled 2014-08-01: qty 500
  Filled 2014-08-01 (×23): qty 250

## 2014-08-01 MED ORDER — MAGNESIUM SULFATE 2 GM/50ML IV SOLN
2.0000 g | Freq: Once | INTRAVENOUS | Status: AC
  Administered 2014-08-01: 2 g via INTRAVENOUS
  Filled 2014-08-01 (×2): qty 50

## 2014-08-01 MED ORDER — FREE WATER
100.0000 mL | Freq: Three times a day (TID) | Status: DC
  Administered 2014-08-01: 100 mL

## 2014-08-01 NOTE — Progress Notes (Signed)
Wasted 120 ml of fentanyl 2500mcg per 250 ml with Everette RankJennifer Carmichael.

## 2014-08-01 NOTE — Plan of Care (Signed)
Problem: Phase I Progression Outcomes Goal: Pain controlled with appropriate interventions Outcome: Progressing     

## 2014-08-01 NOTE — Progress Notes (Signed)
eLink Physician-Brief Progress Note Patient Name: Stephanie SilvasVicky D Doyle DOB: 1951/03/15 MRN: [id number]   Date of Service  08/01/2014  HPI/Events of Note  K low  eICU Interventions  K replaced     Intervention Category Major Interventions: Other:  [name] 08/01/2014, 7:00 AM

## 2014-08-01 NOTE — Progress Notes (Signed)
Transported pt on Ventilator to CT scan and back.  When in room placed on original settings.

## 2014-08-01 NOTE — Progress Notes (Signed)
No new problems or events overnight. Patient remains sedated on fentanyl secondary to agitation area and  Afebrile. Blood pressure acceptable currently 158/63. Urine output good. Fluid balance yesterday negative however CVP is 12. Chest x-ray does not show evidence of significant pulmonary edema. Sodium down to 149. Hematocrit 27 which is acceptable. Patient will awaken and appears aware to noxious stimuli area and otherwise very somnolent. Purposeful with both upper and lower extremities somewhat less brisk on the left side however. I cannot get her to reliably follow commands this morning. Pupils 3 mm and brisk bilaterally. Corneals, gag, cough reflexes all present.  Overall without major change however I am somewhat concerned by her relative hemiparesis which did not appear as pronounced yesterday. I would like to get a follow-up head CT scan to rule out any new issues or problems. Continue efforts at fluid expansion. Add albumin at 30 mL per hour.

## 2014-08-01 NOTE — Progress Notes (Signed)
Placed back on full support.  Concerned about fluctuating O2sats.  Going from 99-88 .  Placed finger probe and sats 99.  Monitor reading 88

## 2014-08-01 NOTE — Progress Notes (Signed)
PULMONARY / CRITICAL CARE MEDICINE   Name: Stephanie Doyle MRN: [id number] DOB: 09/16/1951    ADMISSION DATE:  07/26/2014 CONSULTATION DATE:  07/26/2014  REFERRING MD :  Dr. Christella Noa  CHIEF COMPLAINT:  Headache  INITIAL PRESENTATION: 63 year old female with no known medical history presented to [facility] 11/2 with severe headache, found to have SAH. AMS in ED requiring intubation. She was transferred to [facility] for further eval. PCCM consulted for vent and ICU management.  STUDIES:  11/2 CT head > diffuse SAH, frontal and temporal bilaterally.  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, complex 11/5 - no cuff leak >> started steroids 11/7 not weaning , lowest ps was 12  SUBJECTIVE:   [place] PS 12 currently  VITAL SIGNS: Temp:  [98 F (36.7 C)-99.7 F (37.6 C)] 98.9 F (37.2 C) (11/08 0600) Pulse Rate:  [60-127] 63 (11/08 0729) Resp:  [15-23] 20 (11/08 0729) BP: (113-169)/(46-82) 113/53 mmHg (11/08 0729) SpO2:  [98 %-100 %] 98 % (11/08 0729) FiO2 (%):  [30 %] 30 % (11/08 0729) Weight:  [126 lb 1.7 oz (57.2 kg)] 126 lb 1.7 oz (57.2 kg) (11/08 0440) HEMODYNAMICS: CVP:  [9 mmHg-17 mmHg] 12 mmHg VENTILATOR SETTINGS: Vent Mode:  [-] PRVC FiO2 (%):  [30 %] 30 % Set Rate:  [15 bmp] 15 bmp Vt Set:  [370 mL] 370 mL PEEP:  [5 cmH20] 5 cmH20 Pressure Support:  [12 cmH20] 12 cmH20 Plateau Pressure:  [16 cmH20-18 cmH20] 16 cmH20 INTAKE / OUTPUT:  Intake/Output Summary (Last 24 hours) at 08/01/14 0736 Last data filed at 08/01/14 0700  Gross per 24 hour   Intake 4287.5 ml  Output   4410 ml  Net -122.5 ml    PHYSICAL EXAMINATION: General: no distress, sedated on fentanyl HEENT: Rt frontal suture line intact bruising R eye, ett PULM: CTA B CV RRR no mgr Ab: soft, mild distention, gurgling bowel sounds Ext: warm, ++ edema Neuro: RASS -1,No  follows commands  LABS:  CBC  Recent Labs Lab 07/30/14 0505 07/31/14 0500 08/01/14 0447  WBC 23.5* 15.1* 10.1  HGB 8.5* 8.6* 8.3*  HCT 26.7* 27.4* 27.0*  PLT PLATELET CLUMPS NOTED ON SMEAR, COUNT APPEARS ADEQUATE 199 202   Coag's  Recent Labs Lab 07/26/14 2114 07/27/14 0925  APTT 42*  --   INR 2.52* 1.19   BMET  Recent Labs Lab 07/30/14 0505 07/31/14 0500 08/01/14 0447  NA 149* 152* 149*  K 3.4* 3.2* 3.0*  CL 118* 121* 116*  CO2 18* 19 22  BUN _0 CREATININE 0.91 0.89 0.94  GLUCOSE 214* 186* 159*   Electrolytes  Recent Labs Lab 07/30/14 0505 07/31/14 0500 08/01/14 0447  CALCIUM 8.2* 8.2* 8.0*  MG  --   --  1.6  PHOS  --   --  2.0*   Sepsis Markers No results for input(s): LATICACIDVEN, PROCALCITON, O2SATVEN in the last 168 hours.   ABG  Recent Labs Lab 07/27/14 1605 07/27/14 1649 07/27/14 1836  PHART 7.331* 7.479* 7.431  PCO2ART 43.0 30.4* 30.5*  PO2ART 536.0* 504.0* 541.0*   Cardiac Enzymes  Recent Labs  Lab 07/28/14 0430 07/28/14 1126 08/01/14 0447  TROPONINI <0.30 <0.30  --   PROBNP  --   --  683.6*   Glucose  Recent Labs Lab 07/31/14 1604 07/31/14 1606 07/31/14 1610 07/31/14 2001 08/01/14 0036 08/01/14 0413  GLUCAP 100* 30* 95 144* 133* 135*    Imaging Dg Chest Port 1 View  07/31/2014   CLINICAL DATA:  Atelectasis.  Patient intubated.  EXAM: PORTABLE CHEST - 1 VIEW  COMPARISON:  07/28/2014  FINDINGS: Endotracheal tube tip measures 3.6 cm above the carinal. Enteric tube tip is off the field of view but below the left hemidiaphragm. Right central venous catheter projects over the mid SVC region. No pneumothorax. Normal heart  size and pulmonary vascularity. Pulmonary hyperinflation probably due emphysema. No focal airspace disease or consolidation in the lungs. No blunting of costophrenic angles.  IMPRESSION: Appliances appear in satisfactory location. Pulmonary hyperinflation. No evidence of active pulmonary disease.   Electronically Signed   By: Lucienne Capers M.D.   On: 07/31/2014 01:04   Dg Abd Portable 1v  07/31/2014   CLINICAL DATA:  NG tube placement  EXAM: PORTABLE ABDOMEN - 1 VIEW  COMPARISON:  07/27/2014  FINDINGS: Enteric tube tip is localized over the right upper quadrant consistent with location in the distal stomach. Diffuse gas-filled nondistended colon suggesting ileus.  IMPRESSION: Enteric tube tip localizes to the right upper quadrant consistent with location in the distal stomach.   Electronically Signed   By: Lucienne Capers M.D.   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 COPD, not in exacerbation. Concern for upper airway edema 11/05. Not weanable 11/7 P:   Full vent support F/u CXR and ABG Decadron started 11/05 >> continue, and re-assess cuff leak >> might need eval by ENT Pressure support wean as tolerated PRN BD's Diuresis may help with airway edema, Per MD  CARDIOVASCULAR Rt IJ CVL 11/03 >> A:  [place] therapy in setting of [place] P:  CVP monitoring 11/8 cvp 12 Levophed for MAP goal > 65 Continue IVF as per NS .45 saline at 150 cc/hr, consider dc  RENAL  Recent  Labs Lab 07/30/14 0505 07/31/14 0500 08/01/14 0447  NA 149* 152* 149*    Recent Labs Lab 07/30/14 0505 07/31/14 0500 08/01/14 0447  K 3.4* 3.2* 3.0*     A:   Mild hypernatremia Hypokalemia hypophosphoremia  P:   F/u BMET Replete K+ On 1/2 ns per ns at 150 cc /hr and lasix per PCCM? Consider dc .45 saline, use free h2o and diurese per MD.  Free H20 11/8 ,100 cc q 8 h Check phos in am,   GASTROINTESTINAL A:   Nutrition P:   Tube feeds while on vent SUP: pepcid  HEMATOLOGIC A:   Anemia of critical illness. Leukocytosis. P:  F/u CBC SCD for DVT prevention  INFECTIOUS A:   Sepsis 11/04 ?source P:   Day 7 of Abx, currently on zosyn/vancomycin >> narrow Abx  once cx results final  Blood 11/02 >> Blood 11/03 >> Blood 11/04 >> C diff 11/06 >> neg Sputum 11/2>>neg UC 11-2>>neg  ENDOCRINE A:   No acute issues P:   SSI while on tube feeds   FAMILY  - Updates: Met sister and brother in law at bedside on 11/5 AM  - Inter-disciplinary family meet or Palliative Care meeting due by:  11/9  TODAY'S SUMMARY:  Subarachnoid hemorrhage, now s/p surgical clipping.  Continue Abx while awaiting cx results.  Concern for upper airway edema preventing extubation trial >> if no improvement over weekend, then might need ENT evaluation. Did not wean beyond 12 ps on 11/7.   Richardson Landry Lamount Bankson ACNP Maryanna Shape PCCM Pager [phone] till 3 pm If no answer page [phone] 08/01/2014, 7:36 AM

## 2014-08-02 ENCOUNTER — Inpatient Hospital Stay (HOSPITAL_COMMUNITY): Payer: 59

## 2014-08-02 ENCOUNTER — Encounter (HOSPITAL_COMMUNITY): Payer: Self-pay | Admitting: *Deleted

## 2014-08-02 LAB — BASIC METABOLIC PANEL WITH GFR
Anion gap: 11 (ref 5–15)
BUN: 11 mg/dL (ref 6–23)
CO2: 23 meq/L (ref 19–32)
Calcium: 8.4 mg/dL (ref 8.4–10.5)
Chloride: 110 meq/L (ref 96–112)
Creatinine, Ser: 0.72 mg/dL (ref 0.50–1.10)
GFR calc Af Amer: >90 mL/min
GFR calc non Af Amer: 88 mL/min — ABNORMAL LOW
Glucose, Bld: 195 mg/dL — ABNORMAL HIGH (ref 70–99)
Potassium: 3.4 meq/L — ABNORMAL LOW (ref 3.7–5.3)
Sodium: 144 meq/L (ref 137–147)

## 2014-08-02 LAB — GLUCOSE, CAPILLARY
GLUCOSE-CAPILLARY: 167 mg/dL — AB (ref 70–99)
GLUCOSE-CAPILLARY: 184 mg/dL — AB (ref 70–99)
Glucose-Capillary: 30 mg/dL — CL (ref 70–99)
Glucose-Capillary: 168 mg/dL — ABNORMAL HIGH (ref 70–99)
Glucose-Capillary: 180 mg/dL — ABNORMAL HIGH (ref 70–99)
Glucose-Capillary: 195 mg/dL — ABNORMAL HIGH (ref 70–99)
Glucose-Capillary: 215 mg/dL — ABNORMAL HIGH (ref 70–99)

## 2014-08-02 LAB — CULTURE, BLOOD (ROUTINE X 2)
Culture: NO GROWTH
Culture: NO GROWTH

## 2014-08-02 LAB — MAGNESIUM: Magnesium: 2.1 mg/dL (ref 1.5–2.5)

## 2014-08-02 LAB — CBC
HEMATOCRIT: 22.9 % — AB (ref 36.0–46.0)
HEMOGLOBIN: 7.2 g/dL — AB (ref 12.0–15.0)
MCH: 27.4 pg (ref 26.0–34.0)
MCHC: 31.4 g/dL (ref 30.0–36.0)
MCV: 87.1 fL (ref 78.0–100.0)
Platelets: 189 10*3/uL (ref 150–400)
RBC: 2.63 MIL/uL — ABNORMAL LOW (ref 3.87–5.11)
RDW: 14.7 % (ref 11.5–15.5)
WBC: 9.6 10*3/uL (ref 4.0–10.5)

## 2014-08-02 LAB — PHOSPHORUS: Phosphorus: 2.9 mg/dL (ref 2.3–4.6)

## 2014-08-02 MED ORDER — POTASSIUM CHLORIDE CRYS ER 20 MEQ PO TBCR: 40.0000 meq | EXTENDED_RELEASE_TABLET | Freq: Three times a day (TID) | ORAL | Status: DC

## 2014-08-02 MED ORDER — SODIUM CHLORIDE 0.9 % IJ SOLN
10.0000 mL | INTRAMUSCULAR | Status: DC | PRN
  Administered 2014-08-14: 10 mL
  Filled 2014-08-02: qty 40

## 2014-08-02 MED ORDER — SODIUM CHLORIDE 0.9 % IV SOLN
0.0000 ug/min | INTRAVENOUS | Status: DC
  Administered 2014-08-02: 2 ug/min via INTRAVENOUS
  Administered 2014-08-02: 10 ug/min via INTRAVENOUS
  Administered 2014-08-03: 7 ug/min via INTRAVENOUS
  Administered 2014-08-04: 4 ug/min via INTRAVENOUS
  Administered 2014-08-04: 11 ug/min via INTRAVENOUS
  Administered 2014-08-04: 8 ug/min via INTRAVENOUS
  Administered 2014-08-05: 16 ug/min via INTRAVENOUS
  Administered 2014-08-05: 15 ug/min via INTRAVENOUS
  Administered 2014-08-05: 8 ug/min via INTRAVENOUS
  Filled 2014-08-02 (×10): qty 4

## 2014-08-02 MED ORDER — SODIUM CHLORIDE 0.9 % IJ SOLN
10.0000 mL | Freq: Two times a day (BID) | INTRAMUSCULAR | Status: DC
  Administered 2014-08-02 – 2014-08-05 (×4): 10 mL
  Administered 2014-08-05: 30 mL
  Administered 2014-08-06 – 2014-08-15 (×18): 10 mL
  Administered 2014-08-16: 20 mL
  Administered 2014-08-16: 10 mL
  Administered 2014-08-17: 30 mL
  Administered 2014-08-17 – 2014-08-18 (×2): 20 mL
  Administered 2014-08-19 (×2): 10 mL
  Administered 2014-08-20: 20 mL
  Administered 2014-08-20 – 2014-08-21 (×2): 10 mL
  Administered 2014-08-22: 20 mL
  Administered 2014-08-23 – 2014-08-26 (×7): 10 mL

## 2014-08-02 MED ORDER — IOHEXOL 350 MG/ML SOLN
100.0000 mL | Freq: Once | INTRAVENOUS | Status: AC | PRN
  Administered 2014-08-02: 100 mL via INTRAVENOUS

## 2014-08-02 MED ORDER — VITAL HIGH PROTEIN PO LIQD
1000.0000 mL | ORAL | Status: DC
  Administered 2014-08-03 – 2014-08-07 (×4): 1000 mL
  Administered 2014-08-08: 08:00:00
  Filled 2014-08-02 (×8): qty 1000

## 2014-08-02 MED ORDER — POTASSIUM CHLORIDE 20 MEQ/15ML (10%) PO SOLN
40.0000 meq | Freq: Three times a day (TID) | ORAL | Status: AC
  Administered 2014-08-02: 40 meq via ORAL
  Filled 2014-08-02 (×2): qty 30

## 2014-08-02 MED ORDER — POTASSIUM CHLORIDE 20 MEQ/15ML (10%) PO SOLN
40.0000 meq | Freq: Three times a day (TID) | ORAL | Status: DC
  Filled 2014-08-02 (×3): qty 30

## 2014-08-02 NOTE — Progress Notes (Signed)
PT Cancellation Note  Patient Details Name: Stephanie Doyle MRN: [id number] DOB: 1950-10-22   Cancelled Treatment:    Reason Eval/Treat Not Completed: Patient not medically readyPlease advance activity order when pt becomes appropriate for mobility.     Vernida Mcnicholas, Alison MurrayMegan F 08/02/2014, 8:35 AM

## 2014-08-02 NOTE — Progress Notes (Signed)
Pt seen and examined. No issues overnight. Pt remains on vent, on low-dose fentanyl gtt. Became tachypneic, appeared agitated when weaned off.  EXAM: Temp:  [98.2 F (36.8 C)-99.1 F (37.3 C)] 98.9 F (37.2 C) (11/09 1159) Pulse Rate:  [79-106] 94 (11/09 1600) Resp:  [16-27] 21 (11/09 1600) BP: (124-188)/(46-96) 160/56 mmHg (11/09 1600) SpO2:  [100 %] 100 % (11/09 1600) FiO2 (%):  [40 %] 40 % (11/09 1600) Weight:  [60 kg (132 lb 4.4 oz)] 60 kg (132 lb 4.4 oz) (11/09 0500) Intake/Output      11/08 0701 - 11/09 0700 11/09 0701 - 11/10 0700   I.V. (mL/kg) 3097.8 (51.6) 1131.3 (18.9)   NG/GT 1020 515   IV Piggyback 1815.8 600   Total Intake(mL/kg) 5933.6 (98.9) 2246.3 (37.4)   Urine (mL/kg/hr) 1560 (1.1) 550 (1)   Stool 625 (0.4)    Total Output 2185 550   Net +3748.6 +1696.3        Stool Occurrence 1 x     Opens eyes to painful stim Moves BUE purposefully, Occassional spontaneous movements RLE. No significant movement of LLE seen Wound c/d/i  LABS: Lab Results  Component Value Date   CREATININE 0.72 08/02/2014   BUN 11 08/02/2014   NA 144 08/02/2014   K 3.4* 08/02/2014   CL 110 08/02/2014   CO2 23 08/02/2014   Lab Results  Component Value Date   WBC 9.6 08/02/2014   HGB 7.2* 08/02/2014   HCT 22.9* 08/02/2014   MCV 87.1 08/02/2014   PLT 189 08/02/2014    IMAGING: CT head reviewed with right-sided Huebner stroke noted. Ventricle size appears essentially unchanged in comparison to postop scan.  IMPRESSION: - 63 y.o. female SAHd# 7 s/p Acom aneurysm clipping - Appears to have worsened neurologic exam over past 24 hrs concerning for spasm.   PLAN: - Cont hyperdynamic therapy with IVF and colloid for intravascular fluid expansion - Will d/w PCCM, may add pressor to increased SBP to 180-26500mmHg range - CTA/CTP to eval for spasm/perfusion deficit - Pt has been intubated for 1 week, now with increased fluids and airway edema, may consider early trach.

## 2014-08-02 NOTE — Plan of Care (Signed)
Problem: Phase I Progression Outcomes Goal: Hemodynamically stable Outcome: Completed/Met Date Met:  08/02/14     

## 2014-08-02 NOTE — Progress Notes (Signed)
OT Cancellation Note  Patient Details Name: Stephanie Doyle MRN: [id number] DOB: 07-13-1951   Cancelled Treatment:    Reason Eval/Treat Not Completed: Patient not medically ready. Pt remains intubated and on bedrest. Please reorder when appropriate.  Thank you.  Evern BioMayberry, Padraic Marinos Lynn 08/02/2014, 8:22 AM

## 2014-08-02 NOTE — Progress Notes (Signed)
Peripherally Inserted Central Catheter/Midline Placement  The IV Nurse has discussed with the patient and/or persons authorized to consent for the patient, the purpose of this procedure and the potential benefits and risks involved with this procedure.  The benefits include less needle sticks, lab draws from the catheter and patient may be discharged home with the catheter.  Risks include, but not limited to, infection, bleeding, blood clot (thrombus formation), and puncture of an artery; nerve damage and irregular heat beat.  Alternatives to this procedure were also discussed. Bleeding post procedure at exit site. Manual pressure with 10 minutes with hemostasis achieved. Pressure gauze dressing applied.  PICC/Midline Placement Documentation  PICC Triple Lumen 08/02/14 PICC Right Basilic 38 cm 1 cm (Active)  Indication for Insertion or Continuance of Line Administration of hyperosmolar/irritating solutions (i.e. TPN, Vancomycin, etc.) 08/02/2014 10:00 PM  Exposed Catheter (cm) 1 cm 08/02/2014 10:00 PM  Site Assessment Clean;Dry;Intact 08/02/2014 10:00 PM  Lumen #1 Status Flushed;Blood return noted 08/02/2014 10:00 PM  Lumen #2 Status Flushed;Blood return noted 08/02/2014 10:00 PM  Lumen #3 Status Flushed;Blood return noted 08/02/2014 10:00 PM  Dressing Type Gauze;Occlusive 08/02/2014 10:00 PM  Dressing Status Clean;Dry;Intact;Antimicrobial disc in place 08/02/2014 10:00 PM  Dressing Change Due 08/04/14 08/02/2014 10:00 PM       Christeen Douglashomas, Danicka Hourihan L 08/02/2014, 10:28 PM

## 2014-08-02 NOTE — Progress Notes (Signed)
Transcranial Doppler  Date POD PCO2 HCT BP  MCA ACA PCA OPHT SIPH VERT Basilar  07/28/14 MS   29.8 108/46 Right  Left   105  105   -69  -91   66  32   16  10   *  73   -27  -32   *  *    11-6- 15 SB     Right  Left   91  126   -57  -117   52  37/44   32  17   63  88   --  -26     *    08-02-14 VS     Right  Left   53  76   -41  -65   49  59   30  23   27  24    -79  -56   -85            Right  Left                                             Right  Left                                            Right  Left                                            Right  Left                                        MCA = Middle Cerebral Artery      OPHT = Opthalmic Artery     BASILAR = Basilar Artery   ACA = Anterior Cerebral Artery     SIPH = Carotid Siphon PCA = Posterior Cerebral Artery   VERT = Verterbral Artery                   Normal MCA = 62+\-12 ACA = 50+\-12 PCA = 42+\-23   *Unable to insonate  08/02/2014 7:45 PM Graybar ElectricVirginia Dakarri Doyle, RVS

## 2014-08-02 NOTE — Progress Notes (Signed)
PULMONARY / CRITICAL CARE MEDICINE   Name: Stephanie Doyle MRN: [id number] DOB: 1951-01-31    ADMISSION DATE:  07/26/2014 CONSULTATION DATE:  07/26/2014  REFERRING MD :  Dr. Franky Machoabbell  CHIEF COMPLAINT:  Headache  INITIAL PRESENTATION: 63 year old female with no known medical history [facility] 11[facility]2 with severe headache, found to have SAH. AMS in ED requiring intubation. [facility] PCCM consulted for vent and ICU management.  STUDIES:  11/2 CT head > diffuse SAH, frontal and temporal bilaterally.  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, complex 11/5 - no cuff leak >> started steroids 11/7 not weaning , lowest ps was 12  SUBJECTIVE:   [place] PS 12 currently  VITAL SIGNS: Temp:  [98.2 F (36.8 C)-99.5 F (37.5 C)] 99.1 F (37.3 C) (11/09 0800) Pulse Rate:  [72-106] 83 (11/09 1000) Resp:  [16-27] 17 (11/09 1000) BP: (124-177)/(46-96) 156/70 mmHg (11/09 1000) SpO2:  [100 %] 100 % (11/09 1000) FiO2 (%):  [40 %] 40 % (11/09 0800) Weight:  [60 kg (132 lb 4.4 oz)] 60 kg (132 lb 4.4 oz) (11/09 0500)   HEMODYNAMICS: CVP:  [10 mmHg-25 mmHg] 22 mmHg  VENTILATOR SETTINGS: Vent Mode:  [-] PRVC FiO2 (%):  [40 %] 40 % Set Rate:  [15 bmp] 15 bmp Vt Set:  [370 mL] 370 mL PEEP:  [5 cmH20] 5 cmH20 Plateau Pressure:  [15 cmH20-20 cmH20] 20 cmH20  INTAKE / OUTPUT:  Intake/Output Summary (Last 24 hours) at 08/02/14 1058 Last data filed at 08/02/14 1000  Gross per 24 hour  Intake 5722.33 ml  Output   2265 ml   Net 3457.33 ml    PHYSICAL EXAMINATION: General: no distress, sedated on fentanyl HEENT: Rt frontal suture line intact bruising R eye, ett PULM: CTA B CV RRR no mgr Ab: soft, mild distention, gurgling bowel sounds Ext: warm, ++ edema Neuro: RASS -1,No  follows commands  LABS:  CBC  Recent Labs Lab 07/31/14 0500 08/01/14 0447 08/02/14 0540  WBC 15.1* 10.1 9.6  HGB 8.6* 8.3* 7.2*  HCT 27.4* 27.0* 22.9*  PLT 199 202 189   Coag's  Recent Labs Lab 07/26/14 2114 07/27/14 0925  APTT 42*  --   INR 2.52* 1.19   BMET  Recent Labs Lab 07/31/14 0500 08/01/14 0447 08/02/14 0540  NA 152* 149* 144  K 3.2* 3.0* 3.4*  CL 121* 116* 110  CO2 19 22 23   BUN 16 14 11   CREATININE 0.89 0.94 0.72  GLUCOSE 186* 159* 195*   Electrolytes  Recent Labs Lab 07/31/14 0500 08/01/14 0447 08/02/14 0540  CALCIUM 8.2* 8.0* 8.4  MG  --  1.6 2.1  PHOS  --  2.0* 2.9   Sepsis Markers No results for input(s): LATICACIDVEN, PROCALCITON, O2SATVEN in the last 168 hours.   ABG  Recent Labs Lab 07/27/14 1605 07/27/14 1649 07/27/14 1836  PHART 7.331* 7.479* 7.431  PCO2ART 43.0 30.4* 30.5*  PO2ART 536.0* 504.0* 541.0*   Cardiac Enzymes  Recent Labs Lab 07/28/14 0430 07/28/14 1126 08/01/14 0447  TROPONINI <0.30 <0.30  --  PROBNP  --   --  683.6*   Glucose  Recent Labs Lab 08/01/14 1158 08/01/14 1540 08/01/14 1959 08/01/14 2334 08/02/14 0344 08/02/14 0836  GLUCAP 162* 115* 190* 167* 168* 180*    Imaging Ct Head Wo Contrast  08/01/2014   CLINICAL DATA:  Subarachnoid hemorrhage. Status post craniotomy for clipping of anterior communicating artery aneurysm.  EXAM: CT HEAD WITHOUT CONTRAST  TECHNIQUE: Contiguous axial images were obtained from the base of the skull through the vertex without intravenous contrast.  COMPARISON:  CT head without contrast 07/28/2014. CTA head 07/26/2014.  FINDINGS: Patient is status post right pterional craniotomy for clipping of aneurysm.  No new hemorrhage is present. Subarachnoid blood in the anterior frontal lobes bilaterally continues resolved. There is some hyperdense blood just superior to the aneurysm clips, unchanged. There is persistent layering hemorrhage within the ventricles. The ventricular tips and atrial lateral ventricles are slightly more prominent than on the prior study. The third ventricle is of normal size. The fourth ventricle is of normal size. The calcified pineal gland is again seen.  Evolving infarcts involving the anterior right basal ganglia as well as the anterior corpus callosum and inferior anterior frontal lobes bilaterally are noted. These changes demonstrate evolution of infarcts apparent on the prior studies. Additional punctate subcortical white matter infarcts are noted bilaterally as well.  Extensive scalp soft tissue swelling is still seen with fluid in gas. Skin staples are in place.  Minimal mucosal thickening is present in the left maxillary sinus. The remaining paranasal sinuses are clear. There is some fluid in the left mastoid air cells.  IMPRESSION: 1. Continued resolution of subarachnoid hemorrhage involving the frontal lobes bilaterally. 2. Stable appearance of aneurysm clips via a right pterional craniotomy. 3. Continued evolution of anterior right basal ganglia and bilateral inferior anterior frontal lobe infarcts. 4. 2 or 3 scattered subcortical areas of white matter hypoattenuation likely represent small infarcts as well, demonstrating evolution since the prior study. No definite new infarct is evident. 5. Persistent soft tissue swelling and gas at the incision site, increased since the prior study.   Electronically Signed   By: Gennette Pachris  Mattern M.D.   On: 08/01/2014 14:28   Dg Chest Port 1 View  08/01/2014   CLINICAL DATA:  63 year old female currently admitted with respiratory failure.  EXAM: PORTABLE CHEST - 1 VIEW  COMPARISON:  Most recent prior chest x-ray 07/31/2014  FINDINGS: Endotracheal  tube 3.2 cm above the carina. Right IJ central venous catheter with the tip overlying the mid SVC. Nasogastric tube coiled within the stomach.  Cardiac and mediastinal contours remain within normal limits. Stable appearance of relative pulmonary hyperinflation with bibasilar scarring versus atelectasis. No significant interval change in the appearance of the lungs. No large pleural effusion, evidence of a pneumothorax or focal airspace consolidation.  IMPRESSION: 1. Bilateral lower lobe subsegmental atelectasis versus scarring. Early infiltrate would be difficult to exclude entirely. 2. Stable and satisfactory support apparatus.   Electronically Signed   By: Malachy MoanHeath  McCullough M.D.   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 COPD, not in exacerbation. Concern for upper airway edema 11/05. Not weanable 11/7 P:   Full vent support F/u CXR and ABG Decadron started 11/05 >> continue, and re-assess cuff leak >> might need eval by ENT once neuro status improves enough to consider extubation Pressure support wean as tolerated PRN BD's Will need diureses once neuro status is more stable to consider weaning.  CARDIOVASCULAR Rt IJ CVL 11/03 >> A:  HHH therapy in setting of SAH P:  CVP monitoring 11/8 cvp 12 Levophed for MAP goal > 65 Continue IVF as per NS .45 saline at 120 cc/hr, will need active diureses prior to any serious consideration of extubation or weaning.  RENAL  Recent  Labs Lab 07/31/14 0500 08/01/14 0447 08/02/14 0540  NA 152* 149* 144   Recent Labs Lab 07/31/14 0500 08/01/14 0447 08/02/14 0540  K 3.2* 3.0* 3.4*   A:   Mild hypernatremia Hypokalemia hypophosphoremia  P:   F/u BMET Replete K+ On 1/2 ns per ns at 120 cc /hr, lasix d/ced, will hold off diureses until ok with NS. Free H20 11/8 ,100 cc q 8 h  GASTROINTESTINAL A:   Nutrition P:   Tube feeds while on vent SUP: pepcid  HEMATOLOGIC A:   Anemia of critical illness. Leukocytosis. P:  F/u CBC SCD for DVT prevention  INFECTIOUS A:   Sepsis 11/04 ?source P:   Day 8 of Abx, currently on zosyn/vancomycin >> narrow Abx once cx results final  Blood 11/02 >>NTD Blood 11/03 >>NTD Blood 11/04 >>NTD C diff 11/06 >> neg Sputum 11/2>>neg UC 11-2>>neg  ENDOCRINE A:   No acute issues P:   SSI while on tube feeds  FAMILY  - Updates: No family bedside  TODAY'S SUMMARY:  Subarachnoid hemorrhage, now s/p surgical clipping.  Continue Abx while awaiting cx results.  Concern for upper airway edema preventing extubation trial >> once neuro status is declared then will need to discuss trial extubation vs progression to trach/peg.  CC time 35 min.  Alyson Reedy, M.D. [facility] Pulmonary/Critical Care Medicine. Pager: [phone]. After hours pager: [phone].

## 2014-08-02 NOTE — Progress Notes (Signed)
VASCULAR LAB PRELIMINARY  PRELIMINARY  PRELIMINARY  PRELIMINARY  TCD  completed.    Preliminary report:  Transcranial Doppler completed   Tanicia Wolaver, RVS 08/02/2014, 7:44 PM

## 2014-08-02 NOTE — Progress Notes (Addendum)
NUTRITION FOLLOW-UP  INTERVENTION:  Increase Vital High Protein to 45 ml/hr via OG tube   Tube feeding regimen provides 1080 kcal (97% of needs), 94 grams of protein, and 902 ml of H2O.   NUTRITION DIAGNOSIS: Inadequate oral intake related to inability to eat as evidenced by NPO status; ongoing.   Goal: Pt to meet >/= 90% of their estimated nutrition needs; met.    Monitor:  Respiratory status, TF tolerance, weight trend  ASSESSMENT: Pt with hx of severe COPD admitted for [facility].  Pt s/p angiogram and clipping.   Patient is currently intubated on ventilator support MV: 7 L/min Temp (24hrs), Avg:98.9 F (37.2 C), Min:98.2 F (36.8 C), Max:99.5 F (37.5 C)  Propofol: off  Potassium low. Has OG tube in place.  Pt discussed during ICU rounds and with RN. Per RN pt will remain intubated today.  Pt needs lasix per RN but receiving IV albumin for now.   Height: Ht Readings from Last 1 Encounters:  08/01/14 5' (1.524 m)    Weight: Wt Readings from Last 1 Encounters:  08/02/14 132 lb 4.4 oz (60 kg)  Admission weight: 102 lb (46.7 kg) 11/2 Pt positive 12 L since admission   BMI:  Body mass index is 25.83 kg/(m^2).  Estimated Nutritional Needs: Kcal: 984 Protein: 65-80 grams Fluid: > 1.5 L/day  Skin: WDL  Diet Order: Diet NPO time specified   Intake/Output Summary (Last 24 hours) at 08/02/14 1126 Last data filed at 08/02/14 1100  Gross per 24 hour  Intake 5713.33 ml  Output   2265 ml  Net 3448.33 ml    Last BM: 11/9   Labs:   Recent Labs Lab 07/31/14 0500 08/01/14 0447 08/02/14 0540  NA 152* 149* 144  K 3.2* 3.0* 3.4*  CL 121* 116* 110  CO2 _0 BUN _1 CREATININE 0.89 0.94 0.72  CALCIUM 8.2* 8.0* 8.4  MG  --  1.6 2.1  PHOS  --  2.0* 2.9  GLUCOSE 186* 159* 195*    CBG (last 3)   Recent Labs  08/01/14 2334 08/02/14 0344 08/02/14 0836  GLUCAP 167* 168* 180*    Scheduled Meds: . albumin human  12.5 g Intravenous Q6H  .  antiseptic oral rinse  7 mL Mouth Rinse QID  . chlorhexidine  15 mL Mouth Rinse BID  . famotidine  20 mg Per Tube Q12H  . feeding supplement (VITAL HIGH PROTEIN)  1,000 mL Per Tube Q24H  . insulin aspart  0-20 Units Subcutaneous 6 times per day  . ipratropium-albuterol  3 mL Nebulization Q4H  . levETIRAcetam  500 mg Intravenous Q12H  . NiMODipine  60 mg Per Tube Q4H  . piperacillin-tazobactam (ZOSYN)  IV  3.375 g Intravenous Q8H  . potassium chloride  40 mEq Oral TID  . simvastatin  40 mg Oral q1800  . vancomycin  1,000 mg Intravenous Q24H    Continuous Infusions: . sodium chloride 120 mL/hr at 08/02/14 0350  . fentaNYL infusion INTRAVENOUS 50 mcg/hr (08/01/14 0820)    [place], [place], [place] Pager [phone] After Hours Pager

## 2014-08-03 ENCOUNTER — Inpatient Hospital Stay (HOSPITAL_COMMUNITY): Payer: 59

## 2014-08-03 ENCOUNTER — Encounter (HOSPITAL_COMMUNITY): Payer: Self-pay

## 2014-08-03 DIAGNOSIS — R40244 Other coma, without documented Glasgow coma scale score, or with partial score reported: Secondary | ICD-10-CM

## 2014-08-03 LAB — MAGNESIUM: Magnesium: 1.8 mg/dL (ref 1.5–2.5)

## 2014-08-03 LAB — BASIC METABOLIC PANEL
Anion gap: 10 (ref 5–15)
BUN: 8 mg/dL (ref 6–23)
CALCIUM: 8.4 mg/dL (ref 8.4–10.5)
CO2: 23 mEq/L (ref 19–32)
CREATININE: 0.6 mg/dL (ref 0.50–1.10)
Chloride: 107 mEq/L (ref 96–112)
GFR calc non Af Amer: >90 mL/min (ref >90)
Glucose, Bld: 154 mg/dL — ABNORMAL HIGH (ref 70–99)
Potassium: 3.6 mEq/L — ABNORMAL LOW (ref 3.7–5.3)
Sodium: 140 mEq/L (ref 137–147)

## 2014-08-03 LAB — GLUCOSE, CAPILLARY
GLUCOSE-CAPILLARY: 104 mg/dL — AB (ref 70–99)
GLUCOSE-CAPILLARY: 132 mg/dL — AB (ref 70–99)
GLUCOSE-CAPILLARY: 152 mg/dL — AB (ref 70–99)
GLUCOSE-CAPILLARY: 161 mg/dL — AB (ref 70–99)
Glucose-Capillary: 201 mg/dL — ABNORMAL HIGH (ref 70–99)
Glucose-Capillary: 138 mg/dL — ABNORMAL HIGH (ref 70–99)
Glucose-Capillary: 132 mg/dL — ABNORMAL HIGH (ref 70–99)

## 2014-08-03 LAB — BLOOD GAS, ARTERIAL
Acid-base deficit: 2.8 mmol/L — ABNORMAL HIGH (ref 0.0–2.0)
BICARBONATE: 20.9 meq/L (ref 20.0–24.0)
Drawn by: 39898
FIO2: 0.4 %
MECHVT: 370 mL
O2 Saturation: 99.2 %
PCO2 ART: 32.5 mmHg — AB (ref 35.0–45.0)
PEEP: 5 cmH2O
PH ART: 7.423 (ref 7.350–7.450)
Patient temperature: 98.6
RATE: 15 resp/min
TCO2: 21.9 mmol/L (ref 0–100)
pO2, Arterial: 170 mmHg — ABNORMAL HIGH (ref 80.0–100.0)

## 2014-08-03 LAB — CBC
HEMATOCRIT: 22.6 % — AB (ref 36.0–46.0)
Hemoglobin: 7.2 g/dL — ABNORMAL LOW (ref 12.0–15.0)
MCH: 27.9 pg (ref 26.0–34.0)
MCHC: 31.9 g/dL (ref 30.0–36.0)
MCV: 87.6 fL (ref 78.0–100.0)
Platelets: 256 10*3/uL (ref 150–400)
RBC: 2.58 MIL/uL — ABNORMAL LOW (ref 3.87–5.11)
RDW: 14.5 % (ref 11.5–15.5)
WBC: 12.9 10*3/uL — ABNORMAL HIGH (ref 4.0–10.5)

## 2014-08-03 LAB — CULTURE, BLOOD (ROUTINE X 2)
CULTURE: NO GROWTH
Culture: NO GROWTH

## 2014-08-03 LAB — PHOSPHORUS: Phosphorus: 2.1 mg/dL — ABNORMAL LOW (ref 2.3–4.6)

## 2014-08-03 MED ORDER — IOHEXOL 350 MG/ML SOLN
90.0000 mL | Freq: Once | INTRAVENOUS | Status: AC | PRN
  Administered 2014-08-03: 90 mL via INTRAVENOUS

## 2014-08-03 NOTE — Progress Notes (Signed)
Pt seen and examined. No issues overnight. PICC line placed.  EXAM: Temp:  [98.5 F (36.9 C)-98.9 F (37.2 C)] 98.5 F (36.9 C) (11/10 0800) Pulse Rate:  [78-111] 97 (11/10 0900) Resp:  [12-31] 20 (11/10 0900) BP: (151-202)/(45-161) 181/60 mmHg (11/10 0900) SpO2:  [97 %-100 %] 100 % (11/10 0900) FiO2 (%):  [40 %] 40 % (11/10 0800) Weight:  [66.4 kg (146 lb 6.2 oz)] 66.4 kg (146 lb 6.2 oz) (11/10 0500) Intake/Output      11/09 0701 - 11/10 0700 11/10 0701 - 11/11 0700   I.V. (mL/kg) 3377.6 (50.9) 267.6 (4)   NG/GT 856    IV Piggyback 1550 300   Total Intake(mL/kg) 5783.6 (87.1) 567.6 (8.5)   Urine (mL/kg/hr) 1935 (1.2)    Stool 450 (0.3)    Total Output 2385     Net +3398.6 +567.6         Opens eyes to voice Moves right side spontaneously, not FC Some spontaneous movement of LUE. No movement of LLE seen Wound c/d/i  LABS: Lab Results  Component Value Date   CREATININE 0.60 08/03/2014   BUN 8 08/03/2014   NA 140 08/03/2014   K 3.6* 08/03/2014   CL 107 08/03/2014   CO2 23 08/03/2014   Lab Results  Component Value Date   WBC 12.9* 08/03/2014   HGB 7.2* 08/03/2014   HCT 22.6* 08/03/2014   MCV 87.6 08/03/2014   PLT 256 08/03/2014    IMAGING: CTA demonstrates likely moderate spasm of bilateral A1 and A2 segments CTP demonstrates increased MTT in ACA territory, but preserved CBF and CBV x in areas of known stroke (right huebner territory/left orbitofrontal cortex)  IMPRESSION: - 63 y.o. female SAH d# 8 s/p Acom aneurysm clipping - Relatively stable neurologically. Some spasm of ACAs.  PLAN: - Cont hyperdynamic therapy with fluid/colloid for volume and Levophed for SBP 180-28000mmHg. I do not see a need for IA treatment at this point as medical therapy appears to provide adequate CBF. - Plan on early trach as need for mech vent/airway protection is likely to be for the foreseeable future

## 2014-08-03 NOTE — Plan of Care (Signed)
Problem: Phase II Progression Outcomes Goal: Tolerating diet Outcome: Not Progressing

## 2014-08-03 NOTE — Progress Notes (Signed)
PT Cancellation Note  Patient Details Name: Stephanie Doyle MRN: [id number] DOB: 1951-05-07   Cancelled Treatment:    Reason Eval/Treat Not Completed: Patient not medically ready.  Pt on pressors to keep BP systolic 180-200.  Will hold PT at this time and check back tomorrow.  Please advise if pt is not appropriate for PT.     Lateria Alderman, Alison MurrayMegan F 08/03/2014, 9:16 AM

## 2014-08-03 NOTE — Progress Notes (Signed)
Pt transported to CT on vent. No complications noted.

## 2014-08-03 NOTE — Progress Notes (Signed)
ANTIBIOTIC CONSULT NOTE - FOLLOW UP  Pharmacy Consult for vancomycin and zosyn Indication: PNA  No Known Allergies  Patient Measurements: Height: 5' (152.4 cm) Weight: 146 lb 6.2 oz (66.4 kg) IBW/kg (Calculated) : 45.5   Vital Signs: Temp: 98.1 F (36.7 C) (11/10 1141) Temp Source: Axillary (11/10 1141) BP: 203/61 mmHg (11/10 1000) Pulse Rate: 93 (11/10 1116) Intake/Output from previous day: 11/09 0701 - 11/10 0700 In: 5783.6 [I.V.:3377.6; NG/GT:856; IV Piggyback:1550] Out: 2385 [Urine:1935; Stool:450] Intake/Output from this shift: Total I/O In: 567.6 [I.V.:267.6; IV Piggyback:300] Out: -   Labs:  Recent Labs  08/01/14 0447 08/02/14 0540 08/03/14 0555  WBC 10.1 9.6 12.9*  HGB 8.3* 7.2* 7.2*  PLT 202 189 256  CREATININE 0.94 0.72 0.60   Estimated Creatinine Clearance: 59.7 mL/min (by C-G formula based on Cr of 0.6). No results for input(s): VANCOTROUGH, VANCOPEAK, VANCORANDOM, GENTTROUGH, GENTPEAK, GENTRANDOM, TOBRATROUGH, TOBRAPEAK, TOBRARND, AMIKACINPEAK, AMIKACINTROU, AMIKACIN in the last 72 hours.   Microbiology: Recent Results (from the past 720 hour(s))  MRSA PCR Screening     Status: None   Collection Time: 07/26/14  7:58 PM  Result Value Ref Range Status   MRSA by PCR NEGATIVE NEGATIVE Final    Comment:        The GeneXpert MRSA Assay (FDA approved for NASAL specimens only), is one component of a comprehensive MRSA colonization surveillance program. It is not intended to diagnose MRSA infection nor to guide or monitor treatment for MRSA infections.   Culture, Urine     Status: None   Collection Time: 07/26/14 11:12 PM  Result Value Ref Range Status   Specimen Description URINE, CATHETERIZED  Final   Special Requests NONE  Final   Culture  Setup Time   Final    07/27/2014 10:07 Performed at [facility]    Colony Count NO GROWTH Performed at [facility]   Final   Culture NO GROWTH Performed at [facility]    Final   Report Status 07/28/2014 FINAL  Final  Culture, blood (routine x 2)     Status: None   Collection Time: 07/26/14 11:49 PM  Result Value Ref Range Status   Specimen Description BLOOD LEFT ARM  Final   Special Requests BOTTLES DRAWN AEROBIC ONLY 5CC  Final   Culture  Setup Time   Final    07/27/2014 08:58 Performed at [facility]    Culture   Final    NO GROWTH 5 DAYS Performed at [facility]    Report Status 08/02/2014 FINAL  Final  Culture, respiratory (NON-Expectorated)     Status: None   Collection Time: 07/26/14 11:52 PM  Result Value Ref Range Status   Specimen Description TRACHEAL ASPIRATE  Final   Special Requests NONE  Final   Gram Stain   Final    RARE WBC PRESENT, PREDOMINANTLY PMN RARE SQUAMOUS EPITHELIAL CELLS PRESENT RARE GRAM POSITIVE COCCI IN PAIRS Performed at [facility]    Culture   Final    Non-Pathogenic Oropharyngeal-type Flora Isolated. Performed at [facility]    Report Status 07/29/2014 FINAL  Final  Culture, blood (routine x 2)     Status: None   Collection Time: 07/27/14 12:04 AM  Result Value Ref Range Status   Specimen Description BLOOD LEFT HAND  Final   Special Requests BOTTLES DRAWN AEROBIC ONLY 4CC  Final   Culture  Setup Time   Final    07/27/2014 08:58 Performed at [facility]    Culture   Final    NO GROWTH 5 DAYS [facility]formed at [facility]    Report Status 08/02/2014 FINAL  Final  Culture, respiratory (NON-Expectorated)     Status: None   Collection Time: 07/28/14  8:49 AM  Result Value Ref Range Status   Specimen Description TRACHEAL ASPIRATE  Final   Special Requests Normal  Final   Gram Stain   Final    FEW WBC PRESENT,BOTH PMN AND MONONUCLEAR NO SQUAMOUS EPITHELIAL CELLS SEEN NO ORGANISMS SEEN [facility]formed at [facility]    Culture   Final    Non-Pathogenic Oropharyngeal-type Flora Isolated. Performed at [facility]    Report Status 07/30/2014  FINAL  Final  Culture, blood (routine x 2)     Status: None   Collection Time: 07/28/14  9:43 AM  Result Value Ref Range Status   Specimen Description BLOOD LEFT HAND  Final   Special Requests BOTTLES DRAWN AEROBIC ONLY 10CC  Final   Culture  Setup Time   Final    07/28/2014 15:42 Performed at [facility]    Culture   Final    NO GROWTH 5 DAYS Performed at [facility]    Report Status 08/03/2014 FINAL  Final  Culture, blood (routine x 2)     Status: None   Collection Time: 07/28/14 10:00 AM  Result Value Ref Range Status   Specimen Description BLOOD LEFT HAND  Final   Special Requests BOTTLES DRAWN AEROBIC ONLY 8CC  Final   Culture  Setup Time   Final    07/28/2014 15:42 Performed at [facility]    Culture   Final    NO GROWTH 5 DAYS Performed at [facility]    Report Status 08/03/2014 FINAL  Final  Clostridium Difficile by PCR     Status: None   Collection Time: 07/30/14  8:06 AM  Result Value Ref Range Status   C difficile by pcr NEGATIVE NEGATIVE Final    Anti-infectives    Start     Dose/Rate Route Frequency Ordered Stop   07/29/14 0930  vancomycin (VANCOCIN) IVPB 1000 mg/200 mL premix     1,000 mg200 mL/hr over 60 Minutes Intravenous Every 24 hours 07/28/14 0941     07/28/14 0930  piperacillin-tazobactam (ZOSYN) IVPB 3.375 g     3.375 g12.5 mL/hr over 240 Minutes Intravenous Every 8 hours 07/28/14 0916     07/28/14 0930  vancomycin (VANCOCIN) IVPB 1000 mg/200 mL premix     1,000 mg200 mL/hr over 60 Minutes Intravenous  Once 07/28/14 0916 07/28/14 1100   07/27/14 1704  bacitracin 50,000 Units in sodium chloride irrigation 0.9 % 500 mL irrigation  Status:  Discontinued       As needed 07/27/14 1704 07/27/14 2111   07/26/14 2300  ampicillin-sulbactam (UNASYN) 1.5 g in sodium chloride 0.9 % 50 mL IVPB  Status:  Discontinued     1.5 g100 mL/hr over 30 Minutes Intravenous Every 6 hours 07/26/14 2213 07/28/14 0422       Assessment: Patient is a 63 y.o F on vancomycin and zosyn (Day #9) for PNA.  Per CCM note, plan to d/c at 10 days so tomorrow. Afeb, wbc slightly elevated, scr remains stable. Good UOP.  Unasyn 11/2>>11/4 Vanc 11/4>> Zosyn 11/4>>  MRSA pcr (-) 11/2 TA>> normal flora 11/06 TA>> neg  11/06 cdiff> neg  11/04 bcx x2>> neg 11/02 ucx >> neg  11/02 bcx>> neg 11/03 bcx  x2>  neg   Goal of Therapy:  Vancomycin trough level 15-20 mcg/ml  Plan:  1. Continue zosyn 3.375gm IV Q8H (4 hr inf) 2. Continue vanc 1gm IV Q24H 3. F/u renal fxn, C&S, clinical status 4. Will not obtain level as antibiotics to d/c tomorrow.  Christoper Fabianaron Mulan Adan, PharmD, BCPS Clinical pharmacist, pager [phone] 08/03/2014,1:15 PM

## 2014-08-03 NOTE — Plan of Care (Signed)
Problem: Phase I Progression Outcomes Goal: Neuro exam at baseline or improved Outcome: Progressing Goal: Respiratory status stable Outcome: Progressing Goal: Pain controlled with appropriate interventions Outcome: Progressing

## 2014-08-03 NOTE — Progress Notes (Signed)
PULMONARY / CRITICAL CARE MEDICINE   Name: Stephanie Doyle MRN: [id number] DOB: May 16, 1951    ADMISSION DATE:  07/26/2014 CONSULTATION DATE:  07/26/2014  REFERRING MD :  Dr. Franky Machoabbell  CHIEF COMPLAINT:  Headache  INITIAL PRESENTATION: 63 year old female with no known medical history [facility]2 with severe headache, found to have SAH. AMS in ED requiring intubation. [facility] PCCM consulted for vent and ICU management.  STUDIES:  11/2 CT head > diffuse SAH, frontal and temporal bilaterally.  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, complex 11/5 - no cuff leak >> started steroids 11/7 not weaning , lowest ps was 12  SUBJECTIVE:   NSC PS 12 currently  VITAL SIGNS: Temp:  [98.5 F (36.9 C)-99.1 F (37.3 C)] 98.8 F (37.1 C) (11/10 0300) Pulse Rate:  [78-111] 103 (11/10 0400) Resp:  [12-31] 22 (11/10 0400) BP: (146-198)/(56-161) 182/58 mmHg (11/10 0400) SpO2:  [97 %-100 %] 100 % (11/10 0400) FiO2 (%):  [40 %] 40 % (11/10 0400)   HEMODYNAMICS: CVP:  [2 mmHg-25 mmHg] 16 mmHg  VENTILATOR SETTINGS: Vent Mode:  [-] PRVC FiO2 (%):  [40 %] 40 % Set Rate:  [15 bmp] 15 bmp Vt Set:  [370 mL] 370 mL PEEP:  [5 cmH20] 5 cmH20 Plateau Pressure:  [20 cmH20-25 cmH20] 21 cmH20  INTAKE / OUTPUT:  Intake/Output Summary (Last 24 hours) at 08/03/14 0537 Last data filed at 08/03/14 0400  Gross per 24 hour  Intake 5663.44 ml  Output   2055 ml  Net 3608.44 ml    PHYSICAL EXAMINATION: General: no  distress, sedated on fentanyl HEENT: Rt frontal suture line intact bruising R eye, ett PULM: CTA B CV RRR no mgr Ab: soft, mild distention, gurgling bowel sounds Ext: warm, ++ edema Neuro: RASS -1,No  follows commands, opens eyes to painful stimuli but not command.  LABS:  CBC  Recent Labs Lab 07/31/14 0500 08/01/14 0447 08/02/14 0540  WBC 15.1* 10.1 9.6  HGB 8.6* 8.3* 7.2*  HCT 27.4* 27.0* 22.9*  PLT 199 202 189   Coag's  Recent Labs Lab 07/27/14 0925  INR 1.19   BMET  Recent Labs Lab 07/31/14 0500 08/01/14 0447 08/02/14 0540  NA 152* 149* 144  K 3.2* 3.0* 3.4*  CL 121* 116* 110  CO2 19 22 23   BUN 16 14 11   CREATININE 0.89 0.94 0.72  GLUCOSE 186* 159* 195*   Electrolytes  Recent Labs Lab 07/31/14 0500 08/01/14 0447 08/02/14 0540  CALCIUM 8.2* 8.0* 8.4  MG  --  1.6 2.1  PHOS  --  2.0* 2.9   Sepsis Markers No results for input(s): LATICACIDVEN, PROCALCITON, O2SATVEN in the last 168 hours.   ABG  Recent Labs Lab 07/27/14 1649 07/27/14 1836 08/03/14 0500  PHART 7.479* 7.431 7.423  PCO2ART 30.4* 30.5* 32.5*  PO2ART 504.0* 541.0* 170.0*   Cardiac Enzymes  Recent Labs Lab 07/28/14 0430 07/28/14 1126 08/01/14 0447  TROPONINI <0.30 <0.30  --   PROBNP  --   --  683.6*   Glucose  Recent Labs Lab 08/02/14  1610 08/02/14 1158 08/02/14 1612 08/02/14 1959 08/02/14 2327 08/03/14 0336  GLUCAP 180* 195* 184* 215* 201* 161*    Imaging Ct Head Wo Contrast  08/02/2014   CLINICAL DATA:  Aneurysm clipping. Evaluate for vasospasm of cerebral artery.  EXAM: CT HEAD WITHOUT CONTRAST  TECHNIQUE: Contiguous axial images were obtained from the base of the skull through the vertex without intravenous contrast.  COMPARISON:  CT 08/01/2014  FINDINGS: CTA of the head and CT perfusion was attempted however there was extravasation of contrast at the IV site and no contrast was administered intravascularly. Precontrast CT the head was performed. Patient will  return for CTA once adequate IV access is obtained.  Intraventricular hemorrhage in the left occipital horn is unchanged. Mild amount of interhemispheric subarachnoid hemorrhage also unchanged.  Aneurysm clips in the region of the anterior communicating artery. Low density is present in the right anterior basal ganglia and anterior cingulate gyrus unchanged. There is low-density edema/ infarction in the inferior frontal lobes bilaterally, unchanged. No new area of infarction.  Negative for shift of the midline structures. Negative for hydrocephalus.  IMPRESSION: CTA head and CT perfusion not obtained due to extravasation of contrast.  Unenhanced head CT unchanged from yesterday with intracranial hemorrhage and areas of cerebral infarction.   Electronically Signed   By: Marlan Palau M.D.   On: 08/02/2014 20:02   Dg Chest Port 1 View  08/02/2014   CLINICAL DATA:  Respiratory failure.  EXAM: PORTABLE CHEST - 1 VIEW  COMPARISON:  08/01/2014  FINDINGS: Right IJ central line tip overlies the level of the superior vena cava. Endotracheal tube is in place with tip 3.0 cm above chronic. Nasogastric tube is in place with tip off the film but beyond the level of the mid stomach.  Heart size is upper limits normal. There is patchy density at the right lung base associated with small pleural effusion. Minimal left lower lobe atelectasis or early infiltrate is also noted. Suspect mild interstitial pulmonary edema.  IMPRESSION: Increased bibasilar infiltrates/atelectasis and small effusions.  Increased interstitial edema.   Electronically Signed   By: Rosalie Gums M.D.   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 RASS goal: 0  Fentanyl gtt for analgesia/sedation Nimodipine for spasm Started statin 11/4 Daily WUA  PULMONARY OETT 11/2 >>> A: Acute hypoxemic respiratory failure 2nd to impaired airway protection. COPD, not in exacerbation. Concern for upper airway edema 11/05. Not weanable 11/7 P:   Full vent support, no weaning given airway edema and lack of cuff leak. F/u CXR and ABG Decadron started 11/05 >> continue, and re-assess cuff leak Spoke with NS, recommend early trach, no family bedside today, will call tomorrow and discuss case and get consent likely for later on this week. PRN BD's Will need diureses once neuro status is more stable to consider weaning.  CARDIOVASCULAR Rt IJ CVL 11/03 >> A:  HHH therapy in setting of SAH P:  CVP monitoring Levophed for SBP goal 180-200 Continue IVF as per NS .45 saline at 120 cc/hr, will need active  diureses prior to any serious consideration of weaning.  RENAL  Recent Labs Lab 07/31/14 0500 08/01/14 0447 08/02/14 0540  NA 152* 149* 144    Recent Labs Lab 07/31/14 0500 08/01/14 0447 08/02/14 0540  K 3.2* 3.0* 3.4*   A:   Mild hypernatremia Hypokalemia hypophosphoremia  P:   F/u BMET Replete electrolytes as indicated On 1/2 ns per ns at 120 cc /hr, lasix d/ced, will hold off diureses until ok with NS. Free H20 11/8 ,100 cc q 8 h  GASTROINTESTINAL A:   Nutrition P:   Tube feeds while on vent SUP: pepcid  HEMATOLOGIC A:   Anemia of critical illness. Leukocytosis. P:  F/u CBC SCD for DVT prevention  INFECTIOUS A:   Sepsis 11/04 ?source P:   Day 9 of Abx, currently on zosyn/vancomycin >> will finish a 10 day course  Blood 11/02 >>NTD Blood 11/03 >>NTD Blood 11/04 >>NTD C diff 11/06 >> neg Sputum 11/2>>neg UC 11-2>>neg  ENDOCRINE A:   No acute issues P:   SSI while on tube feeds  FAMILY  -  Updates: No family bedside  TODAY'S SUMMARY:  Subarachnoid hemorrhage, now s/p surgical clipping.  Continue Abx for 10 days since all results are negative.  Concern for upper airway edema preventing extubation trial, given neuro status will proceed with early trach after speaking with family.  CC time 35 min.  Alyson ReedyWesam G. Yacoub, M.D. [facility]monary/[facility] Care Medicine. Pager: [phone]. After hours pager: [phone].

## 2014-08-03 NOTE — Progress Notes (Signed)
2233 paged Elink regarding tube feed residual >450 for 3 consecutive times. Orders given to turn tube feed off and reassess in the a.m.

## 2014-08-04 LAB — BASIC METABOLIC PANEL
Anion gap: 11 (ref 5–15)
BUN: 10 mg/dL (ref 6–23)
CHLORIDE: 106 meq/L (ref 96–112)
CO2: 24 mEq/L (ref 19–32)
Calcium: 8.3 mg/dL — ABNORMAL LOW (ref 8.4–10.5)
Creatinine, Ser: 0.65 mg/dL (ref 0.50–1.10)
GLUCOSE: 181 mg/dL — AB (ref 70–99)
Potassium: 3.2 mEq/L — ABNORMAL LOW (ref 3.7–5.3)
SODIUM: 141 meq/L (ref 137–147)

## 2014-08-04 LAB — MAGNESIUM: MAGNESIUM: 1.8 mg/dL (ref 1.5–2.5)

## 2014-08-04 LAB — GLUCOSE, CAPILLARY
GLUCOSE-CAPILLARY: 136 mg/dL — AB (ref 70–99)
Glucose-Capillary: 157 mg/dL — ABNORMAL HIGH (ref 70–99)
Glucose-Capillary: 156 mg/dL — ABNORMAL HIGH (ref 70–99)
Glucose-Capillary: 166 mg/dL — ABNORMAL HIGH (ref 70–99)
Glucose-Capillary: 165 mg/dL — ABNORMAL HIGH (ref 70–99)

## 2014-08-04 LAB — CBC
HCT: 22.1 % — ABNORMAL LOW (ref 36.0–46.0)
HEMOGLOBIN: 7 g/dL — AB (ref 12.0–15.0)
MCH: 27.3 pg (ref 26.0–34.0)
MCHC: 31.7 g/dL (ref 30.0–36.0)
MCV: 86.3 fL (ref 78.0–100.0)
Platelets: 330 10*3/uL (ref 150–400)
RBC: 2.56 MIL/uL — ABNORMAL LOW (ref 3.87–5.11)
RDW: 15 % (ref 11.5–15.5)
WBC: 14.3 10*3/uL — AB (ref 4.0–10.5)

## 2014-08-04 LAB — PHOSPHORUS: PHOSPHORUS: 1.8 mg/dL — AB (ref 2.3–4.6)

## 2014-08-04 MED ORDER — MIDAZOLAM HCL 2 MG/2ML IJ SOLN
4.0000 mg | Freq: Once | INTRAMUSCULAR | Status: AC
  Administered 2014-08-05: 2 mg via INTRAVENOUS
  Filled 2014-08-04: qty 4

## 2014-08-04 MED ORDER — PROPOFOL 10 MG/ML IV EMUL
5.0000 ug/kg/min | Freq: Once | INTRAVENOUS | Status: AC
  Administered 2014-08-05: 10 ug/kg/min via INTRAVENOUS
  Filled 2014-08-04: qty 100

## 2014-08-04 MED ORDER — POTASSIUM CHLORIDE 20 MEQ/15ML (10%) PO SOLN
40.0000 meq | Freq: Three times a day (TID) | ORAL | Status: AC
  Administered 2014-08-04 (×2): 40 meq
  Filled 2014-08-04 (×2): qty 30

## 2014-08-04 MED ORDER — VECURONIUM BROMIDE 10 MG IV SOLR
10.0000 mg | Freq: Once | INTRAVENOUS | Status: AC
  Administered 2014-08-05: 10 mg via INTRAVENOUS
  Filled 2014-08-04 (×2): qty 10

## 2014-08-04 MED ORDER — ETOMIDATE 2 MG/ML IV SOLN
40.0000 mg | Freq: Once | INTRAVENOUS | Status: AC
  Administered 2014-08-05: 20 mg via INTRAVENOUS
  Filled 2014-08-04 (×2): qty 20

## 2014-08-04 MED ORDER — FENTANYL CITRATE 0.05 MG/ML IJ SOLN: 200.0000 ug | Freq: Once | INTRAMUSCULAR | Status: DC

## 2014-08-04 NOTE — Progress Notes (Signed)
PULMONARY / CRITICAL CARE MEDICINE   Name: Stephanie SilvasVicky D Cooner MRN: [id number] DOB: [birth year]    ADMISSION DATE:  07/26/2014 CONSULTATION DATE:  07/26/2014  REFERRING MD :  Dr. Franky Machoabbell  CHIEF COMPLAINT:  Headache  INITIAL PRESENTATION: 63 year old female with no known medical history [facility] 11/2 with severe headache, found to have SAH. AMS in ED requiring intubation. [facility]. PCCM consulted for vent and ICU management.  STUDIES:  11/2 CT head > diffuse SAH, frontal and temporal bilaterally.  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, complex 11/5 - no cuff leak >> started steroids 11/7 not weaning , lowest ps was 12  SUBJECTIVE:   NSC PS 12 currently  VITAL SIGNS: Temp:  [98.1 F (36.7 C)-99.6 F (37.6 C)] 99.6 F (37.6 C) (11/11 0754) Pulse Rate:  [91-117] 98 (11/11 0819) Resp:  [0-36] 22 (11/11 0819) BP: (142-208)/(45-111) 198/58 mmHg (11/11 0819) SpO2:  [98 %-100 %] 100 % (11/11 0819) FiO2 (%):  [40 %] 40 % (11/11 0819) Weight:  [68.4 kg (150 lb 12.7 oz)] 68.4 kg (150 lb 12.7 oz) (11/11 0414)   HEMODYNAMICS: CVP:  [16 mmHg] 16 mmHg  VENTILATOR SETTINGS: Vent Mode:  [-] PSV;CPAP FiO2 (%):  [40 %] 40 % Set Rate:  [15 bmp] 15 bmp Vt Set:  [370 mL] 370 mL PEEP:  [5 cmH20] 5 cmH20 Pressure Support:  [10 cmH20-12 cmH20] 12 cmH20 Plateau Pressure:  [24 cmH20-29 cmH20] 25 cmH20  INTAKE / OUTPUT:  Intake/Output Summary (Last 24 hours) at 08/04/14 1009 Last data filed at 08/04/14 0900   Gross per 24 hour  Intake 5717.6 ml  Output   2415 ml  Net 3302.6 ml    PHYSICAL EXAMINATION: General: no distress, sedated on fentanyl HEENT: Rt frontal suture line intact bruising R eye, ett PULM: CTA B CV RRR no mgr Ab: soft, mild distention, gurgling bowel sounds Ext: warm, ++ edema Neuro: RASS -1,No  follows commands, opens eyes to painful stimuli but not command.  LABS:  CBC  Recent Labs Lab 08/02/14 0540 08/03/14 0555 08/04/14 0430  WBC 9.6 12.9* 14.3*  HGB 7.2* 7.2* 7.0*  HCT 22.9* 22.6* 22.1*  PLT 189 256 330   Coag's No results for input(s): APTT, INR in the last 168 hours. BMET  Recent Labs Lab 08/02/14 0540 08/03/14 0555 08/04/14 0430  NA 144 140 141  K 3.4* 3.6* 3.2*  CL 110 107 106  CO2 23 23 24   BUN 11 8 10   CREATININE 0.72 0.60 0.65  GLUCOSE 195* 154* 181*   Electrolytes  Recent Labs Lab 08/02/14 0540 08/03/14 0555 08/04/14 0430  CALCIUM 8.4 8.4 8.3*  MG 2.1 1.8 1.8  PHOS 2.9 2.1* 1.8*   Sepsis Markers No results for input(s): LATICACIDVEN, PROCALCITON, O2SATVEN in the last 168 hours.   ABG  Recent Labs Lab 08/03/14 0500  PHART 7.423  PCO2ART 32.5*  PO2ART 170.0*   Cardiac Enzymes  Recent Labs Lab 07/28/14 1126 08/01/14 0447  TROPONINI <0.30  --   PROBNP  --  683.6*   Glucose  Recent  Labs Lab 08/03/14 1140 08/03/14 1652 08/03/14 1947 08/03/14 2333 08/04/14 0339 08/04/14 0753  GLUCAP 132* 104* 132* 152* 157* 156*    Imaging Ct Angio Head W/cm &/or Wo Cm  08/03/2014   CLINICAL DATA:  Status post clipping of ruptured anterior communicating artery aneurysm with subsequent strokes, assess for vasospasm.  EXAM: CT ANGIOGRAPHY HEAD  TECHNIQUE: Multidetector CT imaging of the head was performed using the standard protocol during bolus administration of intravenous contrast. Multiplanar CT image reconstructions and MIPs were obtained to evaluate the vascular anatomy. Axial perfusion imaging performed with post  processing.  CONTRAST:  90mL OMNIPAQUE IOHEXOL 350 MG/ML SOLN  COMPARISON:  CT of the head August 02, 2014 and CT angiogram of the head July 26, 2014  FINDINGS: CT perfusion: Relatively symmetric prolonged mean transit time in the bilateral anterior cerebral artery vascular territories, along the centrum semiovale. There is relatively preserved associated cerebral blood volume. Decreased blood volume and, matched prolonged mean transit time corresponding to known infarcts within the rostrum of the corpus callosum, RIGHT basal ganglia and LEFT frontal lobe.  CTA:  (delayed bolus timing limits evaluation)  Anterior circulation: Normal symmetric appearance of cervical, petrous, cavernous internal carotid arteries. Diminutive appearance of the A1 segments, with slightly irregular appearance of the A2 segments. Relatively normal appearance of the middle cerebral arteries.  Posterior communication: Relatively normal appearance of the vertebral arteries, vertebrobasilar junction and basilar artery. 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 Normal appearance of the posterior cerebral arteries.  Status post clipping of known anterior communicating artery aneurysm, no additional aneurysm identified. Status post RIGHT frontotemporal craniotomy. Layering blood products in the occipital horns.  Review of the MIP images confirms the above findings.  IMPRESSION: Prolonged mean transit time in bilateral anterior cerebral artery territories, with corresponding irregular A1 and A2 segments of the anterior cerebral arteries most consistent with vasospasm.  Matched perfusion defects corresponding to known infarcts.  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   Electronically Signed   By: Awilda Metro   On: 08/03/2014 03:31   Ct Cerebral Perfusion W/cm  08/03/2014   CLINICAL DATA:  Status post clipping of ruptured anterior communicating artery aneurysm  with subsequent strokes, assess for vasospasm.  EXAM: CT ANGIOGRAPHY HEAD  TECHNIQUE: Multidetector CT imaging of the head was performed using the standard protocol during bolus administration of intravenous contrast. Multiplanar CT image reconstructions and MIPs were obtained to evaluate the vascular anatomy. Axial perfusion imaging performed with post processing.  CONTRAST:  90mL OMNIPAQUE IOHEXOL 350 MG/ML SOLN  COMPARISON:  CT of the head August 02, 2014 and CT angiogram of the head July 26, 2014  FINDINGS: CT perfusion: Relatively symmetric prolonged mean transit time in the bilateral anterior cerebral artery vascular territories, along the centrum semiovale. There is relatively preserved associated cerebral blood volume. Decreased blood volume and, matched prolonged mean transit time corresponding to known infarcts within the rostrum of the corpus callosum, RIGHT basal ganglia and LEFT frontal lobe.  CTA:  (delayed bolus timing limits evaluation)  Anterior circulation: Normal symmetric appearance of cervical, petrous, cavernous internal carotid arteries. Diminutive appearance of the A1 segments, with slightly irregular appearance of the A2 segments. Relatively normal appearance of the middle cerebral arteries.  Posterior communication: Relatively normal appearance of the vertebral arteries, vertebrobasilar junction and basilar artery. 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 Normal appearance of the posterior cerebral arteries.  Status post clipping of known anterior communicating  artery aneurysm, no additional aneurysm identified. Status post RIGHT frontotemporal craniotomy. Layering blood products in the occipital horns.  Review of the MIP images confirms the above findings.  IMPRESSION: Prolonged mean transit time in bilateral anterior cerebral artery territories, with corresponding irregular A1 and A2 segments of the anterior cerebral arteries most consistent  with vasospasm.  Matched perfusion defects corresponding to known infarcts.  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   Electronically Signed   By: Awilda Metro   On: 08/03/2014 03:31   Dg Chest Port 1 View  08/03/2014   CLINICAL DATA:  Chronic obstructive pulmonary disease, with respiratory distress.  EXAM: PORTABLE CHEST - 1 VIEW  COMPARISON:  08/02/2014  FINDINGS: Central venous catheter tip has been advanced to the cavoatrial junction. There is no pneumothorax. Endotracheal tube tip is difficult to confirm in relation to the carina due to patient rotation, but estimated 28 mm above carina, similar to priors. Orogastric tube is coiled in the stomach. Normal cardiac size. Patchy bibasilar densities appears stable. Small LEFT effusion. RIGHT effusion more difficult to visualize.  IMPRESSION: Support apparatus in good position.  No pneumothorax.  Patchy bibasilar densities appear roughly stable.   Electronically Signed   By: Davonna Belling M.D.   On: 08/03/2014 07:17    Intake/Output Summary (Last 24 hours) at 08/04/14 1009 Last data filed at 08/04/14 0900  Gross per 24 hour  Intake 5717.6 ml  Output   2415 ml  Net 3302.6 ml   ASSESSMENT / PLAN:  NEUROLOGIC A:   Subarachnoid hemorrhage ACA aneurysm > now s/p surgical clipping Frontal Lobe infarcts on 11/4 CT head P:   Management per Neurosurgery, need more time for prognostication Continue hyperdynamic therapy, pressors to increase SBP to 180-200 per NS recommendations. RASS goal: 0  Fentanyl gtt for analgesia/sedation Nimodipine for spasm Started statin 11/4 Daily WUA  PULMONARY OETT 11/2 >>> A: Acute hypoxemic respiratory failure 2nd to impaired airway protection. COPD, not in exacerbation. Concern for upper airway edema 11/05. Not weanable 11/7 P:   PS trials. Trach tomorrow at 10 AM. F/u CXR and ABG Decadron started 11/05 >> continue, and re-assess cuff leak PRN BD's Will need diureses  once neuro status is more stable to consider weaning.  CARDIOVASCULAR Rt IJ CVL 11/03 >> A:  HHH therapy in setting of SAH P:  CVP monitoring Levophed for SBP goal 180-200 Continue IVF as per NS .45 saline at 120 cc/hr, will need active diureses prior to any serious consideration of weaning.  RENAL  Recent Labs Lab 08/02/14 0540 08/03/14 0555 08/04/14 0430  NA 144 140 141    Recent Labs Lab 08/02/14 0540 08/03/14 0555 08/04/14 0430  K 3.4* 3.6* 3.2*   A:   Mild hypernatremia Hypokalemia hypophosphoremia  P:   F/u BMET Replete electrolytes as indicated On 1/2 ns per ns at 120 cc /hr, lasix d/ced, will hold off diureses until ok with NS. Free H20 11/8 ,100 cc q 8 h  GASTROINTESTINAL A:   Nutrition P:   Tube feeds while on vent SUP: pepcid  HEMATOLOGIC A:   Anemia of critical illness. Leukocytosis. P:  F/u CBC SCD for DVT prevention  INFECTIOUS A:   Sepsis 11/04 ?source P:   Day 10 of Abx, currently on zosyn/vancomycin >> will D/C today  Blood 11/02 >>NTD Blood 11/03 >>NTD Blood 11/04 >>NTD C diff 11/06 >> neg Sputum 11/2>>neg UC 11-2>>neg  ENDOCRINE A:   No acute issues P:   SSI  while on tube feeds  FAMILY  - Updates: No family bedside  TODAY'S SUMMARY:  Subarachnoid hemorrhage, now s/p surgical clipping.  D/C abx today.  Trach in AM.  Consult for PEG placed.  CC time 35 min.  Alyson ReedyWesam G. Yacoub, M.D. [facility]. Pager: [phone]. After hours pager: [phone].

## 2014-08-04 NOTE — Progress Notes (Addendum)
Transcranial Doppler  Date POD PCO2 HCT BP  MCA ACA PCA OPHT SIPH VERT Basilar  07/28/14 MS   29.8 108/46 Right  Left   105  105   -69  -91   66  32   16  10   *  73   -27  -32   *  *    11-6- 15 SB     Right  Left   91  126   -57  -117   52  37/44   32  17   63  88   --  -26     *    08-02-14 VS     Right  Left   53  76   -41  -65   49  59   30  23   27  24    -79  -56   -85      08-04-14 SB      Right  Left   119  119   -72  -69   -50  63   23  20   84  67   -51  --     --          Right  Left                                            Right  Left                                            Right  Left                                        MCA = Middle Cerebral Artery      OPHT = Opthalmic Artery     BASILAR = Basilar Artery   ACA = Anterior Cerebral Artery     SIPH = Carotid Siphon PCA = Posterior Cerebral Artery   VERT = Verterbral Artery                   Normal MCA = 62+\-12 ACA = 50+\-12 PCA = 42+\-23   *Unable to insonate  08/04/2014 3:29 PM Gray[employer] ElectricVirginia Slaughter, RVS

## 2014-08-04 NOTE — Progress Notes (Signed)
UR completed.  Continue to follow pt's progress to plan discharge. She is far outside the parameters for LTACH placement with her insurance company (hospital day 3121, trach >537 days old, 3 failed weans on trach).  Carlyle LipaMichelle Lora Chavers, RN BSN MHA CCM Trauma/Neuro ICU Case Manager [phone]

## 2014-08-04 NOTE — Progress Notes (Signed)
Pt seen and examined. No issues overnight.   EXAM: Temp:  [98.1 F (36.7 C)-99.6 F (37.6 C)] 99.6 F (37.6 C) (11/11 0754) Pulse Rate:  [91-117] 98 (11/11 0819) Resp:  [0-36] 22 (11/11 0819) BP: (142-208)/(45-111) 198/58 mmHg (11/11 0819) SpO2:  [98 %-100 %] 100 % (11/11 0819) FiO2 (%):  [40 %] 40 % (11/11 0819) Weight:  [68.4 kg (150 lb 12.7 oz)] 68.4 kg (150 lb 12.7 oz) (11/11 0414) Intake/Output      11/10 0701 - 11/11 0700 11/11 0701 - 11/12 0700   I.V. (mL/kg) 3406.5 (49.8) 157.5 (2.3)   NG/GT 955 45   IV Piggyback 1550    Total Intake(mL/kg) 5911.5 (86.4) 202.5 (3)   Urine (mL/kg/hr) 2115 (1.3)    Stool 600 (0.4)    Total Output 2715     Net +3196.5 +202.5         Opens eyes to voice ?tracking Follows command RUE. Per RN, did FC RLE No movements of LUE/LLE Wound c/d/i  LABS: Lab Results  Component Value Date   CREATININE 0.65 08/04/2014   BUN 10 08/04/2014   NA 141 08/04/2014   K 3.2* 08/04/2014   CL 106 08/04/2014   CO2 24 08/04/2014   Lab Results  Component Value Date   WBC 14.3* 08/04/2014   HGB 7.0* 08/04/2014   HCT 22.1* 08/04/2014   MCV 86.3 08/04/2014   PLT 330 08/04/2014    IMPRESSION: - 63 y.o. female SAH d# 9 s/p Acom clipping - Slightly improved exam today. 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 Will likely cont hyperdynamic treatments through 14 days before backing off fluids and pressors.

## 2014-08-04 NOTE — Plan of Care (Signed)
Problem: Phase II Progression Outcomes Goal: Neuro exam at baseline or improved Outcome: Progressing Goal: Tolerating diet Outcome: Progressing

## 2014-08-04 NOTE — Consult Note (Signed)
Reason for Consult:PEG tube placement Referring Physician: [name] is an 63 y.o. female.  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  Now needs a PEG for long term care  Past Medical History  Diagnosis Date  . COPD (chronic obstructive pulmonary disease)     Past Surgical History  Procedure Laterality Date  . Craniotomy Right 07/27/2014    Procedure: CRANIOTOMY INTRACRANIAL ANEURYSM FOR Pterional Aneurysm;  Surgeon: Stephanie Lose, MD;  Location: [facility];  Service: Neurosurgery;  Laterality: Right;    No family history on file.  Social History:  has no tobacco, alcohol, and drug history on file.  Allergies: No Known Allergies  Medications: I have reviewed the patient's current medications.  Results for orders placed or performed during the hospital encounter of 07/26/14 (from the past 48 hour(s))  Glucose, capillary     Status: Abnormal   Collection Time: 08/02/14  4:12 PM  Result Value Ref Range   Glucose-Capillary 184 (H) 70 - 99 mg/dL  Glucose, capillary     Status: Abnormal   Collection Time: 08/02/14  7:59 PM  Result Value Ref Range   Glucose-Capillary 215 (H) 70 - 99 mg/dL  Glucose, capillary     Status: Abnormal   Collection Time: 08/02/14 11:27 PM  Result Value Ref Range   Glucose-Capillary 201 (H) 70 - 99 mg/dL  Glucose, capillary     Status: Abnormal   Collection Time: 08/03/14  3:36 AM  Result Value Ref Range   Glucose-Capillary 161 (H) 70 - 99 mg/dL  Blood gas, arterial     Status: Abnormal   Collection Time: 08/03/14  5:00 AM  Result Value Ref Range   FIO2 0.40 %   Delivery systems VENTILATOR    Mode PRESSURE REGULATED VOLUME CONTROL    VT 370 mL   Rate 15 resp/min   Peep/cpap 5.0 cm H20   pH, Arterial 7.423 7.350 - 7.450   pCO2 arterial 32.5 (L) 35.0 - 45.0 mmHg   pO2, Arterial 170.0 (H) 80.0 - 100.0 mmHg   Bicarbonate 20.9 20.0 - 24.0 mEq/L   TCO2 21.9 0 - 100 mmol/L   Acid-base deficit 2.8 (H) 0.0 - 2.0 mmol/L    O2 Saturation 99.2 %   Patient temperature 98.6    Collection site LEFT RADIAL    Drawn by [phone]    Sample type ARTERIAL    Allens test (pass/fail) PASS PASS  Magnesium     Status: None   Collection Time: 08/03/14  5:55 AM  Result Value Ref Range   Magnesium 1.8 1.5 - 2.5 mg/dL  Phosphorus     Status: Abnormal   Collection Time: 08/03/14  5:55 AM  Result Value Ref Range   Phosphorus 2.1 (L) 2.3 - 4.6 mg/dL  CBC     Status: Abnormal   Collection Time: 08/03/14  5:55 AM  Result Value Ref Range   WBC 12.9 (H) 4.0 - 10.5 K/uL   RBC 2.58 (L) 3.87 - 5.11 MIL/uL   Hemoglobin 7.2 (L) 12.0 - 15.0 g/dL   HCT 22.6 (L) 36.0 - 46.0 %   MCV 87.6 78.0 - 100.0 fL   MCH 27.9 26.0 - 34.0 pg   MCHC 31.9 30.0 - 36.0 g/dL   RDW 14.5 11.5 - 15.5 %   Platelets 256 150 - 400 K/uL    Comment: DELTA CHECK NOTED REPEATED TO VERIFY   Basic metabolic panel     Status: Abnormal   Collection Time: 08/03/14  5:55 AM  Result Value Ref Range   Sodium 140 137 - 147 mEq/L   Potassium 3.6 (L) 3.7 - 5.3 mEq/L   Chloride 107 96 - 112 mEq/L   CO2 23 19 - 32 mEq/L   Glucose, Bld 154 (H) 70 - 99 mg/dL   BUN 8 6 - 23 mg/dL   Creatinine, Ser 0.60 0.50 - 1.10 mg/dL   Calcium 8.4 8.4 - 10.5 mg/dL   GFR calc non Af Amer >90 >90 mL/min   GFR calc Af Amer >90 >90 mL/min    Comment: (NOTE) The eGFR has been calculated using the CKD EPI equation. This calculation has not been validated in all clinical situations. eGFR's persistently <90 mL/min signify possible Chronic Kidney Disease.    Anion gap 10 5 - 15  Glucose, capillary     Status: Abnormal   Collection Time: 08/03/14  8:35 AM  Result Value Ref Range   Glucose-Capillary 138 (H) 70 - 99 mg/dL   Comment 1 Notify RN    Comment 2 Documented in Chart   Glucose, capillary     Status: Abnormal   Collection Time: 08/03/14 11:40 AM  Result Value Ref Range   Glucose-Capillary 132 (H) 70 - 99 mg/dL   Comment 1 Notify RN    Comment 2 Documented in Chart    Glucose, capillary     Status: Abnormal   Collection Time: 08/03/14  4:52 PM  Result Value Ref Range   Glucose-Capillary 104 (H) 70 - 99 mg/dL   Comment 1 Notify RN    Comment 2 Documented in Chart   Glucose, capillary     Status: Abnormal   Collection Time: 08/03/14  7:47 PM  Result Value Ref Range   Glucose-Capillary 132 (H) 70 - 99 mg/dL  Glucose, capillary     Status: Abnormal   Collection Time: 08/03/14 11:33 PM  Result Value Ref Range   Glucose-Capillary 152 (H) 70 - 99 mg/dL  Glucose, capillary     Status: Abnormal   Collection Time: 08/04/14  3:39 AM  Result Value Ref Range   Glucose-Capillary 157 (H) 70 - 99 mg/dL  Magnesium     Status: None   Collection Time: 08/04/14  4:30 AM  Result Value Ref Range   Magnesium 1.8 1.5 - 2.5 mg/dL  Phosphorus     Status: Abnormal   Collection Time: 08/04/14  4:30 AM  Result Value Ref Range   Phosphorus 1.8 (L) 2.3 - 4.6 mg/dL  CBC     Status: Abnormal   Collection Time: 08/04/14  4:30 AM  Result Value Ref Range   WBC 14.3 (H) 4.0 - 10.5 K/uL   RBC 2.56 (L) 3.87 - 5.11 MIL/uL   Hemoglobin 7.0 (L) 12.0 - 15.0 g/dL   HCT 22.1 (L) 36.0 - 46.0 %   MCV 86.3 78.0 - 100.0 fL   MCH 27.3 26.0 - 34.0 pg   MCHC 31.7 30.0 - 36.0 g/dL   RDW 15.0 11.5 - 15.5 %   Platelets 330 150 - 400 K/uL  Basic metabolic panel     Status: Abnormal   Collection Time: 08/04/14  4:30 AM  Result Value Ref Range   Sodium 141 137 - 147 mEq/L   Potassium 3.2 (L) 3.7 - 5.3 mEq/L   Chloride 106 96 - 112 mEq/L   CO2 24 19 - 32 mEq/L   Glucose, Bld 181 (H) 70 - 99 mg/dL   BUN 10 6 - 23 mg/dL   Creatinine,  Ser 0.65 0.50 - 1.10 mg/dL   Calcium 8.3 (L) 8.4 - 10.5 mg/dL   GFR calc non Af Amer >90 >90 mL/min   GFR calc Af Amer >90 >90 mL/min    Comment: (NOTE) The eGFR has been calculated using the CKD EPI equation. This calculation has not been validated in all clinical situations. eGFR's persistently <90 mL/min signify possible Chronic Kidney Disease.     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  EXAM: CT ANGIOGRAPHY HEAD  TECHNIQUE: Multidetector CT imaging of the head was performed using the standard protocol during bolus administration of intravenous contrast. Multiplanar CT image reconstructions and MIPs were obtained to evaluate the vascular anatomy. Axial perfusion imaging performed with post processing.  CONTRAST:  41m OMNIPAQUE IOHEXOL 350 MG/ML SOLN  COMPARISON:  CT of the head August 02, 2014 and CT angiogram of the head July 26, 2014  FINDINGS: CT perfusion: Relatively symmetric prolonged mean transit time in the bilateral anterior cerebral artery vascular territories, along the centrum semiovale. There is relatively preserved associated cerebral blood volume. Decreased blood volume and, matched prolonged mean transit time corresponding to known infarcts within the rostrum of the corpus callosum, RIGHT basal ganglia and LEFT frontal lobe.  CTA:  (delayed bolus timing limits evaluation)  Anterior circulation: Normal symmetric appearance of cervical, petrous, cavernous internal carotid arteries. Diminutive appearance of the A1 segments, with slightly irregular appearance of the A2 segments. Relatively normal appearance of the middle cerebral arteries.  Posterior communication: Relatively normal appearance of the vertebral arteries, vertebrobasilar junction and basilar artery.  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 Normal appearance of the posterior cerebral arteries.  Status post clipping of known anterior communicating artery aneurysm, no additional aneurysm identified. Status post RIGHT frontotemporal craniotomy. Layering blood products in the occipital horns.  Review of the MIP images confirms the above findings.  IMPRESSION: Prolonged mean transit time in bilateral anterior cerebral artery territories, with corresponding irregular A1 and A2 segments of the anterior cerebral arteries most consistent with vasospasm.  Matched perfusion defects corresponding to known infarcts.  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   Electronically Signed   By: CElon Alas  On: 08/03/2014 03:31   Ct Head Wo Contrast  08/02/2014   CLINICAL DATA:  Aneurysm clipping. Evaluate for vasospasm of cerebral artery.  EXAM: CT HEAD WITHOUT CONTRAST  TECHNIQUE: Contiguous axial images were obtained from the base of the skull through the vertex without intravenous contrast.  COMPARISON:  CT 08/01/2014  FINDINGS: CTA of the head and CT perfusion was attempted however there was extravasation of contrast at the IV site and no contrast was administered intravascularly. Precontrast CT the head was performed. Patient will return for CTA once adequate IV access is obtained.  Intraventricular hemorrhage in the left occipital horn is unchanged. Mild amount of interhemispheric subarachnoid hemorrhage also unchanged.  Aneurysm clips in the region of the anterior communicating artery. Low density is present in the right anterior basal ganglia and anterior cingulate gyrus unchanged. There is low-density edema/ infarction in the inferior frontal lobes bilaterally, unchanged. No new area of infarction.  Negative for shift of the midline structures. Negative for hydrocephalus.  IMPRESSION: CTA head and CT perfusion not obtained due to  extravasation of contrast.  Unenhanced head CT unchanged from yesterday with intracranial hemorrhage and areas of cerebral infarction.   Electronically Signed   By: Franchot Gallo M.D.   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  EXAM: CT ANGIOGRAPHY HEAD  TECHNIQUE: Multidetector CT imaging of the head was performed using the standard protocol during bolus administration of intravenous contrast. Multiplanar CT image reconstructions and MIPs were obtained to evaluate the vascular anatomy. Axial perfusion imaging performed with post processing.  CONTRAST:  81m OMNIPAQUE IOHEXOL 350 MG/ML SOLN  COMPARISON:  CT of the head August 02, 2014 and CT angiogram of the head July 26, 2014  FINDINGS: CT perfusion: Relatively symmetric prolonged mean transit time in the bilateral anterior cerebral artery vascular territories, along the centrum semiovale. There is relatively preserved associated cerebral blood volume. Decreased blood volume and, matched prolonged mean transit time corresponding to known infarcts within the rostrum of the corpus callosum, RIGHT basal ganglia and LEFT frontal lobe.  CTA:  (delayed bolus timing limits evaluation)  Anterior circulation: Normal symmetric appearance of cervical, petrous, cavernous internal carotid arteries. Diminutive appearance of the A1 segments, with slightly irregular appearance of the A2 segments. Relatively normal appearance of the middle cerebral arteries.  Posterior communication: Relatively normal appearance of the vertebral arteries, vertebrobasilar junction and basilar artery. 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 Normal appearance of the posterior cerebral arteries.  Status post clipping of known anterior communicating artery aneurysm, no additional aneurysm  identified. Status post RIGHT frontotemporal craniotomy. Layering blood products in the occipital horns.  Review of the MIP images confirms the above findings.  IMPRESSION: Prolonged mean transit time in bilateral anterior cerebral artery territories, with corresponding irregular A1 and A2 segments of the anterior cerebral arteries most consistent with vasospasm.  Matched perfusion defects corresponding to known infarcts.  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   Electronically Signed   By: CElon Alas  On: 08/03/2014 03:31   Dg Chest Port 1 View  08/03/2014   CLINICAL DATA:  Chronic obstructive pulmonary disease, with respiratory distress.  EXAM: PORTABLE CHEST - 1 VIEW  COMPARISON:  08/02/2014  FINDINGS: Central venous catheter tip has been advanced to the cavoatrial junction. There is no pneumothorax. Endotracheal tube tip is difficult to confirm in relation to the carina due to patient rotation, but estimated 28 mm above carina, similar to priors. Orogastric tube is coiled in the stomach. Normal cardiac size. Patchy bibasilar densities appears stable. Small LEFT effusion. RIGHT effusion more difficult to visualize.  IMPRESSION: Support apparatus in good position.  No pneumothorax.  Patchy bibasilar densities appear roughly stable.   Electronically Signed   By: JRolla FlattenM.D.   On: 08/03/2014 07:17    Review of Systems  Unable to perform ROS: intubated   Blood pressure 167/59, pulse 124, temperature 99.9  F (37.7 C), temperature source Axillary, resp. rate 28, height 5' (1.524 m), weight 68.4 kg (150 lb 12.7 oz), SpO2 97 %. Physical Exam  Vitals reviewed. Cardiovascular: Regular rhythm.  Tachycardia present.   GI: Soft. Bowel sounds are decreased. There is tenderness. There is guarding. There is no rebound.  Edematous abdominal wall  Neurological: She is unresponsive. GCS eye subscore is 3. GCS verbal subscore is 3. GCS motor subscore is 6.     Assessment/Plan: Plan for PEG about 1:00 pm tomorrow at the bedside. Dr. Grandville Silos aware.  Stephanie Doyle, JAY 08/04/2014, 1:07 PM

## 2014-08-04 NOTE — Progress Notes (Signed)
PT Cancellation and Discharge Note  Patient Details Name: Shelton SilvasVicky D Bolon MRN: [id number] DOB: 03/28/1951   Cancelled Treatment:    Reason Eval/Treat Not Completed: Patient not medically ready.  Noted Neurosurg continued plan for continuing hyperdynamic therapy.  Will hold PT and sign off at this time.  Will need new order when pt becomes appropriate for mobility.     Mahdi Frye, Alison MurrayMegan F 08/04/2014, 7:00 AM

## 2014-08-05 ENCOUNTER — Inpatient Hospital Stay (HOSPITAL_COMMUNITY): Payer: 59

## 2014-08-05 ENCOUNTER — Encounter (HOSPITAL_COMMUNITY)
Admission: AD | Disposition: A | Discharge status: Other Acute Inpatient Hospital | Payer: Self-pay | Source: Other Acute Inpatient Hospital | Attending: Neurosurgery

## 2014-08-05 HISTORY — PX: ESOPHAGOGASTRODUODENOSCOPY (EGD) WITH PROPOFOL: SHX5813

## 2014-08-05 HISTORY — PX: PEG PLACEMENT: SHX5437

## 2014-08-05 LAB — BASIC METABOLIC PANEL
Anion gap: 12 (ref 5–15)
BUN: 14 mg/dL (ref 6–23)
CO2: 23 meq/L (ref 19–32)
Calcium: 8.6 mg/dL (ref 8.4–10.5)
Chloride: 105 mEq/L (ref 96–112)
Creatinine, Ser: 0.73 mg/dL (ref 0.50–1.10)
GFR calc Af Amer: >90 mL/min (ref >90)
GFR calc non Af Amer: 88 mL/min — ABNORMAL LOW (ref >90)
GLUCOSE: 138 mg/dL — AB (ref 70–99)
POTASSIUM: 4 meq/L (ref 3.7–5.3)
Sodium: 140 mEq/L (ref 137–147)

## 2014-08-05 LAB — BLOOD GAS, ARTERIAL
ACID-BASE DEFICIT: 3 mmol/L — AB (ref 0.0–2.0)
Bicarbonate: 21.2 mEq/L (ref 20.0–24.0)
Drawn by: 39898
FIO2: 0.4 %
LHR: 15 {breaths}/min
O2 SAT: 99.4 %
PEEP: 5 cmH2O
PO2 ART: 164 mmHg — AB (ref 80.0–100.0)
Patient temperature: 98.6
TCO2: 22.3 mmol/L (ref 0–100)
VT: 370 mL
pCO2 arterial: 36 mmHg (ref 35.0–45.0)
pH, Arterial: 7.388 (ref 7.350–7.450)

## 2014-08-05 LAB — MAGNESIUM: Magnesium: 2 mg/dL (ref 1.5–2.5)

## 2014-08-05 LAB — CBC
HEMATOCRIT: 23.2 % — AB (ref 36.0–46.0)
Hemoglobin: 7.2 g/dL — ABNORMAL LOW (ref 12.0–15.0)
MCH: 27 pg (ref 26.0–34.0)
MCHC: 31 g/dL (ref 30.0–36.0)
MCV: 86.9 fL (ref 78.0–100.0)
Platelets: 389 10*3/uL (ref 150–400)
RBC: 2.67 MIL/uL — AB (ref 3.87–5.11)
RDW: 15.2 % (ref 11.5–15.5)
WBC: 20.6 10*3/uL — ABNORMAL HIGH (ref 4.0–10.5)

## 2014-08-05 LAB — PROTIME-INR
INR: 1.27 (ref 0.00–1.49)
Prothrombin Time: 16.1 seconds — ABNORMAL HIGH (ref 11.6–15.2)

## 2014-08-05 LAB — GLUCOSE, CAPILLARY
GLUCOSE-CAPILLARY: 101 mg/dL — AB (ref 70–99)
GLUCOSE-CAPILLARY: 115 mg/dL — AB (ref 70–99)
GLUCOSE-CAPILLARY: 123 mg/dL — AB (ref 70–99)
GLUCOSE-CAPILLARY: 135 mg/dL — AB (ref 70–99)
GLUCOSE-CAPILLARY: 150 mg/dL — AB (ref 70–99)
Glucose-Capillary: 128 mg/dL — ABNORMAL HIGH (ref 70–99)

## 2014-08-05 LAB — APTT: APTT: 31 s (ref 24–37)

## 2014-08-05 LAB — PHOSPHORUS: Phosphorus: 2.3 mg/dL (ref 2.3–4.6)

## 2014-08-05 SURGERY — ESOPHAGOGASTRODUODENOSCOPY (EGD) WITH PROPOFOL
Anesthesia: Moderate Sedation

## 2014-08-05 MED ORDER — SODIUM CHLORIDE 0.9 % IV SOLN
0.0000 ug/min | INTRAVENOUS | Status: DC
  Administered 2014-08-06: 20 ug/min via INTRAVENOUS
  Administered 2014-08-06: 24 ug/min via INTRAVENOUS
  Administered 2014-08-06: 30 ug/min via INTRAVENOUS
  Administered 2014-08-07: 32 ug/min via INTRAVENOUS
  Administered 2014-08-07: 40 ug/min via INTRAVENOUS
  Administered 2014-08-08 – 2014-08-09 (×2): 20 ug/min via INTRAVENOUS
  Filled 2014-08-05 (×9): qty 16

## 2014-08-05 MED ORDER — VECURONIUM BROMIDE 10 MG IV SOLR
INTRAVENOUS | Status: AC
  Filled 2014-08-05: qty 10

## 2014-08-05 MED ORDER — VECURONIUM BROMIDE 10 MG IV SOLR
10.0000 mg | Freq: Once | INTRAVENOUS | Status: AC
  Administered 2014-08-05: 10 mg via INTRAVENOUS
  Filled 2014-08-05: qty 10

## 2014-08-05 MED ORDER — SODIUM CHLORIDE 0.9 % IV SOLN
INTRAVENOUS | Status: DC
  Administered 2014-08-05 – 2014-08-16 (×2): via INTRAVENOUS

## 2014-08-05 SURGICAL SUPPLY — 1 items: Boston Scientific (Peg) ×3 IMPLANT

## 2014-08-05 NOTE — Plan of Care (Signed)
Problem: Phase I Progression Outcomes Goal: Pain controlled with appropriate interventions Outcome: Progressing     

## 2014-08-05 NOTE — Procedures (Signed)
Bronchoscopy Procedure Note Stephanie SilvasVicky D Doyle [id number] [birth year]  Procedure: Bronchoscopy Indications: Diagnostic evaluation of the airways  Procedure Details Consent: Risks of procedure as well as the alternatives and risks of each were explained to the (patient/caregiver).  Consent for procedure obtained. Time Out: Verified patient identification, verified procedure, site/side was marked, verified correct patient position, special equipment/implants available, medications/allergies/relevent history reviewed, required imaging and test results available.  Performed  In preparation for procedure, patient was given 100% FiO2 and bronchoscope lubricated. Sedation: Etomidate  Airway entered and the following bronchi were examined: Bronchi.   Procedures performed: Brushings performed - none Bronch placed, backed up ett direct to 17 cm, noted needle then white cath sheeth, wire placed into a groove post wall, Dr Molli KnockYacoub pulled back catheter from groove, no apparent perf noted, then observed puch dil, progressive dil then 6 shiley placed. Bronch through trach wnl, no bleeding or perf   Bronchoscope removed.  , Patient placed back on 100% FiO2 at conclusion of procedure.    Evaluation Hemodynamic Status: BP stable throughout; O2 sats: stable throughout Patient's Current Condition: stable Specimens:  None Complications: No apparent complications Patient did tolerate procedure well.   Nelda BucksFEINSTEIN,DANIEL J. 08/05/2014

## 2014-08-05 NOTE — Progress Notes (Signed)
PULMONARY / CRITICAL CARE MEDICINE   Name: Stephanie Doyle MRN: [id number] DOB: 1950-12-23    ADMISSION DATE:  07/26/2014 CONSULTATION DATE:  07/26/2014  REFERRING MD :  Dr. Franky Machoabbell  CHIEF COMPLAINT:  Headache  INITIAL PRESENTATION: 63 year old female with no known medical history [facility] with severe headache, found to have SAH. AMS in ED requiring intubation. [facility]. PCCM consulted for vent and ICU management.  STUDIES:  11/2 CT head > diffuse SAH, frontal and temporal bilaterally.  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; no aneurysm in intracranial circulation; stable SAH 11/4 CT head >> decrease in size of SAH, developing hypodensity within medial aspiect of inferior frontal lobes consistent with evolving infarcts 11/4 Echo> LVEF 60-65%, wall motion normal, Grade 1 diastolic dysfunction  SIGNIFICANT EVENTS: 11/2[facility]- SAH, Intubated for airway protection, [facility]- Right pterional craniotomy for clipping of anterior communicating artery aneurysm, complex 11/5 - no cuff leak >> started steroids 11/7 not weaning , lowest ps was 12  SUBJECTIVE:   NSC PS 12 currently  VITAL SIGNS: Temp:  [98.2 F (36.8 C)-99.9 F (37.7 C)] 98.2 F (36.8 C) (11/12 0845) Pulse Rate:  [92-139] 92 (11/12 1048) Resp:  [13-35] 20 (11/12 1048) BP: (125-224)/(45-86) 149/68 mmHg (11/12 1048) SpO2:  [95 %-100 %] 100 % (11/12 1048) FiO2 (%):  [40 %] 40 % (11/12 0930) Weight:  [70.1 kg (154 lb 8.7 oz)] 70.1 kg (154 lb 8.7 oz) (11/12 0309)   HEMODYNAMICS:    VENTILATOR SETTINGS: Vent Mode:  [-] PSV;CPAP FiO2 (%):  [40 %] 40 % Set Rate:  [15 bmp] 15 bmp Vt Set:  [370 mL] 370 mL PEEP:  [5 cmH20] 5 cmH20 Pressure Support:  [12 cmH20] 12 cmH20 Plateau Pressure:  [29 cmH20-39 cmH20] 29 cmH20  INTAKE / OUTPUT:  Intake/Output Summary (Last 24 hours) at 08/05/14 1100 Last data filed at 08/05/14 1035  Gross per 24 hour  Intake 5891.74  ml  Output   2700 ml  Net 3191.74 ml    PHYSICAL EXAMINATION: General: no distress, sedated on fentanyl HEENT: Rt frontal suture line intact bruising R eye, ett PULM: CTA B CV RRR no mgr Ab: soft, mild distention, gurgling bowel sounds Ext: warm, ++ edema Neuro: RASS -1,No  follows commands, opens eyes to painful stimuli but not command.  LABS:  CBC  Recent Labs Lab 08/03/14 0555 08/04/14 0430 08/05/14 0512  WBC 12.9* 14.3* 20.6*  HGB 7.2* 7.0* 7.2*  HCT 22.6* 22.1* 23.2*  PLT 256 330 389   Coag's  Recent Labs Lab 08/05/14 0512  APTT 31  INR 1.27   BMET  Recent Labs Lab 08/03/14 0555 08/04/14 0430 08/05/14 0512  NA 140 141 140  K 3.6* 3.2* 4.0  CL 107 106 105  CO2 23 24 23   BUN 8 10 14   CREATININE 0.60 0.65 0.73  GLUCOSE 154* 181* 138*   Electrolytes  Recent Labs Lab 08/03/14 0555 08/04/14 0430 08/05/14 0512  CALCIUM 8.4 8.3* 8.6  MG 1.8 1.8 2.0  PHOS 2.1* 1.8* 2.3   Sepsis Markers No results for input(s): LATICACIDVEN, PROCALCITON, O2SATVEN in the last 168 hours.   ABG  Recent Labs Lab 08/03/14 0500 08/05/14 0500  PHART 7.423 7.388  PCO2ART 32.5* 36.0  PO2ART 170.0* 164.0*   Cardiac Enzymes  Recent Labs Lab 08/01/14 0447  PROBNP 683.6*   Glucose  Recent Labs Lab 08/04/14 1156 08/04/14 1551 08/04/14 2014  08/04/14 2341 08/05/14 0432 08/05/14 0800  GLUCAP 136* 166* 165* 150* 135* 128*    Imaging No results found.  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 RASS goal: 0  Fentanyl gtt for  analgesia/sedation Nimodipine for spasm Started statin 11/4 Daily WUA  PULMONARY OETT 11/2 >>> A: Acute hypoxemic respiratory failure 2nd to impaired airway protection. COPD, not in exacerbation. Concern for upper airway edema 11/05. Not weanable 11/7 P:   Proceed with tracheostomy today. Proceed with PS trials post extubation. F/u CXR and ABG in AM. PRN BD's Will need diureses once neuro status is more stable to consider weaning.  CARDIOVASCULAR Rt IJ CVL 11/03 >> A:  HHH therapy in setting of SAH P:  CVP monitoring Levophed for SBP goal 180-200 Continue IVF as per NS .45 saline at 120 cc/hr, will need active diureses prior to any serious consideration of weaning.  RENAL  Recent Labs Lab 08/03/14 0555 08/04/14 0430 08/05/14 0512  NA 140 141 140   Recent Labs Lab 08/03/14 0555 08/04/14 0430 08/05/14 0512  K 3.6* 3.2* 4.0   A:   Mild hypernatremia Hypokalemia  P:   F/u BMET Replete electrolytes as indicated On 1/2 ns per ns at 120 cc /hr, lasix d/ced, will hold off diureses until ok with NS. Free H20 11/8 ,100 cc q 8 h  GASTROINTESTINAL A:   Nutrition P:   Tube feeds while on vent SUP: pepcid  HEMATOLOGIC A:   Anemia of critical illness. Leukocytosis. P:  F/u CBC SCD for DVT prevention  INFECTIOUS A:   Sepsis 11/04 ?source P:   Day 10 of Abx, currently on zosyn/vancomycin >> will D/C today  Blood 11/02 >>NTD Blood 11/03 >>NTD Blood 11/04 >>NTD C diff 11/06 >> neg Sputum 11/2>>neg UC 11-2>>neg  ENDOCRINE A:   No acute issues P:   SSI while on tube feeds  FAMILY  - Updates: No family bedside  TODAY'S SUMMARY:  Subarachnoid hemorrhage, now s/p surgical clipping.  Trach today, PEG later today.  Proceed with placement after hyperdynamic therapy is complete.  CC time 35 min.  Alyson ReedyWesam G. Genine Beckett, M.D. [facility]. Pager: [phone]. After hours pager: [phone].

## 2014-08-05 NOTE — Progress Notes (Signed)
Patient ID: Stephanie SilvasVicky D Lenn, female   DOB: [birth year], 63 y.o.   MRN: [id number] Patient examined. Abdominal wall is edematous. Reviewed with Dr. Lindie SpruceWyatt. For PEG today at the bedside. Consent was obtained yesterday by our team. [name] Constanza Mincy, MD, MPH, FACS Trauma: [phone] General Surgery: [phone]

## 2014-08-05 NOTE — Progress Notes (Signed)
Pt seen and examined. Trach and PEG completed without immediate complication.  EXAM: Temp:  [97.7 F (36.5 C)-98.7 F (37.1 C)] 97.7 F (36.5 C) (11/12 1213) Pulse Rate:  [92-139] 123 (11/12 1815) Resp:  [13-31] 20 (11/12 1815) BP: (125-211)/(45-129) 170/73 mmHg (11/12 1815) SpO2:  [97 %-100 %] 100 % (11/12 1815) FiO2 (%):  [40 %-100 %] 40 % (11/12 1800) Weight:  [70.1 kg (154 lb 8.7 oz)] 70.1 kg (154 lb 8.7 oz) (11/12 0309) Intake/Output      11/11 0701 - 11/12 0700 11/12 0701 - 11/13 0700   I.V. (mL/kg) 4027.2 (57.5) 2215.9 (31.6)   Other  80   NG/GT 895 50   IV Piggyback 1200 600   Total Intake(mL/kg) 6122.2 (87.3) 2945.9 (42)   Urine (mL/kg/hr) 1785 (1.1) 665 (0.8)   Drains  50 (0.1)   Stool 900 (0.5) 50 (0.1)   Total Output 2685 765   Net +3437.2 +2180.9         Opens eyes to voice Moves RUE spontaneously, ?FC RUE W/D RLE No significant movent LUE/LLE  LABS: Lab Results  Component Value Date   CREATININE 0.73 08/05/2014   BUN 14 08/05/2014   NA 140 08/05/2014   K 4.0 08/05/2014   CL 105 08/05/2014   CO2 23 08/05/2014   Lab Results  Component Value Date   WBC 20.6* 08/05/2014   HGB 7.2* 08/05/2014   HCT 23.2* 08/05/2014   MCV 86.9 08/05/2014   PLT 389 08/05/2014    IMPRESSION: - 63 y.o. female with possibly worsening vasospasm, although exam appears somewhat stable  PLAN: - Cont to monitor exam. - Will re-eval and consider angiogram possible angioplasty in am.

## 2014-08-05 NOTE — Op Note (Signed)
08/05/14  3:05 PM  PATIENT:  Stephanie Doyle  63 y.o. female  PRE-OPERATIVE DIAGNOSIS:  Aneurysm, need for enteral access  POST-OPERATIVE DIAGNOSIS:  Aneurysm, need for enteral access   PROCEDURE:  Procedure(s): Esophagogastroduodenoscopy Percutaneous endoscopic gastrostomy tube placement  SURGEON:  [name] Kleigh Hoelzer, MD  ASSISTANTS: Charma IgoMichael Jeffery, PAC, Corky CraftsBrittany Pjetraj   ANESTHESIA:   local and IV sedation  EBL:  Total I/O In: 2010 [I.V.:1610; NG/GT:50; IV Piggyback:350] Out: 605 [Urine:605]  BLOOD ADMINISTERED:none  DRAINS: none   SPECIMEN:  No Specimen  DISPOSITION OF SPECIMEN:  N/A  COUNTS:  YES  DICTATION: .Dragon Dictation we are proceeding with EGD and PEG tube placement for enteral access after hemorrhagic stroke. Informed consent was obtained from her family. She underwent tracheostomy earlier today by the pulmonary service. She is in the neurotrauma ICU and remains monitored throughout. We did a timeout procedure. She received intravenous muscle relaxation and sedation. Scope was inserted via the mouth and into the esophagus. There were no gross lesions. Stomach was entered and insufflated. There were mild erosions from the orogastric tube and no other abnormalities. The first portion of the duodenum was entered and visualized. No ulcers or obstructions were seen. The scope was withdrawn into the stomach and the stomach was further insufflated. Which achieved an excellent poke site on the anterior abdominal wall. There was good transillumination.The abdomen was prepped and draped in sterile fashion. Local anesthetic was injected. Small incision was made and then the Angiocath was placed under direct vision into the stomach. Guidewire was passed and grasped with the endoscopic snare. Guidewire was brought out through the mouth and the PEG tube was attached. PEG tube was brought up through the abdominal wall. Scope was reinserted into the stomach. Position of the tube was  confirmed and a picture was taken. Antibioticointment was placed at the exit site. Flange was adjusted on the PEG tube to appropriate placement. Stomach was evacuated. There were no apparent complications. She remained in the neurotrauma intensive care unit.  PATIENT DISPOSITION:  ICU - intubated and hemodynamically stable.   Delay start of Pharmacological VTE agent (>24hrs) due to surgical blood loss or risk of bleeding:  not applicable  [name] Hillary Schwegler, MD, MPH, FACS Pager: [phone]  11/12/20153:05 PM

## 2014-08-05 NOTE — Progress Notes (Signed)
Pt seen and examined. No issues overnight.   EXAM: Temp:  [98.2 F (36.8 C)-99.8 F (37.7 C)] 98.2 F (36.8 C) (11/12 0845) Pulse Rate:  [92-139] 104 (11/12 1130) Resp:  [13-31] 19 (11/12 1130) BP: (125-211)/(45-86) 187/72 mmHg (11/12 1130) SpO2:  [97 %-100 %] 100 % (11/12 1130) FiO2 (%):  [40 %] 40 % (11/12 1115) Weight:  [70.1 kg (154 lb 8.7 oz)] 70.1 kg (154 lb 8.7 oz) (11/12 0309) Intake/Output      11/11 0701 - 11/12 0700 11/12 0701 - 11/13 0700   I.V. (mL/kg) 4027.2 (57.5) 640.5 (9.1)   NG/GT 895 50   IV Piggyback 1200 350   Total Intake(mL/kg) 6122.2 (87.3) 1040.5 (14.8)   Urine (mL/kg/hr) 1785 (1.1) 330 (0.9)   Stool 900 (0.5)    Total Output 2685 330   Net +3437.2 +710.5         Opens eyes to pain W/D RUE/RLE. Not reliably following commands on right or left No significant movement of Left side Wound c/d/i  LABS: Lab Results  Component Value Date   CREATININE 0.73 08/05/2014   BUN 14 08/05/2014   NA 140 08/05/2014   K 4.0 08/05/2014   CL 105 08/05/2014   CO2 23 08/05/2014   Lab Results  Component Value Date   WBC 20.6* 08/05/2014   HGB 7.2* 08/05/2014   HCT 23.2* 08/05/2014   MCV 86.9 08/05/2014   PLT 389 08/05/2014    IMAGING: TCD velocities reviewed. Increased bilateral MCA/right ACA velocities  IMPRESSION: - 63 y.o. female SAH d# 10 s/p Acom clipping - Decreased exam today, with TCD velocities increasing - ?worsening spasm - VDRF  PLAN: - Trach/PEG today - Cont hyperdynamic therapy with IVF/colloid and Levophed. If after Trach/PEG and sedation worn off exam is unchanged or worsened, may consider angiogram/angioplasty if needed

## 2014-08-05 NOTE — Procedures (Signed)
Percutaneous Tracheostomy Placement  Consent from sister, patient sedated, paralyzed and positioned, area cleaned, lidocaine/epi injected, skin incision done followed by blunt dissection, airway palpated and entered, wire placed and visualized bronchoscopically, there is a previously occuring false track in the posterior aspect of her trachea where the wire engaged, catheter was pulled out of the track and wire readjusted, airway crushed and dilated then size 6 cuffed shiley was placed and visualized bronchoscopically well above carina.  Good volume returns.  CXR ordered.  Patient tolerated the procedure well with minimal blood loss.  Alyson ReedyWesam G. Yacoub, M.D. [facility]. Pager: [phone]. After hours pager: [phone].

## 2014-08-05 NOTE — Procedures (Signed)
Bedside Tracheostomy Insertion Procedure Note   Patient Details:   Name: Stephanie SilvasVicky D Dart DOB: Jan 16, 1951 MRN: [id number]  Procedure: Tracheostomy  Pre Procedure Assessment: ET Tube Size:7.5 ET Tube secured at lip (cm):23 Bite block in place: No Breath Sounds: Rhonch  Post Procedure Assessment: BP 196/76 mmHg  Pulse 113  Temp(Src) 97.7 F (36.5 C) (Axillary)  Resp 20  Ht 5' (1.524 m)  Wt 154 lb 8.7 oz (70.1 kg)  BMI 30.18 kg/m2  SpO2 100% O2 sats: stable throughout Complications: No apparent complications Patient did tolerate procedure well Tracheostomy Brand:Shiley Tracheostomy Style:Cuffed Tracheostomy Size: 6 Tracheostomy Secured LKG:MWNUUVOvia:Sutures Tracheostomy Placement Confirmation:Trach cuff visualized and in place and Chest X ray ordered for placement    Cherylin Mylaroyle, Manvir Thorson 08/05/2014, 1:31 PM

## 2014-08-05 NOTE — Trach Care Team (Signed)
Trach Care Progression Note   Patient Details Name: Stephanie Doyle MRN: [id number] DOB: 04-05-1951 Today's Date: 08/05/2014   Tracheostomy Assessment    Tracheostomy Shiley 6 mm Cuffed (Active)  Status Secured 08/05/2014 11:15 AM  Site Assessment Clean;Dry 08/05/2014 11:15 AM  Ties Assessment Clean;Dry;Secure 08/05/2014 11:15 AM  Emergency Equipment at bedside Yes 08/05/2014 11:15 AM     Care Needs     Respiratory Therapy O2 Device: Ventilator FiO2 (%): 40 % SpO2: 100 %    Speech Language Pathology  SLP chart review complete: Patient not ready for SLP services (trach placed today, on PS trials. PMSV when tol ATC)   Physical Therapy      Occupational Therapy      Nutritional Patient's Current Diet: NPO Tube Feeding: Vital High Protein Tube Feeding Frequency: Continuous Tube Feeding Strength: Full strength    Case Management/Social Work  LTACH  - will need 21 days in hospital and & 7 days trach for placement    Provider Trach Care Team/Provider Recommendations Trach Care Team Members Present -  Harlon DittyBonnie DeBlois, SLP,  Gretta CoolZackary Brooks, SW Anders SimmondsPete Babcock, ACNP PS trials post trach today; SLP questions whether candidate for inline valve          Alivea Gladson, Silva BandyDebra Anita (scribe for team) 08/05/2014, 2:28 PM

## 2014-08-06 ENCOUNTER — Inpatient Hospital Stay (HOSPITAL_COMMUNITY): Payer: 59

## 2014-08-06 ENCOUNTER — Encounter (HOSPITAL_COMMUNITY): Payer: Self-pay | Admitting: General Surgery

## 2014-08-06 DIAGNOSIS — Z930 Tracheostomy status: Secondary | ICD-10-CM

## 2014-08-06 LAB — CBC
HCT: 23 % — ABNORMAL LOW (ref 36.0–46.0)
HEMOGLOBIN: 7.3 g/dL — AB (ref 12.0–15.0)
MCH: 27.7 pg (ref 26.0–34.0)
MCHC: 31.7 g/dL (ref 30.0–36.0)
MCV: 87.1 fL (ref 78.0–100.0)
PLATELETS: 388 10*3/uL (ref 150–400)
RBC: 2.64 MIL/uL — AB (ref 3.87–5.11)
RDW: 15.4 % (ref 11.5–15.5)
WBC: 22.5 10*3/uL — AB (ref 4.0–10.5)

## 2014-08-06 LAB — MAGNESIUM: Magnesium: 1.9 mg/dL (ref 1.5–2.5)

## 2014-08-06 LAB — BASIC METABOLIC PANEL
ANION GAP: 15 (ref 5–15)
BUN: 16 mg/dL (ref 6–23)
CHLORIDE: 103 meq/L (ref 96–112)
CO2: 19 meq/L (ref 19–32)
CREATININE: 1.17 mg/dL — AB (ref 0.50–1.10)
Calcium: 8.9 mg/dL (ref 8.4–10.5)
GFR calc Af Amer: 55 mL/min — ABNORMAL LOW (ref >90)
GFR calc non Af Amer: 48 mL/min — ABNORMAL LOW (ref >90)
Glucose, Bld: 149 mg/dL — ABNORMAL HIGH (ref 70–99)
Potassium: 3.9 mEq/L (ref 3.7–5.3)
Sodium: 137 mEq/L (ref 137–147)

## 2014-08-06 LAB — POCT I-STAT 3, ART BLOOD GAS (G3+)
Acid-base deficit: 5 mmol/L — ABNORMAL HIGH (ref 0.0–2.0)
BICARBONATE: 22 meq/L (ref 20.0–24.0)
O2 Saturation: 98 %
TCO2: 23 mmol/L (ref 0–100)
pCO2 arterial: 48.5 mmHg — ABNORMAL HIGH (ref 35.0–45.0)
pH, Arterial: 7.267 — ABNORMAL LOW (ref 7.350–7.450)
pO2, Arterial: 114 mmHg — ABNORMAL HIGH (ref 80.0–100.0)

## 2014-08-06 LAB — GLUCOSE, CAPILLARY
GLUCOSE-CAPILLARY: 165 mg/dL — AB (ref 70–99)
Glucose-Capillary: 109 mg/dL — ABNORMAL HIGH (ref 70–99)
Glucose-Capillary: 132 mg/dL — ABNORMAL HIGH (ref 70–99)
Glucose-Capillary: 133 mg/dL — ABNORMAL HIGH (ref 70–99)
Glucose-Capillary: 136 mg/dL — ABNORMAL HIGH (ref 70–99)
Glucose-Capillary: 98 mg/dL (ref 70–99)

## 2014-08-06 LAB — PHOSPHORUS: Phosphorus: 3.2 mg/dL (ref 2.3–4.6)

## 2014-08-06 NOTE — Progress Notes (Signed)
Okay to use PEG for tube feedings and medications.  Call us if there are problems.  Marta LamasJames O. Gae BonWyatt, III, MD, FACS [phone] Trauma Surgeon

## 2014-08-06 NOTE — Progress Notes (Signed)
SLP Cancellation Note  Patient Details Name: Shelton SilvasVicky D Lormand MRN: [id number] DOB: 02-Aug-1951   Cancelled treatment:       Reason Eval/Treat Not Completed: Patient at procedure or test/unavailable. Pt going to CT this am. RN reports pt moving RUE, not following commands, is weaning. SLP will follow for readiness for assessment.    Kendrick Haapala, [name] 08/06/2014, 11:57 AM

## 2014-08-06 NOTE — Progress Notes (Signed)
PULMONARY / CRITICAL CARE MEDICINE   Name: Stephanie Doyle MRN: [id number] DOB: Sep 08, 1951    ADMISSION DATE:  07/26/2014 CONSULTATION DATE:  07/26/2014  REFERRING MD :  Dr. Franky Macho  CHIEF COMPLAINT:  Headache  INITIAL PRESENTATION: 63 year old female with no known medical history presented to [facility] 11/2 with severe headache, found to have SAH. AMS in ED requiring intubation. She was transferred to [facility] for further eval. PCCM consulted for vent and ICU management.  STUDIES:  11/2 CT head > diffuse SAH, frontal and temporal bilaterally.  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, complex 11/5 - no cuff leak >> started steroids 11/7 not weaning , lowest ps was 12  SUBJECTIVE:   Trach complete, patient appears agonal on 12/5  VITAL SIGNS: Temp:  [97.7 F (36.5 C)-99.3 F (37.4 C)] 99.3 F (37.4 C) (11/13 0730) Pulse Rate:  [92-126] 121 (11/13 0900) Resp:  [13-25] 22 (11/13 0900) BP: (130-206)/(46-129) 172/72 mmHg (11/13 0900) SpO2:  [98 %-100 %] 99 % (11/13 0900) FiO2 (%):  [40 %-100 %] 40 % (11/13 0800) Weight:  [72.5 kg (159 lb 13.3 oz)] 72.5 kg (159 lb 13.3 oz) (11/13 0600)   HEMODYNAMICS: CVP:  [13 mmHg-23 mmHg] 17 mmHg  VENTILATOR SETTINGS: Vent Mode:  [-] PSV;CPAP FiO2 (%):  [40 %-100 %] 40 % Set Rate:  [20 bmp] 20 bmp Vt Set:  [370 mL] 370 mL PEEP:  [5 cmH20] 5 cmH20 Pressure Support:  [12 cmH20] 12 cmH20 Plateau Pressure:  [18 cmH20-19 cmH20] 18 cmH20  INTAKE / OUTPUT:  Intake/Output Summary (Last 24 hours) at 08/06/14  0914 Last data filed at 08/06/14 0900  Gross per 24 hour  Intake 6021.25 ml  Output   2530 ml  Net 3491.25 ml    PHYSICAL EXAMINATION: General: no distress, sedated on fentanyl HEENT: Rt frontal suture line intact bruising R eye, ett PULM: CTA B CV RRR no mgr Ab: soft, mild distention, gurgling bowel sounds Ext: warm, ++ edema, extremely edematous Neuro: RASS 0, squeezing right hand to command but no left or lower ext.  LABS:  CBC  Recent Labs Lab 08/04/14 0430 08/05/14 0512 08/06/14 0606  WBC 14.3* 20.6* 22.5*  HGB 7.0* 7.2* 7.3*  HCT 22.1* 23.2* 23.0*  PLT 330 389 388   Coag's  Recent Labs Lab 08/05/14 0512  APTT 31  INR 1.27   BMET  Recent Labs Lab 08/04/14 0430 08/05/14 0512 08/06/14 0606  NA 141 140 137  K 3.2* 4.0 3.9  CL 106 105 103  CO2 24 23 19   BUN 10 14 16   CREATININE 0.65 0.73 1.17*  GLUCOSE 181* 138* 149*   Electrolytes  Recent Labs Lab 08/04/14 0430 08/05/14 0512 08/06/14 0606  CALCIUM 8.3* 8.6 8.9  MG 1.8 2.0 1.9  PHOS 1.8* 2.3 3.2   Sepsis Markers No results for input(s): LATICACIDVEN, PROCALCITON, O2SATVEN in the last 168 hours.   ABG  Recent Labs Lab 08/03/14 0500 08/05/14 0500  PHART 7.423 7.388  PCO2ART 32.5* 36.0  PO2ART 170.0* 164.0*   Cardiac Enzymes  Recent Labs Lab 08/01/14 0447  PROBNP 683.6*  Glucose  Recent Labs Lab 08/05/14 1215 08/05/14 1548 08/05/14 1954 08/05/14 2343 08/06/14 0431 08/06/14 0742  GLUCAP 123* 101* 115* 109* 133* 132*    Imaging Dg Chest Port 1 View  08/05/2014   CLINICAL DATA:  Acute respiratory failure.  Tracheostomy.  EXAM: PORTABLE CHEST - 1 VIEW  COMPARISON:  08/05/2014  FINDINGS: New tracheostomy tube which is well seated. No deviation of the trachea or new upper mediastinal widening. No pneumothorax.  Normal heart size and mediastinal contours for technique. Improved lung aeration with persistent atelectasis or layering pleural fluid. Stable positioning of right  IJ catheter and right upper extremity PICC. A feeding tube is been removed.  IMPRESSION: No acute findings after tracheostomy placement.   Electronically Signed   By: Tiburcio PeaJonathan  Watts M.D.   On: 08/05/2014 10:48   Dg Chest Port 1 View  08/05/2014   CLINICAL DATA:  Endotracheal tube.  EXAM: PORTABLE CHEST - 1 VIEW  COMPARISON:  08/03/2014  FINDINGS: Endotracheal tube tip between the clavicular heads and carina, stable from yesterday. Right IJ central line, tip at the SVC. Right upper extremity PICC, tip at the upper cavoatrial junction. A gastric suction tube remains coiled in the proximal stomach.  Increased hazy appearance of the lower lungs, appearance suggesting atelectasis and/or pleural fluid. No pulmonary edema. No pneumothorax.  Normal heart size.  IMPRESSION: 1. Stable positioning of tubes and lines. 2. Worsening basilar aeration, likely increasing atelectasis and pleural fluid.   Electronically Signed   By: Tiburcio PeaJonathan  Watts M.D.   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 Would recommend backing off on fluid at this point and actually active diureses but will defer to NS at this point. RASS goal: 0. Fentanyl gtt for analgesia/sedation. Nimodipine for spasm. Started statin 11/4. Daily WUA. Head CT today and ?angiogram per NS.  PULMONARY OETT 11/2 >>> A: Acute hypoxemic respiratory failure 2nd to impaired airway protection. COPD, not in exacerbation. Concern for upper airway edema 11/05. Not weanable 11/7 P:   ABG now as patient appears  agonal on PS trial. If ABG is normal then will continue with PS trials, if not then will place back to full support at least until able to aggressively diurese. F/u CXR and ABG in AM. PRN BD's Will need diureses once neuro status is more stable.  CARDIOVASCULAR Rt IJ CVL 11/03 >> A:  HHH therapy in setting of SAH P:  CVP monitoring Levophed for SBP goal 180-200  RENAL  Recent Labs Lab 08/04/14 0430 08/05/14 0512 08/06/14 0606  NA 141 140 137    Recent Labs Lab 08/04/14 0430 08/05/14 0512 08/06/14 0606  K 3.2* 4.0 3.9   A:   Mild hypernatremia Hypokalemia  P:   F/u BMET. Replete electrolytes as indicated. On 1/2 ns per ns at 120 cc /hr, lasix d/ced, will hold off diureses until ok with NS. Free H20 11/8 ,100 cc q 8 h. Remains on 1/2 NS at 120, recommend stopping IVF at this point, patient is up >20 liters since admission.  GASTROINTESTINAL A:   Nutrition P:   Tube feeds while on vent SUP: pepcid  HEMATOLOGIC A:   Anemia of critical illness. Leukocytosis. P:  F/u CBC SCD for DVT prevention  INFECTIOUS A:   Sepsis 11/04 ?source P:   Finished 10 days of vanc/zosyn, stopped 11/12.  Blood 11/02 >>NTD Blood 11/03 >>NTD Blood 11/04 >>NTD C diff 11/06 >> neg Sputum 11/2>>neg UC 11-2>>neg  ENDOCRINE A:   No acute issues P:   SSI while on tube feeds  FAMILY  - Updates: No family bedside  TODAY'S SUMMARY:  Per NS, need more time for patient to declare her self.  Hyperdynamic therapy continued but recommend at least backing off IVF, severe pulmonary edema and diffuse peripheral edema but will defer to NS.  CC time 35 min.  Alyson ReedyWesam G. Yacoub, M.D. [facility]monary/Critical Care Medicine. Pager: [phone]. After hours pager: [phone].

## 2014-08-06 NOTE — Progress Notes (Signed)
Transcranial Doppler  Date POD PCO2 HCT BP  MCA ACA PCA OPHT SIPH VERT Basilar  07/28/14 MS   29.8 108/46 Right  Left   105  105   -69  -91   66  32   16  10   *  73   -27  -32   *  *    11-6- 15 SB     Right  Left   91  126   -57  -117   52  37/44   32  17   63  88   --  -26     *    08-02-14 VS     Right  Left   53  76   -41  -65   49  59   30  23   27  24    -79  -56   -85      08-04-14 SB      Right  Left   119  119   -72  -69   -50  63   23  20   84  67   -51  --     --    08-06-14 SB      Right  Left   139  124   -41  -77   69  69   15  24   76  129   --  --     -65         Right  Left                                            Right  Left                                        MCA = Middle Cerebral Artery      OPHT = Opthalmic Artery     BASILAR = Basilar Artery   ACA = Anterior Cerebral Artery     SIPH = Carotid Siphon PCA = Posterior Cerebral Artery   VERT = Verterbral Artery                   Normal MCA = 62+\-12 ACA = 50+\-12 PCA = 42+\-23   *Unable to insonate  08/06/2014 4:26 PM Graybar ElectricVirginia Slaughter, RVS

## 2014-08-06 NOTE — Progress Notes (Addendum)
NUTRITION FOLLOW-UP  INTERVENTION:  Vital High Protein to 45 ml/hr via PEG  Tube feeding regimen provides 1080 kcal (97% of needs), 94 grams of protein, and 902 ml of H2O.   NUTRITION DIAGNOSIS: Inadequate oral intake related to inability to eat as evidenced by NPO status; ongoing.   Goal: Pt to meet >/= 90% of their estimated nutrition needs; met.    Monitor:  Respiratory status, TF tolerance, weight trend  ASSESSMENT: Pt with hx of severe COPD admitted for [facility].  Pt s/p angiogram and clipping.   Patient is currently on ventilator support via trach MV: 7.3 L/min Temp (24hrs), Avg:98.8 F (37.1 C), Min:97.7 F (36.5 C), Max:99.3 F (37.4 C)  Pt discussed during ICU rounds and with RN.  Pt s/p trach and PEG 11/12.  Per MD notes pt with possibly worsening vasospasm, angiogram and possible angioplasty 11/13. Pt out of room.  Weight is up almost 60 lb due to edema. No free water ordered at this time.   Height: Ht Readings from Last 1 Encounters:  08/01/14 5' (1.524 m)    Weight: Wt Readings from Last 1 Encounters:  08/06/14 159 lb 13.3 oz (72.5 kg)  Admission weight: 102 lb (46.7 kg) 11/2 Pt positive 26 L since admission   BMI:  Body mass index is 31.22 kg/(m^2).  Estimated Nutritional Needs: Kcal: 1110 Protein: 65-80 grams Fluid: > 1.5 L/day  Skin: WDL  Diet Order: Diet NPO time specified   Intake/Output Summary (Last 24 hours) at 08/06/14 1018 Last data filed at 08/06/14 0900  Gross per 24 hour  Intake 5840.98 ml  Output   2490 ml  Net 3350.98 ml    Last BM: 11/12  Labs:   Recent Labs Lab 08/04/14 0430 08/05/14 0512 08/06/14 0606  NA 141 140 137  K 3.2* 4.0 3.9  CL 106 105 103  CO2 _0 BUN _1 CREATININE 0.65 0.73 1.17*  CALCIUM 8.3* 8.6 8.9  MG 1.8 2.0 1.9  PHOS 1.8* 2.3 3.2  GLUCOSE 181* 138* 149*    CBG (last 3)   Recent Labs  08/05/14 2343 08/06/14 0431 08/06/14 0742  GLUCAP 109* 133* 132*    Scheduled  Meds: . albumin human  12.5 g Intravenous Q6H  . antiseptic oral rinse  7 mL Mouth Rinse QID  . chlorhexidine  15 mL Mouth Rinse BID  . famotidine  20 mg Per Tube Q12H  . feeding supplement (VITAL HIGH PROTEIN)  1,000 mL Per Tube Q24H  . fentaNYL  200 mcg Intravenous Once  . insulin aspart  0-20 Units Subcutaneous 6 times per day  . ipratropium-albuterol  3 mL Nebulization Q4H  . levETIRAcetam  500 mg Intravenous Q12H  . NiMODipine  60 mg Per Tube Q4H  . simvastatin  40 mg Oral q1800  . sodium chloride  10-40 mL Intracatheter Q12H    Continuous Infusions: . sodium chloride 120 mL/hr at 08/06/14 0900  . sodium chloride Stopped (08/06/14 0900)  . fentaNYL infusion INTRAVENOUS 150 mcg/hr (08/06/14 0945)  . norepinephrine (LEVOPHED) Adult infusion 30 mcg/min (08/06/14 0942)    [place], [place], [place] Pager [phone] After Hours Pager

## 2014-08-07 DIAGNOSIS — R601 Generalized edema: Secondary | ICD-10-CM

## 2014-08-07 LAB — CBC
HCT: 21.2 % — ABNORMAL LOW (ref 36.0–46.0)
Hemoglobin: 6.6 g/dL — CL (ref 12.0–15.0)
MCH: 27.7 pg (ref 26.0–34.0)
MCHC: 31.1 g/dL (ref 30.0–36.0)
MCV: 89.1 fL (ref 78.0–100.0)
Platelets: 416 10*3/uL — ABNORMAL HIGH (ref 150–400)
RBC: 2.38 MIL/uL — ABNORMAL LOW (ref 3.87–5.11)
RDW: 15.7 % — ABNORMAL HIGH (ref 11.5–15.5)
WBC: 25.3 10*3/uL — ABNORMAL HIGH (ref 4.0–10.5)

## 2014-08-07 LAB — POCT I-STAT 3, ART BLOOD GAS (G3+)
ACID-BASE DEFICIT: 8 mmol/L — AB (ref 0.0–2.0)
Acid-base deficit: 8 mmol/L — ABNORMAL HIGH (ref 0.0–2.0)
BICARBONATE: 20.5 meq/L (ref 20.0–24.0)
Bicarbonate: 20.4 mEq/L (ref 20.0–24.0)
O2 SAT: 96 %
O2 SAT: 94 %
PCO2 ART: 56.8 mmHg — AB (ref 35.0–45.0)
PH ART: 7.162 — AB (ref 7.350–7.450)
Patient temperature: 98.6
TCO2: 22 mmol/L (ref 0–100)
TCO2: 22 mmol/L (ref 0–100)
pCO2 arterial: 55.8 mmHg — ABNORMAL HIGH (ref 35.0–45.0)
pH, Arterial: 7.174 — CL (ref 7.350–7.450)
pO2, Arterial: 102 mmHg — ABNORMAL HIGH (ref 80.0–100.0)
pO2, Arterial: 91 mmHg (ref 80.0–100.0)

## 2014-08-07 LAB — BLOOD GAS, ARTERIAL
ACID-BASE DEFICIT: 6.8 mmol/L — AB (ref 0.0–2.0)
Bicarbonate: 19.5 mEq/L — ABNORMAL LOW (ref 20.0–24.0)
Drawn by: 28098
FIO2: 0.4 %
LHR: 20 {breaths}/min
MECHVT: 370 mL
O2 SAT: 98.4 %
PATIENT TEMPERATURE: 98.6
PEEP/CPAP: 5 cmH2O
PO2 ART: 122 mmHg — AB (ref 80.0–100.0)
TCO2: 21 mmol/L (ref 0–100)
pCO2 arterial: 48.5 mmHg — ABNORMAL HIGH (ref 35.0–45.0)
pH, Arterial: 7.228 — ABNORMAL LOW (ref 7.350–7.450)

## 2014-08-07 LAB — BASIC METABOLIC PANEL
Anion gap: 13 (ref 5–15)
BUN: 17 mg/dL (ref 6–23)
CO2: 21 meq/L (ref 19–32)
CREATININE: 1.03 mg/dL (ref 0.50–1.10)
Calcium: 8.9 mg/dL (ref 8.4–10.5)
Chloride: 107 mEq/L (ref 96–112)
GFR calc Af Amer: 65 mL/min — ABNORMAL LOW (ref >90)
GFR calc non Af Amer: 56 mL/min — ABNORMAL LOW (ref >90)
Glucose, Bld: 217 mg/dL — ABNORMAL HIGH (ref 70–99)
Potassium: 3.9 mEq/L (ref 3.7–5.3)
Sodium: 141 mEq/L (ref 137–147)

## 2014-08-07 LAB — PHOSPHORUS: Phosphorus: 3.1 mg/dL (ref 2.3–4.6)

## 2014-08-07 LAB — GLUCOSE, CAPILLARY
GLUCOSE-CAPILLARY: 158 mg/dL — AB (ref 70–99)
GLUCOSE-CAPILLARY: 172 mg/dL — AB (ref 70–99)
Glucose-Capillary: 157 mg/dL — ABNORMAL HIGH (ref 70–99)
Glucose-Capillary: 176 mg/dL — ABNORMAL HIGH (ref 70–99)
Glucose-Capillary: 199 mg/dL — ABNORMAL HIGH (ref 70–99)
Glucose-Capillary: 199 mg/dL — ABNORMAL HIGH (ref 70–99)
Glucose-Capillary: 215 mg/dL — ABNORMAL HIGH (ref 70–99)

## 2014-08-07 LAB — MAGNESIUM: Magnesium: 2 mg/dL (ref 1.5–2.5)

## 2014-08-07 LAB — PREPARE RBC (CROSSMATCH)

## 2014-08-07 LAB — TRIGLYCERIDES: Triglycerides: 60 mg/dL (ref <150)

## 2014-08-07 MED ORDER — SODIUM CHLORIDE 0.9 % IV SOLN: Freq: Once | INTRAVENOUS | Status: DC

## 2014-08-07 MED ORDER — FUROSEMIDE 10 MG/ML IJ SOLN
40.0000 mg | Freq: Once | INTRAMUSCULAR | Status: AC
  Administered 2014-08-07: 40 mg via INTRAVENOUS
  Filled 2014-08-07: qty 4

## 2014-08-07 MED ORDER — VASOPRESSIN 20 UNIT/ML IV SOLN
0.0300 [IU]/min | INTRAVENOUS | Status: DC
  Administered 2014-08-08: 0.03 [IU]/min via INTRAVENOUS
  Filled 2014-08-07: qty 2

## 2014-08-07 MED ORDER — PROPOFOL 10 MG/ML IV EMUL
0.0000 ug/kg/min | INTRAVENOUS | Status: DC
  Administered 2014-08-07: 5 ug/kg/min via INTRAVENOUS
  Administered 2014-08-09: 10 ug/kg/min via INTRAVENOUS
  Filled 2014-08-07 (×2): qty 100

## 2014-08-07 NOTE — Progress Notes (Signed)
ABG    Component Value Date/Time   PHART 7.162* 08/07/2014 2238   PCO2ART 56.8* 08/07/2014 2238   PO2ART 91.0 08/07/2014 2238   HCO3 20.4 08/07/2014 2238   TCO2 22 08/07/2014 2238   ACIDBASEDEF 8.0* 08/07/2014 2238   O2SAT 94.0 08/07/2014 2238   Gave verbal report to MD, and reported to RN.

## 2014-08-07 NOTE — Progress Notes (Signed)
Pt placed on C/PS trial at this time. Increased PS to 12 due to decreased VT's. Pt continued to have increased WOB on PS. Placed back on full support after on trial, will try again later today as tolerated. RT will monitor.

## 2014-08-07 NOTE — Progress Notes (Signed)
Patient ID: Stephanie SilvasVicky D Doyle, female   DOB: 08-18-1951, 63 y.o.   MRN: [id number] BP 161/80 mmHg  Pulse 100  Temp(Src) 98 F (36.7 C) (Oral)  Resp 24  Ht 5' (1.524 m)  Wt 74.9 kg (165 lb 2 oz)  BMI 32.25 kg/m2  SpO2 100% Alert, tracking, perrl Following some commands Wound is clean and dry, no signs of infection Moving right >than left Exam is stable

## 2014-08-07 NOTE — Plan of Care (Signed)
Problem: Phase I Progression Outcomes Goal: VTE prophylaxis Outcome: Completed/Met Date Met:  08/07/14 Goal: GIProphysixis Outcome: Completed/Met Date Met:  08/07/14 Goal: Oral Care per Protocol Outcome: Completed/Met Date Met:  08/07/14 Goal: HOB elevated 30 degrees Outcome: Completed/Met Date Met:  08/07/14 Goal: VAP prevention protocol initiated Outcome: Completed/Met Date Met:  08/07/14 Goal: Sedation Protocol initiated if indicated Outcome: Completed/Met Date Met:  08/07/14 Goal: Patient tolerating nututrition at goal Outcome: Progressing Goal: Tracheostomy by Vent Day 14 Outcome: Completed/Met Date Met:  08/07/14

## 2014-08-07 NOTE — Progress Notes (Signed)
eLink Physician-Brief Progress Note Patient Name: Stephanie SilvasVicky D Altemus DOB: 03/14/1951 MRN: [id number]   Date of Service  08/07/2014  HPI/Events of Note   Repeat ABG noted. Still dyssyncronus   eICU Interventions   RN instructed to aggressively sedate patient  REpeat ABG in 3 hrs If no improvement can consider paralysis       Intervention Category Major Interventions: Respiratory failure - evaluation and management  Tarae Wooden R. 08/07/2014, 10:46 PM

## 2014-08-07 NOTE — Progress Notes (Signed)
PULMONARY / CRITICAL CARE MEDICINE   Name: Stephanie Doyle MRN: [id number] DOB: 1950-11-22    ADMISSION DATE:  07/26/2014 CONSULTATION DATE:  07/26/2014  REFERRING MD :  Dr. Franky Machoabbell  CHIEF COMPLAINT:  Headache  INITIAL PRESENTATION: 63 year old female with no known medical history [facility] 11/2 with severe headache, found to have SAH. AMS in ED requiring intubation. [facility]. PCCM consulted for vent and ICU management.  STUDIES:  11/2 CT head > diffuse SAH, frontal and temporal bilaterally.  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 Status post anterior communicating artery aneurysm clipping; frontal lobe edema stable, small volume intraventricular hemorrhage stable, regression of SAH, no new intracranial abnormality  SIGNIFICANT EVENTS: 11/2[facility]- SAH, Intubated for airway protection, [facility] 11[facility]4- Right pterional craniotomy for clipping of anterior communicating artery aneurysm, complex 11/5 - no cuff leak >> started steroids 11/7 not weaning , lowest ps was 12 11/12 trach > 11/14 hgb 6.6, no active sign of bleeding  SUBJECTIVE:   Failed weaning, hgb 6.6 without bleeding  VITAL SIGNS: Temp:  [98 F (36.7 C)-98.7 F (37.1 C)] 98 F (36.7 C) (11/14 0800) Pulse Rate:  [99-123] 106 (11/14 1100) Resp:  [16-34] 16 (11/14 1100) BP: (156-215)/(46-149) 176/61 mmHg (11/14 1100) SpO2:  [99 %-100 %] 100 % (11/14 1100) FiO2 (%):  [40 %] 40 % (11/14 0816) Weight:  [74.9 kg (165 lb 2 oz)] 74.9 kg (165 lb 2 oz) (11/14 0600)   HEMODYNAMICS: CVP:  [21 mmHg-29 mmHg] 25 mmHg  VENTILATOR SETTINGS: Vent Mode:  [-] PRVC FiO2 (%):  [40 %] 40 % Set Rate:   [20 bmp] 20 bmp Vt Set:  [370 mL] 370 mL PEEP:  [5 cmH20] 5 cmH20 Plateau Pressure:  [26 cmH20-30 cmH20] 30 cmH20  INTAKE / OUTPUT:  Intake/Output Summary (Last 24 hours) at 08/07/14 1117 Last data filed at 08/07/14 1100  Gross per 24 hour  Intake 6705.54 ml  Output   2800 ml  Net 3905.54 ml    PHYSICAL EXAMINATION:  Gen: on vent HEENT: scalp incision c/d/i, trach in place PULM: crackles/rhonchi bilaterally CV: RRR, no mgr Ab: BS+, soft Ext: massive edema/anasarca Derm: no breakdown  LABS:  CBC  Recent Labs Lab 08/05/14 0512 08/06/14 0606 08/07/14 0450  WBC 20.6* 22.5* 25.3*  HGB 7.2* 7.3* 6.6*  HCT 23.2* 23.0* 21.2*  PLT 389 388 416*   Coag's  Recent Labs Lab 08/05/14 0512  APTT 31  INR 1.27   BMET  Recent Labs Lab 08/05/14 0512 08/06/14 0606 08/07/14 0450  NA 140 137 141  K 4.0 3.9 3.9  CL 105 103 107  CO2 23 19 21   BUN 14 16 17   CREATININE 0.73 1.17* 1.03  GLUCOSE 138* 149* 217*   Electrolytes  Recent Labs Lab 08/05/14 0512 08/06/14 0606 08/07/14 0450  CALCIUM 8.6 8.9 8.9  MG 2.0 1.9 2.0  PHOS 2.3 3.2 3.1   Sepsis Markers No results for input(s): LATICACIDVEN, PROCALCITON, O2SATVEN in the last 168 hours.   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 Initial encounter. Severe headache.  EXAM: CT HEAD WITHOUT CONTRAST  TECHNIQUE: Contiguous axial images were obtained from the base of the skull through the vertex without  intravenous contrast.  COMPARISON:  CTA and CT perfusion 08/03/2014 and earlier.  FINDINGS: Patient no longer intubated. Mild paranasal sinus mucosal thickening and small sphenoid sinus fluid levels. New left mastoid and tympanic cavity opacification. Sequelae of right frontotemporal craniotomy. No other acute osseous abnormality. Overlying postoperative changes to the scalp with superficial scalp hematoma and skin staples. Visualized orbit soft tissues are within normal limits.  T-shaped anterior communicating artery aneurysm clip re- identified. Clip positioning grossly stable. Hypodensity suggesting cytotoxic edema re - identified in both anterior inferior frontal lobes and tracking up the bilateral cingulate gyri. This has not significantly changed since 08/03/2014. No midline shift. Small volume intraventricular hemorrhage is stable. No ventriculomegaly. Subarachnoid hemorrhage has regressed over the series of exams, small volume residual. Gray-white matter differentiation elsewhere is stable.  IMPRESSION: 1. Status post anterior communicating artery aneurysm clipping with T-shaped aneurysm clip. 2. Stable anterior inferior frontal lobe and bilateral cingulate gyrus edema. No significant mass effect. 3. Stable small volume of intraventricular hemorrhage with no ventriculomegaly. Regression of subarachnoid hemorrhage. 4. No new intracranial abnormality.   Electronically Signed   By: Augusto GambleLee  Hall M.D.   On: 08/06/2014 11:39   Dg Chest Port 1 View  08/06/2014   CLINICAL DATA:  Difficulty breathing  EXAM: PORTABLE CHEST - 1 VIEW  COMPARISON:  August 05, 2014  FINDINGS: Tracheostomy catheter tip is 4.7 cm above the carina. There are two central catheters, both with tips in the superior vena cava. No pneumothorax. There is mild interstitial edema with cardiomegaly. Pulmonary vascularity is normal. There are small pleural effusions bilaterally. There is no appreciable airspace consolidation. There is atelectatic  change in the right middle lobe and left base regions.  IMPRESSION: Findings felt to represent a degree of congestive heart failure. Areas of patchy atelectasis but no frank airspace consolidation. Tube and catheters as described without pneumothorax.   Electronically Signed   By: Bretta BangWilliam  Woodruff M.D.   On: 08/06/2014 08:00    Intake/Output Summary (Last 24 hours) at 08/07/14 1117 Last data filed at 08/07/14 1100  Gross per 24 hour  Intake 6705.54 ml  Output   2800 ml  Net 3905.54 ml   ASSESSMENT / PLAN:  NEUROLOGIC A:   Subarachnoid hemorrhage ACA aneurysm > now s/p surgical clipping Frontal Lobe infarcts on 11/4 CT head P:   Management per Neurosurgery Continue hyperdynamic therapy, pressors to increase SBP to 180-200 per NS recommendations. Fluid per neurosurgery RASS goal: 0. Fentanyl gtt for analgesia/sedation. Nimodipine for spasm. Started statin 11/4. Daily WUA.   PULMONARY OETT 11/2 >>> A: Acute hypoxemic respiratory failure > massive pulmonary edema COPD, not in exacerbation. Concern for upper airway edema s/p tracheostomy 11/12 P:   SBT as tolerated Lasix x1 dose OK by NSGY 11/14 F/u CXR in AM. PRN BD's  CARDIOVASCULAR Rt IJ CVL 11/03 >> A:  HHH therapy in setting of SAH P:  CVP monitoring Levophed for SBP goal 180-200 nimodipine  RENAL  Recent Labs Lab 08/05/14 0512 08/06/14 0606 08/07/14 0450  NA 140 137 141    Recent Labs Lab 08/05/14 0512 08/06/14 0606 08/07/14 0450  K 4.0 3.9 3.9   A:   Mild hypernatremia > resolved Hypokalemia  P:   F/u BMET. Replete electrolytes as indicated. On 1/2 ns per neurosurgery, plan is to maintain that rate Lasix x1 11/14  GASTROINTESTINAL A:   Nutrition P:   Tube feeds while on vent SUP: pepcid  HEMATOLOGIC A:   Anemia of critical illness > 11/14 Hgb 6.6 without evidence of bleeding Leukocytosis. P:  F/u CBC SCD for DVT prevention Transfuse 1 U PRBC 11/14  INFECTIOUS A:   Sepsis  11/04 > no clear source P:   Finished 10 days of vanc/zosyn, stopped 11/12  Blood 11/02 >>NTD Blood 11/03 >>NTD Blood 11/04 >>NTD C diff 11/06 >> neg Sputum 11/2>>neg UC 11-2>>neg  ENDOCRINE A:   No acute issues P:   SSI while on tube feeds  FAMILY  - Updates: No family bedside  TODAY'S SUMMARY:  Neuro prognosis per neurosurgery, maintaining levophed per neurosurgery Will give one dose lasix and 1 U pRBC  CC time 35 min.  Heber Shamokin, MD [place] [facility] Pager: [phone] Cell: [phone] If no response, call [phone]

## 2014-08-07 NOTE — Progress Notes (Signed)
eLink Physician-Brief Progress Note Patient Name: Stephanie Doyle DOB: 11-Jul-1951 MRN: [id number]   Date of Service  08/07/2014  HPI/Events of Note  Need SBP > 180  eICU Interventions  Vasopressin added      Intervention Category Evaluation Type: Other  Shan Levansatrick Wright 08/07/2014, 11:55 PM

## 2014-08-07 NOTE — Progress Notes (Signed)
Pt seen and examined. No issues overnight. CT scan completed this am. Tolerated trach/PEG without complication yesterday  EXAM: Temp:  [98 F (36.7 C)-99.8 F (37.7 C)] 98 F (36.7 C) (11/14 0800) Pulse Rate:  [99-124] 100 (11/14 0816) Resp:  [16-34] 24 (11/14 0816) BP: (156-215)/(46-149) 161/80 mmHg (11/14 0816) SpO2:  [99 %-100 %] 100 % (11/14 0816) FiO2 (%):  [40 %] 40 % (11/14 0816) Weight:  [74.9 kg (165 lb 2 oz)] 74.9 kg (165 lb 2 oz) (11/14 0600) Intake/Output      11/13 0701 - 11/14 0700 11/14 0701 - 11/15 0700   I.V. (mL/kg) 3981.5 (53.2)    Other 340    NG/GT 960    IV Piggyback 1200    Total Intake(mL/kg) 6481.5 (86.5)    Urine (mL/kg/hr) 2995 (1.7)    Drains     Stool     Total Output 2995     Net +3486.5          Stool Occurrence 1 x     Opens eyes spontaneously ?tracking Occasionally FC with RUE. W/D RLE No significant movement LUE/LLE Wound c/d/i  LABS: Lab Results  Component Value Date   CREATININE 1.03 08/07/2014   BUN 17 08/07/2014   NA 141 08/07/2014   K 3.9 08/07/2014   CL 107 08/07/2014   CO2 21 08/07/2014   Lab Results  Component Value Date   WBC 25.3* 08/07/2014   HGB 6.6* 08/07/2014   HCT 21.2* 08/07/2014   MCV 89.1 08/07/2014   PLT 416* 08/07/2014    IMAGING: CTH reviewed. No increasing ventriculomegaly. 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 No new strokes seen. Gray-white junction in MCA and distal ACA territory preserved.  IMPRESSION: - 63 y.o. female SAH d# 11 s/p Acom clipping - Appear relatively stable from neurological standpoint  PLAN: - Cont observation. With stable exam, I do not believe the patient requires angiogram or angioplasty at this time - Hyperdynamic therapy with IVF/colloid for expansion and Levophed for goal SBP 180mmHg.  I spoke with the patient's family today regarding current status and plan of care over the next few days. I told them we would continue to monitor and medically treat her  spasm. We can reasses her clinical condition in a few days after the spasm period has concluded and review goals of care at that time depending on her neurologic exam. All their questions were answered.

## 2014-08-07 NOTE — Progress Notes (Addendum)
eLink Physician-Brief Progress Note Patient Name: Stephanie SilvasVicky D Doyle DOB: 13-Sep-1951 MRN: [id number]   Date of Service  08/07/2014  HPI/Events of Note   No improvement in ABG. Actually slightly worse. Still appears uncomfortable and is clearly dysyncronus.   eICU Interventions   Start propofol drip Repeat ABG in 2 hrs      Intervention Category Major Interventions: Respiratory failure - evaluation and management  Fionn Stracke R. 08/07/2014, 7:35 PM

## 2014-08-07 NOTE — Progress Notes (Signed)
eLink Physician-Brief Progress Note Patient Name: Stephanie SilvasVicky D Doyle DOB: 11-Feb-1951 MRN: [id number]   Date of Service  08/07/2014  HPI/Events of Note   ABG reviewed with respiratory therapist. Patient double triggering vent and appears uncomfortable.    eICU Interventions   Increase Fentanyl Drip rate to 300 mcg/hr Bolus 100 mcg now Repeat ABG in 2 hrs      Intervention Category Major Interventions: Acid-Base disturbance - evaluation and management  Ambar Raphael R. 08/07/2014, 4:37 PM

## 2014-08-07 NOTE — Progress Notes (Signed)
CRITICAL VALUE ALERT  Critical value received:  Hbg 6.6  Date of notification:  08/07/2014   Time of notification:  0641  Critical value read back:Yes.    Nurse who received alert:  Everette RankJennifer Yvone Slape, RN  MD notified (1st page):  Pola CornELink   Time of first page:  0645  MD notified (2nd page):  Time of second page:  Responding MD:  Pola CornELink   Time MD responded:  [phone]  Left message with Lowella BandyNikki Pola Corn(ELink RN) who reported she would leave the message with the MD. Will continue to monitor for new orders.

## 2014-08-08 ENCOUNTER — Inpatient Hospital Stay (HOSPITAL_COMMUNITY): Payer: 59

## 2014-08-08 DIAGNOSIS — R6520 Severe sepsis without septic shock: Secondary | ICD-10-CM

## 2014-08-08 DIAGNOSIS — N179 Acute kidney failure, unspecified: Secondary | ICD-10-CM

## 2014-08-08 DIAGNOSIS — A419 Sepsis, unspecified organism: Secondary | ICD-10-CM

## 2014-08-08 LAB — POCT I-STAT 3, ART BLOOD GAS (G3+)
Acid-base deficit: 9 mmol/L — ABNORMAL HIGH (ref 0.0–2.0)
Bicarbonate: 19.6 mEq/L — ABNORMAL LOW (ref 20.0–24.0)
O2 SAT: 92 %
PCO2 ART: 27.1 mmHg — AB (ref 35.0–45.0)
Patient temperature: 68.6
TCO2: 21 mmol/L (ref 0–100)
pH, Arterial: 7.367 (ref 7.350–7.450)
pO2, Arterial: 28 mmHg — CL (ref 80.0–100.0)

## 2014-08-08 LAB — BLOOD GAS, ARTERIAL
ACID-BASE DEFICIT: 10.1 mmol/L — AB (ref 0.0–2.0)
BICARBONATE: 16.2 meq/L — AB (ref 20.0–24.0)
Drawn by: 23588
FIO2: 0.4 %
LHR: 30 {breaths}/min
MECHVT: 370 mL
O2 Saturation: 98.3 %
PEEP: 5 cmH2O
PO2 ART: 128 mmHg — AB (ref 80.0–100.0)
Patient temperature: 98.6
TCO2: 17.4 mmol/L (ref 0–100)
pCO2 arterial: 40.8 mmHg (ref 35.0–45.0)
pH, Arterial: 7.222 — ABNORMAL LOW (ref 7.350–7.450)

## 2014-08-08 LAB — URINALYSIS, ROUTINE W REFLEX MICROSCOPIC
Bilirubin Urine: NEGATIVE
GLUCOSE, UA: NEGATIVE mg/dL
Ketones, ur: NEGATIVE mg/dL
Nitrite: NEGATIVE
PH: 5 (ref 5.0–8.0)
Protein, ur: NEGATIVE mg/dL
SPECIFIC GRAVITY, URINE: 1.013 (ref 1.005–1.030)
Urobilinogen, UA: 0.2 mg/dL (ref 0.0–1.0)

## 2014-08-08 LAB — BASIC METABOLIC PANEL
ANION GAP: 16 — AB (ref 5–15)
Anion gap: 14 (ref 5–15)
BUN: 24 mg/dL — ABNORMAL HIGH (ref 6–23)
BUN: 25 mg/dL — ABNORMAL HIGH (ref 6–23)
CALCIUM: 8.7 mg/dL (ref 8.4–10.5)
CO2: 18 meq/L — AB (ref 19–32)
CO2: 16 meq/L — AB (ref 19–32)
CREATININE: 1.42 mg/dL — AB (ref 0.50–1.10)
Calcium: 8.8 mg/dL (ref 8.4–10.5)
Chloride: 103 mEq/L (ref 96–112)
Chloride: 108 mEq/L (ref 96–112)
Creatinine, Ser: 1.47 mg/dL — ABNORMAL HIGH (ref 0.50–1.10)
GFR calc Af Amer: 44 mL/min — ABNORMAL LOW (ref >90)
GFR calc Af Amer: 42 mL/min — ABNORMAL LOW (ref >90)
GFR, EST NON AFRICAN AMERICAN: 38 mL/min — AB (ref >90)
GFR, EST NON AFRICAN AMERICAN: 36 mL/min — AB (ref >90)
GLUCOSE: 104 mg/dL — AB (ref 70–99)
Glucose, Bld: 152 mg/dL — ABNORMAL HIGH (ref 70–99)
Potassium: 3.9 mEq/L (ref 3.7–5.3)
Potassium: 3.6 mEq/L — ABNORMAL LOW (ref 3.7–5.3)
SODIUM: 140 meq/L (ref 137–147)
Sodium: 135 mEq/L — ABNORMAL LOW (ref 137–147)

## 2014-08-08 LAB — CBC WITH DIFFERENTIAL/PLATELET
BASOS ABS: 0.1 10*3/uL (ref 0.0–0.1)
Basophils Relative: 0 % (ref 0–1)
Eosinophils Absolute: 0.1 10*3/uL (ref 0.0–0.7)
Eosinophils Relative: 0 % (ref 0–5)
HCT: 22.9 % — ABNORMAL LOW (ref 36.0–46.0)
Hemoglobin: 7.4 g/dL — ABNORMAL LOW (ref 12.0–15.0)
LYMPHS ABS: 1 10*3/uL (ref 0.7–4.0)
Lymphocytes Relative: 4 % — ABNORMAL LOW (ref 12–46)
MCH: 28.5 pg (ref 26.0–34.0)
MCHC: 32.3 g/dL (ref 30.0–36.0)
MCV: 88.1 fL (ref 78.0–100.0)
Monocytes Absolute: 3.7 10*3/uL — ABNORMAL HIGH (ref 0.1–1.0)
Monocytes Relative: 14 % — ABNORMAL HIGH (ref 3–12)
Neutro Abs: 22.6 10*3/uL — ABNORMAL HIGH (ref 1.7–7.7)
Neutrophils Relative %: 82 % — ABNORMAL HIGH (ref 43–77)
PLATELETS: 349 10*3/uL (ref 150–400)
RBC: 2.6 MIL/uL — AB (ref 3.87–5.11)
RDW: 15.9 % — ABNORMAL HIGH (ref 11.5–15.5)
WBC: 27.4 10*3/uL — AB (ref 4.0–10.5)

## 2014-08-08 LAB — GLUCOSE, CAPILLARY
GLUCOSE-CAPILLARY: 112 mg/dL — AB (ref 70–99)
Glucose-Capillary: 136 mg/dL — ABNORMAL HIGH (ref 70–99)
Glucose-Capillary: 149 mg/dL — ABNORMAL HIGH (ref 70–99)
Glucose-Capillary: 162 mg/dL — ABNORMAL HIGH (ref 70–99)
Glucose-Capillary: 94 mg/dL (ref 70–99)

## 2014-08-08 LAB — URINE MICROSCOPIC-ADD ON

## 2014-08-08 LAB — CBC
HCT: 23.9 % — ABNORMAL LOW (ref 36.0–46.0)
Hemoglobin: 7.6 g/dL — ABNORMAL LOW (ref 12.0–15.0)
MCH: 28.1 pg (ref 26.0–34.0)
MCHC: 31.8 g/dL (ref 30.0–36.0)
MCV: 88.5 fL (ref 78.0–100.0)
PLATELETS: 390 10*3/uL (ref 150–400)
RBC: 2.7 MIL/uL — AB (ref 3.87–5.11)
RDW: 15.7 % — ABNORMAL HIGH (ref 11.5–15.5)
WBC: 28.3 10*3/uL — ABNORMAL HIGH (ref 4.0–10.5)

## 2014-08-08 LAB — TROPONIN I: Troponin I: <0.3 ng/mL (ref <0.30)

## 2014-08-08 LAB — SODIUM, URINE, RANDOM: SODIUM UR: 91 meq/L

## 2014-08-08 LAB — CREATININE, URINE, RANDOM: Creatinine, Urine: 25.53 mg/dL

## 2014-08-08 MED ORDER — PIPERACILLIN-TAZOBACTAM 3.375 G IVPB
3.3750 g | Freq: Three times a day (TID) | INTRAVENOUS | Status: DC
  Filled 2014-08-08 (×2): qty 50

## 2014-08-08 MED ORDER — SODIUM CHLORIDE 0.9 % IV SOLN
INTRAVENOUS | Status: DC
  Administered 2014-08-08 – 2014-08-09 (×2): via INTRAVENOUS

## 2014-08-08 MED ORDER — VANCOMYCIN HCL IN DEXTROSE 1-5 GM/200ML-% IV SOLN
1000.0000 mg | INTRAVENOUS | Status: AC
Start: 2014-08-08 — End: 2014-08-09
  Administered 2014-08-08 – 2014-08-09 (×2): 1000 mg via INTRAVENOUS
  Filled 2014-08-08 (×2): qty 200

## 2014-08-08 MED ORDER — SODIUM CHLORIDE 0.9 % IV SOLN
250.0000 mg | Freq: Four times a day (QID) | INTRAVENOUS | Status: DC
  Administered 2014-08-08 – 2014-08-09 (×4): 250 mg via INTRAVENOUS
  Filled 2014-08-08 (×8): qty 250

## 2014-08-08 MED ORDER — PHENYLEPHRINE HCL 10 MG/ML IJ SOLN
30.0000 ug/min | INTRAVENOUS | Status: DC
  Filled 2014-08-08: qty 1

## 2014-08-08 NOTE — Progress Notes (Signed)
Aline attempt unsuccessful by two RT's.

## 2014-08-08 NOTE — Progress Notes (Signed)
Patient ID: Stephanie Doyle, female   DOB: 05-09-1951, 63 y.o.   MRN: [id number]  BP 130/58 mmHg  Pulse 103  Temp(Src) 98.1 F (36.7 C) (Axillary)  Resp 16  Ht 5' (1.524 m)  Wt 78.7 kg (173 lb 8 oz)  BMI 33.88 kg/m2  SpO2 100%  Eyes closed, does not follow commands Will trigger breaths, needed to be sedated in order to help with oxygenation Although afebrile, seems to be septic, hypotensive Will wean pressors , change fluids to normal saline Results for orders placed or performed during the hospital encounter of 07/26/14 (from the past 24 hour(s))  Type and screen     Status: None   Collection Time: 08/07/14  1:15 PM  Result Value Ref Range   ABO/RH(D) A NEG    Antibody Screen NEG    Sample Expiration 08/10/2014    Unit Number [id number]    Blood Component Type RED CELLS,LR    Unit division 00    Status of Unit ISSUED,FINAL    Transfusion Status OK TO TRANSFUSE    Crossmatch Result Compatible   Glucose, capillary     Status: Abnormal   Collection Time: 08/07/14  4:26 PM  Result Value Ref Range   Glucose-Capillary 199 (H) 70 - 99 mg/dL  Blood gas, arterial     Status: Abnormal   Collection Time: 08/07/14  5:00 PM  Result Value Ref Range   FIO2 0.40 %   Delivery systems VENTILATOR    Mode PRESSURE REGULATED VOLUME CONTROL    VT 370 mL   Rate 20 resp/min   Peep/cpap 5.0 cm H20   pH, Arterial 7.228 (L) 7.350 - 7.450   pCO2 arterial 48.5 (H) 35.0 - 45.0 mmHg   pO2, Arterial 122.0 (H) 80.0 - 100.0 mmHg   Bicarbonate 19.5 (L) 20.0 - 24.0 mEq/L   TCO2 21.0 0 - 100 mmol/L   Acid-base deficit 6.8 (H) 0.0 - 2.0 mmol/L   O2 Saturation 98.4 %   Patient temperature 98.6    Collection site LEFT RADIAL    Drawn by [id number]    Sample type ARTERIAL DRAW    Allens test (pass/fail) PASS PASS  I-STAT 3, arterial blood gas (G3+)     Status: Abnormal   Collection Time: 08/07/14  7:28 PM  Result Value Ref Range   pH, Arterial 7.174 (LL) 7.350 - 7.450   pCO2 arterial 55.8 (H) 35.0 - 45.0  mmHg   pO2, Arterial 102.0 (H) 80.0 - 100.0 mmHg   Bicarbonate 20.5 20.0 - 24.0 mEq/L   TCO2 22 0 - 100 mmol/L   O2 Saturation 96.0 %   Acid-base deficit 8.0 (H) 0.0 - 2.0 mmol/L   Patient temperature 98.6 F    Collection site RADIAL, ALLEN'S TEST ACCEPTABLE    Drawn by RT    Sample type ARTERIAL    Comment NOTIFIED PHYSICIAN   Triglycerides     Status: None   Collection Time: 08/07/14  8:00 PM  Result Value Ref Range   Triglycerides 60 <150 mg/dL  Glucose, capillary     Status: Abnormal   Collection Time: 08/07/14  8:13 PM  Result Value Ref Range   Glucose-Capillary 172 (H) 70 - 99 mg/dL   Comment 1 Documented in Chart    Comment 2 Notify RN   I-STAT 3, arterial blood gas (G3+)     Status: Abnormal   Collection Time: 08/07/14 10:38 PM  Result Value Ref Range   pH, Arterial 7.162 (LL) 7.350 -  7.450   pCO2 arterial 56.8 (H) 35.0 - 45.0 mmHg   pO2, Arterial 91.0 80.0 - 100.0 mmHg   Bicarbonate 20.4 20.0 - 24.0 mEq/L   TCO2 22 0 - 100 mmol/L   O2 Saturation 94.0 %   Acid-base deficit 8.0 (H) 0.0 - 2.0 mmol/L   Patient temperature 98.6 F    Collection site RADIAL, ALLEN'S TEST ACCEPTABLE    Drawn by RT    Sample type ARTERIAL    Comment NOTIFIED PHYSICIAN   Glucose, capillary     Status: Abnormal   Collection Time: 08/07/14 11:48 PM  Result Value Ref Range   Glucose-Capillary 157 (H) 70 - 99 mg/dL   Comment 1 Documented in Chart    Comment 2 Notify RN   CBC     Status: Abnormal   Collection Time: 08/08/14  2:08 AM  Result Value Ref Range   WBC 28.3 (H) 4.0 - 10.5 K/uL   RBC 2.70 (L) 3.87 - 5.11 MIL/uL   Hemoglobin 7.6 (L) 12.0 - 15.0 g/dL   HCT 11.923.9 (L) 14.736.0 - 82.946.0 %   MCV 88.5 78.0 - 100.0 fL   MCH 28.1 26.0 - 34.0 pg   MCHC 31.8 30.0 - 36.0 g/dL   RDW 56.215.7 (H) 13.011.5 - 86.515.5 %   Platelets 390 150 - 400 K/uL  Basic metabolic panel     Status: Abnormal   Collection Time: 08/08/14  2:08 AM  Result Value Ref Range   Sodium 135 (L) 137 - 147 mEq/L   Potassium 3.9 3.7 -  5.3 mEq/L   Chloride 103 96 - 112 mEq/L   CO2 18 (L) 19 - 32 mEq/L   Glucose, Bld 152 (H) 70 - 99 mg/dL   BUN 24 (H) 6 - 23 mg/dL   Creatinine, Ser 7.841.42 (H) 0.50 - 1.10 mg/dL   Calcium 8.7 8.4 - 69.610.5 mg/dL   GFR calc non Af Amer 38 (L) >90 mL/min   GFR calc Af Amer 44 (L) >90 mL/min   Anion gap 14 5 - 15  I-STAT 3, arterial blood gas (G3+)     Status: Abnormal   Collection Time: 08/08/14  2:26 AM  Result Value Ref Range   pH, Arterial 7.367 7.350 - 7.450   pCO2 arterial 27.1 (L) 35.0 - 45.0 mmHg   pO2, Arterial 28.0 (LL) 80.0 - 100.0 mmHg   Bicarbonate 19.6 (L) 20.0 - 24.0 mEq/L   TCO2 21 0 - 100 mmol/L   O2 Saturation 92.0 %   Acid-base deficit 9.0 (H) 0.0 - 2.0 mmol/L   Patient temperature 68.6 F    Collection site RADIAL, ALLEN'S TEST ACCEPTABLE    Drawn by RT    Sample type ARTERIAL    Comment NOTIFIED PHYSICIAN   Glucose, capillary     Status: Abnormal   Collection Time: 08/08/14  4:15 AM  Result Value Ref Range   Glucose-Capillary 136 (H) 70 - 99 mg/dL   Comment 1 Documented in Chart    Comment 2 Notify RN   Glucose, capillary     Status: Abnormal   Collection Time: 08/08/14  8:20 AM  Result Value Ref Range   Glucose-Capillary 149 (H) 70 - 99 mg/dL  Troponin I     Status: None   Collection Time: 08/08/14  9:38 AM  Result Value Ref Range   Troponin I <0.30 <0.30 ng/mL  CBC with Differential     Status: Abnormal   Collection Time: 08/08/14  9:38 AM  Result  Value Ref Range   WBC 27.4 (H) 4.0 - 10.5 K/uL   RBC 2.60 (L) 3.87 - 5.11 MIL/uL   Hemoglobin 7.4 (L) 12.0 - 15.0 g/dL   HCT 16.122.9 (L) 09.636.0 - 04.546.0 %   MCV 88.1 78.0 - 100.0 fL   MCH 28.5 26.0 - 34.0 pg   MCHC 32.3 30.0 - 36.0 g/dL   RDW 40.915.9 (H) 81.111.5 - 91.415.5 %   Platelets 349 150 - 400 K/uL   Neutrophils Relative % 82 (H) 43 - 77 %   Neutro Abs 22.6 (H) 1.7 - 7.7 K/uL   Lymphocytes Relative 4 (L) 12 - 46 %   Lymphs Abs 1.0 0.7 - 4.0 K/uL   Monocytes Relative 14 (H) 3 - 12 %   Monocytes Absolute 3.7 (H) 0.1  - 1.0 K/uL   Eosinophils Relative 0 0 - 5 %   Eosinophils Absolute 0.1 0.0 - 0.7 K/uL   Basophils Relative 0 0 - 1 %   Basophils Absolute 0.1 0.0 - 0.1 K/uL  Urinalysis, Routine w reflex microscopic     Status: Abnormal   Collection Time: 08/08/14 10:28 AM  Result Value Ref Range   Color, Urine YELLOW YELLOW   APPearance TURBID (A) CLEAR   Specific Gravity, Urine 1.013 1.005 - 1.030   pH 5.0 5.0 - 8.0   Glucose, UA NEGATIVE NEGATIVE mg/dL   Hgb urine dipstick MODERATE (A) NEGATIVE   Bilirubin Urine NEGATIVE NEGATIVE   Ketones, ur NEGATIVE NEGATIVE mg/dL   Protein, ur NEGATIVE NEGATIVE mg/dL   Urobilinogen, UA 0.2 0.0 - 1.0 mg/dL   Nitrite NEGATIVE NEGATIVE   Leukocytes, UA LARGE (A) NEGATIVE  Creatinine, urine, random     Status: None   Collection Time: 08/08/14 10:28 AM  Result Value Ref Range   Creatinine, Urine 25.53 mg/dL  Sodium, urine, random     Status: None   Collection Time: 08/08/14 10:28 AM  Result Value Ref Range   Sodium, Ur 91 mEq/L  Urine microscopic-add on     Status: Abnormal   Collection Time: 08/08/14 10:28 AM  Result Value Ref Range   WBC, UA 7-10 <3 WBC/hpf   RBC / HPF 3-6 <3 RBC/hpf   Bacteria, UA FEW (A) RARE   Casts GRANULAR CAST (A) NEGATIVE   Urine-Other YEAST   Glucose, capillary     Status: Abnormal   Collection Time: 08/08/14 12:25 PM  Result Value Ref Range   Glucose-Capillary 162 (H) 70 - 99 mg/dL   Climbing white blood cell count Remains sedated for purposes of ventilation.

## 2014-08-08 NOTE — Plan of Care (Signed)
Problem: Phase II Progression Outcomes Goal: Neuro exam at baseline or improved Outcome: Not Progressing Goal: Extubated and maintains O2 sats > 92% Outcome: Not Progressing Goal: Hemodynamically stable Outcome: Progressing

## 2014-08-08 NOTE — Progress Notes (Signed)
PULMONARY / CRITICAL CARE MEDICINE   Name: Stephanie SilvasVicky D Haliburton MRN: [id number] DOB: Jun 14, 1951    ADMISSION DATE:  07/26/2014 CONSULTATION DATE:  07/26/2014  REFERRING MD :  Dr. Franky Machoabbell  CHIEF COMPLAINT:  Headache  INITIAL PRESENTATION: 63 year old female with no known medical history [facility]/2 with severe headache, found to have SAH. AMS in ED requiring intubation. [facility]. PCCM consulted for vent and ICU management.  STUDIES:  11/2 CT head > diffuse SAH, frontal and temporal bilaterally.  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 Status post anterior communicating artery aneurysm clipping; frontal lobe edema stable, small volume intraventricular hemorrhage stable, regression of SAH, no new intracranial abnormality  SIGNIFICANT EVENTS: 11/2[facility], Intubated for airway protection, [facility] 11/4- Right pterional craniotomy for clipping of anterior communicating artery aneurysm, complex 11/5 - no cuff leak >> started steroids 11/7 not weaning , lowest ps was 12 11/12 trach > 11/14 hgb 6.6, no active sign of bleeding 11/15 worsening renal failure, vasopressin and neo added for BP goals  SUBJECTIVE:   worsening renal failure, vasopressin and neo added for BP goals; nurse notes yellow secretions from mouth ABG obtained due to worsening respiratory status  VITAL SIGNS: Temp:  [98 F (36.7 C)-99 F (37.2 C)] 98.2 F (36.8 C) (11/15 0800) Pulse Rate:  [92-115] 104 (11/15 0251) Resp:  [13-31] 31 (11/15 0251) BP: (156-202)/(54-96) 170/66 mmHg (11/15 0251) SpO2:  [98 %-100 %] 100 % (11/15 0251) FiO2 (%):  [40 %] 40 % (11/15 0834) Weight:  [78.7  kg (173 lb 8 oz)] 78.7 kg (173 lb 8 oz) (11/15 0429)   HEMODYNAMICS: CVP:  [23 mmHg-29 mmHg] 26 mmHg  VENTILATOR SETTINGS: Vent Mode:  [-] PRVC FiO2 (%):  [40 %] 40 % Set Rate:  [20 bmp-30 bmp] 30 bmp Vt Set:  [370 mL] 370 mL PEEP:  [5 cmH20] 5 cmH20 Plateau Pressure:  [30 cmH20-45 cmH20] 45 cmH20  INTAKE / OUTPUT:  Intake/Output Summary (Last 24 hours) at 08/08/14 0912 Last data filed at 08/08/14 0849  Gross per 24 hour  Intake 6681.15 ml  Output   1465 ml  Net 5216.15 ml    PHYSICAL EXAMINATION:  Gen: on vent HEENT: scalp incision c/d/i, trach in place PULM: crackles/rhonchi bilaterally CV: Tachy regular, no mgr Ab: BS+, soft Ext: massive edema/anasarca Derm: no breakdown Neuro: winces to pain with touch left chest,   LABS:  CBC  Recent Labs Lab 08/06/14 0606 08/07/14 0450 08/08/14 0208  WBC 22.5* 25.3* 28.3*  HGB 7.3* 6.6* 7.6*  HCT 23.0* 21.2* 23.9*  PLT 388 416* 390   Coag's  Recent Labs Lab 08/05/14 0512  APTT 31  INR 1.27   BMET  Recent Labs Lab 08/06/14 0606 08/07/14 0450 08/08/14 0208  NA 137 141 135*  K 3.9 3.9 3.9  CL 103 107 103  CO2 19 21 18*  BUN 16 17 24*  CREATININE 1.17* 1.03 1.42*  GLUCOSE 149* 217* 152*   Electrolytes  Recent Labs Lab 08/05/14 0512 08/06/14 0606 08/07/14 0450 08/08/14 0208  CALCIUM 8.6 8.9 8.9 8.7  MG 2.0 1.9 2.0  --   PHOS 2.3 3.2 3.1  --  Sepsis Markers No results for input(s): LATICACIDVEN, PROCALCITON, O2SATVEN in the last 168 hours.   ABG  Recent Labs Lab 08/07/14 1928 08/07/14 2238 08/08/14 0226  PHART 7.174* 7.162* 7.367  PCO2ART 55.8* 56.8* 27.1*  PO2ART 102.0* 91.0 28.0*   Cardiac Enzymes No results for input(s): TROPONINI, PROBNP in the last 168 hours. Glucose  Recent Labs Lab 08/07/14 0801 08/07/14 1142 08/07/14 1626 08/07/14 2013 08/07/14 2348 08/08/14 0415  GLUCAP 176* 215* 199* 172* 157* 136*    Imaging No results found.  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 Fluid per neurosurgery RASS goal: 0. Fentanyl gtt for analgesia/sedation. Nimodipine for spasm. Started statin 11/4.  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 Concern for upper airway edema s/p tracheostomy 11/12 P:   Full vent support, high RR strategy Repeat ABG stat and adjust PEEP/FiO2 for PaO2 Hold lasix 11/15 given worsening renal failure Treat for HCAP, see ID STAT CXR PRN BD's  CARDIOVASCULAR Rt IJ CVL 11/03 >> A:  [facility] Concern for sepsis given rapidly increasing pressor needs P:  CVP monitoring Levophed for SBP goal 180-200 Place a-line Nimodipine Check 12 lead, troponin  RENAL  Recent Labs Lab 08/06/14 0606 08/07/14 0450 08/08/14 0208  NA 137 141 135*    Recent Labs Lab 08/06/14 0606 08/07/14 0450 08/08/14 0208  K 3.9 3.9 3.9   A:   Mild hypernatremia > resolved AKI > 11/15 uncertain etiology  P:   Urinalysis, urine cr/na Replete electrolytes as indicated. Albumin per NSGY Continue IVF  GASTROINTESTINAL A:   Nutrition P:   Tube feeds while on vent > 11/15 held due to oral secretions, restart later in day SUP: pepcid  HEMATOLOGIC A:   Anemia of critical illness > 11/14 Hgb 6.6 without evidence of bleeding; Transfused 1 U PRBC 11/14 Leukocytosis. P:  F/u CBC SCD for DVT prevention  INFECTIOUS A:   Sepsis 11/04 > no clear source, treated with broad spectrum  Abx 11/15 sepsis again? Rising WBC P:    Blood 11/02 >>NTD Blood 11/03 >>NTD Blood 11/04 >>NTD C diff 11/06 >> neg Sputum 11/2>>neg UC 11-2>>neg ==== Blood 11/15 >> Urine 11/15 >> Resp 11/15 >>  Pull Central line (312 days old), maintain PICC Restart anbitiocs Vanc/Imipenem 11/15 >  Pan culture  ENDOCRINE A:   No acute issues P:   SSI while on tube feeds  FAMILY  - Updates: No family bedside  TODAY'S SUMMARY:  I'm very concerned about her 11/15, rising pressor needs, rising WBC, worsening AKI.  Not sure what is going on here, sepsis? Pull old CVL, check for ischemia with 12 lead, troponin. Add back empiric antibiotics  CC time 45 min.  Heber CarolinaBrent McQuaid, MD [place] [facility] Pager: [phone] Cell: [phone] If no response, call [phone]

## 2014-08-08 NOTE — Progress Notes (Addendum)
ANTIBIOTIC CONSULT NOTE - INITIAL  Pharmacy Consult for Vanco/Primaxin  Indication: HCAP/sepsis  No Known Allergies  Patient Measurements: Height: 5' (152.4 cm) Weight: 173 lb 8 oz (78.7 kg) IBW/kg (Calculated) : 45.5 Adjusted Body Weight:    Vital Signs: Temp: 98.2 F (36.8 C) (11/15 0800) Temp Source: Oral (11/15 0800) BP: 170/66 mmHg (11/15 0251) Pulse Rate: 104 (11/15 0251) Intake/Output from previous day: 11/14 0701 - 11/15 0700 In: 7191.2 [I.V.:4566.2; Blood:330; NG/GT:1035; IV Piggyback:1200] Out: 1690 [Urine:1690] Intake/Output from this shift: Total I/O In: 100 [IV Piggyback:100] Out: 35 [Urine:35]  Labs:  Recent Labs  08/06/14 0606 08/07/14 0450 08/08/14 0208  WBC 22.5* 25.3* 28.3*  HGB 7.3* 6.6* 7.6*  PLT 388 416* 390  CREATININE 1.17* 1.03 1.42*   Estimated Creatinine Clearance: 36.7 mL/min (by C-G formula based on Cr of 1.42). No results for input(s): VANCOTROUGH, VANCOPEAK, VANCORANDOM, GENTTROUGH, GENTPEAK, GENTRANDOM, TOBRATROUGH, TOBRAPEAK, TOBRARND, AMIKACINPEAK, AMIKACINTROU, AMIKACIN in the last 72 hours.   Microbiology: Recent Results (from the past 720 hour(s))  MRSA PCR Screening     Status: None   Collection Time: 07/26/14  7:58 PM  Result Value Ref Range Status   MRSA by PCR NEGATIVE NEGATIVE Final    Comment:        The GeneXpert MRSA Assay (FDA approved for NASAL specimens only), is one component of a comprehensive MRSA colonization surveillance program. It is not intended to diagnose MRSA infection nor to guide or monitor treatment for MRSA infections.   Culture, Urine     Status: None   Collection Time: 07/26/14 11:12 PM  Result Value Ref Range Status   Specimen Description URINE, CATHETERIZED  Final   Special Requests NONE  Final   Culture  Setup Time   Final    07/27/2014 10:07 Performed at [facility]    Colony Count NO GROWTH Performed at [facility]   Final   Culture NO GROWTH Performed at  [facility]   Final   Report Status 07/28/2014 FINAL  Final  Culture, blood (routine x 2)     Status: None   Collection Time: 07/26/14 11:49 PM  Result Value Ref Range Status   Specimen Description BLOOD LEFT ARM  Final   Special Requests BOTTLES DRAWN AEROBIC ONLY 5CC  Final   Culture  Setup Time   Final    07/27/2014 08:58 Performed at [facility]    Culture   Final    NO GROWTH 5 DAYS Performed at [facility]    Report Status 08/02/2014 FINAL  Final  Culture, respiratory (NON-Expectorated)     Status: None   Collection Time: 07/26/14 11:52 PM  Result Value Ref Range Status   Specimen Description TRACHEAL ASPIRATE  Final   Special Requests NONE  Final   Gram Stain   Final    RARE WBC PRESENT, PREDOMINANTLY PMN RARE SQUAMOUS EPITHELIAL CELLS PRESENT RARE GRAM POSITIVE COCCI IN PAIRS Performed at [facility]    Culture   Final    Non-Pathogenic Oropharyngeal-type Flora Isolated. Performed at [facility]    Report Status 07/29/2014 FINAL  Final  Culture, blood (routine x 2)     Status: None   Collection Time: 07/27/14 12:04 AM  Result Value Ref Range Status   Specimen Description BLOOD LEFT HAND  Final   Special Requests BOTTLES DRAWN AEROBIC ONLY 4CC  Final   Culture  Setup Time   Final    07/27/2014 08:58 Performed at [facility]    Culture   Final    NO GROWTH 5 DAYS Performed at [facility]    Report Status 08/02/2014 FINAL  Final  Culture, respiratory (NON-Expectorated)     Status: None   Collection Time: 07/28/14  8:49 AM  Result Value Ref Range Status   Specimen Description TRACHEAL ASPIRATE  Final   Special Requests Normal  Final   Gram Stain   Final    FEW WBC PRESENT,BOTH PMN AND MONONUCLEAR NO SQUAMOUS EPITHELIAL CELLS SEEN NO ORGANISMS SEEN Performed at [facility]    Culture   Final    Non-Pathogenic Oropharyngeal-type Flora Isolated. Performed at [facility]     Report Status 07/30/2014 FINAL  Final  Culture, blood (routine x 2)     Status: None   Collection Time: 07/28/14  9:43 AM  Result Value Ref Range Status   Specimen Description BLOOD LEFT HAND  Final   Special Requests BOTTLES DRAWN AEROBIC ONLY 10CC  Final   Culture  Setup Time   Final    07/28/2014 15:42 Performed at [facility]    Culture   Final    NO GROWTH 5 DAYS Performed at [facility]    Report Status 08/03/2014 FINAL  Final  Culture, blood (routine x 2)     Status: None   Collection Time: 07/28/14 10:00 AM  Result Value Ref Range Status   Specimen Description BLOOD LEFT HAND  Final   Special Requests BOTTLES DRAWN AEROBIC ONLY 8CC  Final   Culture  Setup Time   Final    07/28/2014 15:42 Performed at [facility]    Culture   Final    NO GROWTH 5 DAYS Performed at [facility]    Report Status 08/03/2014 FINAL  Final  Clostridium Difficile by PCR     Status: None   Collection Time: 07/30/14  8:06 AM  Result Value Ref Range Status   C difficile by pcr NEGATIVE NEGATIVE Final    Medical History: Past Medical History  Diagnosis Date  . COPD (chronic obstructive pulmonary disease)      Assessment: [age] YOF brought in by EMS after co-workers called for a severe headache and mental status changes. She vomited in ambulance and aspiration suspected. [facility] but was transferred here after a SAH was discovered. SCr 1.05 per [facility], est CrCl ~2340mL/min  PMH: COPD  AC: none PTA, SCDs for VTE proph  ID: Resuming Vanc/zosyn for PNA/sepsis.Tmax 99. WBC up to 28.3. Scr up to 1.42 this AM. Having yellow respiratory secretions and worsening ABG.  Unasyn 11/2>>11/4 Vanc 11/4>>11/11 Zosyn 11/4>>11/11, 11/15>> Primaxin 11/15>>  MRSA pcr (-) 11/06 cdiff>> neg  11/2 TA>> normal flora 11/04 TA>> neg  11/06 cdigg> neg  11/04 bcx x2>> neg 11/02 ucx >> neg  11/02 bcx>> neg 11/03 bcx x2>  neg   Goal of Therapy:  Vancomycin trough level 15-20 mcg/ml  Plan:  Primaxin 250mg  IV q6hr. Monitor for seizures. Vancomycin 1g IV q24h. Trough after 3-5 doses at steady state.    Tauheedah Bok S. Merilynn Finlandobertson, PharmD, BCPS Clinical Staff Pharmacist Pager [phone]  Misty Stanleyobertson, [place] Murthy Stillinger 08/08/2014,9:22 AM

## 2014-08-08 NOTE — Progress Notes (Signed)
Temperature for pt's 0200am ABG was entered incorrectly (68.6 F vs 98.6). Values at 98.6 are as follows: 7.149, PaC0256.3, Pa0283, HC03 19.6.

## 2014-08-08 NOTE — Progress Notes (Signed)
Foley cath noted to have high amt of sediment in cath. No urine output for two hrs. MD notified and reviewed UA results. Ordered to replace current foley.

## 2014-08-09 ENCOUNTER — Inpatient Hospital Stay (HOSPITAL_COMMUNITY): Payer: 59

## 2014-08-09 LAB — BASIC METABOLIC PANEL
ANION GAP: 16 — AB (ref 5–15)
BUN: 19 mg/dL (ref 6–23)
CALCIUM: 8.3 mg/dL — AB (ref 8.4–10.5)
CO2: 16 mEq/L — ABNORMAL LOW (ref 19–32)
CREATININE: 0.98 mg/dL (ref 0.50–1.10)
Chloride: 114 mEq/L — ABNORMAL HIGH (ref 96–112)
GFR calc Af Amer: 69 mL/min — ABNORMAL LOW (ref >90)
GFR, EST NON AFRICAN AMERICAN: 59 mL/min — AB (ref >90)
Glucose, Bld: 229 mg/dL — ABNORMAL HIGH (ref 70–99)
Potassium: 3.3 mEq/L — ABNORMAL LOW (ref 3.7–5.3)
Sodium: 146 mEq/L (ref 137–147)

## 2014-08-09 LAB — GLUCOSE, CAPILLARY
GLUCOSE-CAPILLARY: 112 mg/dL — AB (ref 70–99)
GLUCOSE-CAPILLARY: 113 mg/dL — AB (ref 70–99)
GLUCOSE-CAPILLARY: 134 mg/dL — AB (ref 70–99)
GLUCOSE-CAPILLARY: 158 mg/dL — AB (ref 70–99)
Glucose-Capillary: 133 mg/dL — ABNORMAL HIGH (ref 70–99)
Glucose-Capillary: 131 mg/dL — ABNORMAL HIGH (ref 70–99)

## 2014-08-09 MED ORDER — METOCLOPRAMIDE HCL 5 MG/ML IJ SOLN
10.0000 mg | Freq: Three times a day (TID) | INTRAMUSCULAR | Status: DC
  Administered 2014-08-09 – 2014-08-10 (×5): 10 mg via INTRAVENOUS
  Filled 2014-08-09 (×6): qty 2

## 2014-08-09 MED ORDER — VITAL HIGH PROTEIN PO LIQD
1000.0000 mL | ORAL | Status: DC
  Administered 2014-08-09: 1000 mL
  Filled 2014-08-09 (×3): qty 1000

## 2014-08-09 MED ORDER — SODIUM CHLORIDE 0.9 % IV SOLN
500.0000 mg | Freq: Three times a day (TID) | INTRAVENOUS | Status: DC
  Administered 2014-08-09 – 2014-08-13 (×13): 500 mg via INTRAVENOUS
  Filled 2014-08-09 (×15): qty 500

## 2014-08-09 MED ORDER — POTASSIUM CHLORIDE 20 MEQ/15ML (10%) PO SOLN
10.0000 meq | ORAL | Status: AC
  Administered 2014-08-09 (×2): 10 meq
  Filled 2014-08-09 (×2): qty 15

## 2014-08-09 MED ORDER — FLUCONAZOLE IN SODIUM CHLORIDE 400-0.9 MG/200ML-% IV SOLN
400.0000 mg | INTRAVENOUS | Status: DC
  Administered 2014-08-10 – 2014-08-16 (×7): 400 mg via INTRAVENOUS
  Filled 2014-08-09 (×8): qty 200

## 2014-08-09 MED ORDER — LEVETIRACETAM 100 MG/ML PO SOLN
500.0000 mg | Freq: Two times a day (BID) | ORAL | Status: DC
  Administered 2014-08-09 – 2014-08-26 (×34): 500 mg
  Filled 2014-08-09 (×35): qty 5

## 2014-08-09 MED ORDER — IOHEXOL 350 MG/ML SOLN
50.0000 mL | Freq: Once | INTRAVENOUS | Status: AC | PRN
  Administered 2014-08-09: 50 mL via INTRAVENOUS

## 2014-08-09 MED ORDER — VANCOMYCIN HCL IN DEXTROSE 750-5 MG/150ML-% IV SOLN
750.0000 mg | Freq: Two times a day (BID) | INTRAVENOUS | Status: DC
  Administered 2014-08-09 – 2014-08-11 (×4): 750 mg via INTRAVENOUS
  Filled 2014-08-09 (×7): qty 150

## 2014-08-09 MED ORDER — FLUCONAZOLE IN SODIUM CHLORIDE 400-0.9 MG/200ML-% IV SOLN
800.0000 mg | Freq: Once | INTRAVENOUS | Status: AC
  Administered 2014-08-09: 800 mg via INTRAVENOUS
  Filled 2014-08-09: qty 400

## 2014-08-09 NOTE — Progress Notes (Signed)
SLP Cancellation Note  Patient Details Name: Stephanie Doyle MRN: [id number] DOB: 09/04/1951   Cancelled treatment:       Reason Eval/Treat Not Completed: Patient not medically ready   Kymiah Araiza, [name] 08/09/2014, 7:10 AM

## 2014-08-09 NOTE — Progress Notes (Signed)
[facility] ICU Electrolyte Replacement Protocol  Patient Name: Stephanie Doyle DOB: Dec 12, 1950 MRN: [id number]  Date of Service  08/09/2014   HPI/Events of Note    Recent Labs Lab 08/03/14 0555 08/04/14 0430 08/05/14 0512 08/06/14 0606 08/07/14 0450 08/08/14 0208 08/08/14 1719 08/09/14 0254  NA 140 141 140 137 141 135* 140 146  K 3.6* 3.2* 4.0 3.9 3.9 3.9 3.6* 3.3*  CL 107 106 105 103 107 103 108 114*  CO2 23 24 23 19 21  18* 16* 16*  GLUCOSE 154* 181* 138* 149* 217* 152* 104* 229*  BUN 8 10 14 16 17  24* 25* 19  CREATININE 0.60 0.65 0.73 1.17* 1.03 1.42* 1.47* 0.98  CALCIUM 8.4 8.3* 8.6 8.9 8.9 8.7 8.8 8.3*  MG 1.8 1.8 2.0 1.9 2.0  --   --   --   PHOS 2.1* 1.8* 2.3 3.2 3.1  --   --   --     Estimated Creatinine Clearance: 51.6 mL/min (by C-G formula based on Cr of 0.98).  Intake/Output      11/15 0701 - 11/16 0700   I.V. (mL/kg) 3554.3 (47.7)   IV Piggyback 1550   Total Intake(mL/kg) 5104.3 (68.5)   Urine (mL/kg/hr) 6620 (3.7)   Drains 400 (0.2)   Total Output 7020   Net -1915.7        - I/O DETAILED x24h    Total I/O In: 2235.2 [I.V.:1685.2; IV Piggyback:550] Out: 1798 [VSYVG:8628] - I/O THIS SHIFT    ASSESSMENT   eICURN Interventions  ICU Electrolyte Replacement Protocol criteria met. Labs Replaced per protocol. MD notified   ASSESSMENT: MAJOR ELECTROLYTE    Stephanie Doyle 08/09/2014, 5:27 AM

## 2014-08-09 NOTE — Progress Notes (Signed)
PULMONARY / CRITICAL CARE MEDICINE   Name: Stephanie Doyle MRN: [id number] DOB: Aug 21, 1951    ADMISSION DATE:  07/26/2014 CONSULTATION DATE:  07/26/2014  REFERRING MD :  Dr. Franky Macho  CHIEF COMPLAINT:  Headache  INITIAL PRESENTATION: 63 year old female with no known medical history presented to [facility] 11/2 with severe headache, found to have SAH. AMS in ED requiring intubation. She was transferred to [facility] for further eval. PCCM consulted for vent and ICU management.  STUDIES:  11/2 CT head > diffuse SAH, frontal and temporal bilaterally.  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 Status post anterior communicating artery aneurysm clipping; frontal lobe edema stable, small volume intraventricular hemorrhage stable, regression of SAH, no new intracranial abnormality  SIGNIFICANT EVENTS: 11/2[facility] Intub[facility] for airway protection, transferred to [facility] NICU 11/4- Right pterional craniotomy for clipping of anterior communicating artery aneurysm, complex 11/5 - no cuff leak >> started steroids 11/7 not weaning , lowest ps was 12 11/12 trach > 11/14 hgb 6.6, no active sign of bleeding 11/15 worsening renal failure, vasopressin and neo added for BP goals  SUBJECTIVE:   Afebrile Excellent UO   VITAL SIGNS: Temp:  [97.3 F (36.3 C)-98.4 F (36.9 C)] 97.7 F (36.5 C) (11/16 0800) Pulse Rate:  [88-109] 105 (11/16 0800) Resp:  [0-30] 20 (11/16 0800) BP: (94-163)/(47-113) 132/61 mmHg (11/16 0800) SpO2:  [99 %-100 %] 100 % (11/16 0800) FiO2 (%):  [40 %] 40 % (11/16 0800) Weight:  [74.5 kg (164 lb 3.9 oz)] 74.5 kg (164 lb 3.9 oz) (11/16 0351)   HEMODYNAMICS: CVP:  [24 mmHg-33 mmHg] 24 mmHg  VENTILATOR  SETTINGS: Vent Mode:  [-] PRVC FiO2 (%):  [40 %] 40 % Set Rate:  [30 bmp] 30 bmp Vt Set:  [370 mL] 370 mL PEEP:  [5 cmH20] 5 cmH20 Plateau Pressure:  [22 cmH20-50 cmH20] 34 cmH20  INTAKE / OUTPUT:  Intake/Output Summary (Last 24 hours) at 08/09/14 0849 Last data filed at 08/09/14 0845  Gross per 24 hour  Intake 5630.08 ml  Output   6985 ml  Net -1354.92 ml    PHYSICAL EXAMINATION:  Gen: on vent HEENT: scalp incision c/d/i, trach in place PULM: decreased BS, crackles bilaterally CV: Tachy regular, no mgr Ab: BS+, soft Ext: massive edema/anasarca Derm: no breakdown Neuro: winces to pain with touch left chest,   LABS:  CBC  Recent Labs Lab 08/07/14 0450 08/08/14 0208 08/08/14 0938  WBC 25.3* 28.3* 27.4*  HGB 6.6* 7.6* 7.4*  HCT 21.2* 23.9* 22.9*  PLT 416* 390 349   Coag's  Recent Labs Lab 08/05/14 0512  APTT 31  INR 1.27   BMET  Recent Labs Lab 08/08/14 0208 08/08/14 1719 08/09/14 0254  NA 135* 140 146  K 3.9 3.6* 3.3*  CL 103 108 114*  CO2 18* 16* 16*  BUN 24* 25* 19  CREATININE 1.42* 1.47* 0.98  GLUCOSE 152* 104* 229*   Electrolytes  Recent Labs Lab 08/05/14 0512 08/06/14 0606 08/07/14 0450 08/08/14 0208 08/08/14 1719 08/09/14 0254  CALCIUM 8.6 8.9 8.9 8.7 8.8 8.3*  MG 2.0 1.9 2.0  --   --   --   PHOS 2.3 3.2 3.1  --   --   --  Sepsis Markers No results for input(s): LATICACIDVEN, PROCALCITON, O2SATVEN in the last 168 hours.   ABG  Recent Labs Lab 08/07/14 2238 08/08/14 0226 08/08/14 1534  PHART 7.162* 7.367 7.222*  PCO2ART 56.8* 27.1* 40.8  PO2ART 91.0 28.0* 128.0*   Cardiac Enzymes  Recent Labs Lab 08/08/14 0938  TROPONINI <0.30   Glucose  Recent Labs Lab 08/08/14 1225 08/08/14 1710 08/08/14 2041 08/08/14 2336 08/09/14 0336 08/09/14 0810  GLUCAP 162* 112* 94 113* 133* 158*    Imaging Dg Chest Port 1 View  08/08/2014   CLINICAL DATA:  Acute respiratory failure and hypoxia  EXAM: PORTABLE CHEST - 1  VIEW  COMPARISON:  08/06/2014 and prior studies  FINDINGS: A right IJ central venous catheter with tip overlying the mid SVC, a right PICC line with tip overlying the lower SVC, and tracheostomy tube again noted.  Bilateral lower lung atelectasis/ airspace disease is slightly improved.  Decreased pulmonary vascular congestion/ mild edema noted.  There is no evidence of pneumothorax.  No other changes identified.  IMPRESSION: Decreased pulmonary vascular congestion and interstitial edema with improved aeration of the lung bases.   Electronically Signed   By: Laveda AbbeJeff  Hu M.D.   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 Fentanyl gtt for analgesia/sedation. Nimodipine for spasm. Started statin 11/4.  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 Concern for upper airway edema s/p tracheostomy 11/12 P:   Defer SBTs for now Treat for HCAP, see ID   CARDIOVASCULAR Rt IJ CVL 11/03 >> A:  [facility] Concern for sepsis given rapidly increasing pressor needs P:  CVP monitoring Levophed for MAP goal 65 Nimodipine  RENAL  Recent Labs Lab 08/08/14 0208 08/08/14 1719 08/09/14 0254  NA 135* 140 146    Recent Labs Lab 08/08/14 0208 08/08/14 1719 08/09/14 0254  K 3.9 3.6* 3.3*   A:   Mild hypernatremia > resolved AKI > 11/15 uncertain etiology  P:    Replete electrolytes as indicated. Dc Albumin Dc  IVF  GASTROINTESTINAL A:    Nutrition P:   Tube feeds while on vent > 11/15 held due to oral secretions, restart 11/16 low dose SUP: pepcid  HEMATOLOGIC A:   Anemia of critical illness > 11/14 Hgb 6.6 without evidence of bleeding; Transfused 1 U PRBC 11/14 Leukocytosis. P:  F/u CBC SCD for DVT prevention  INFECTIOUS A:   Sepsis 11/04 > no clear source, treated with broad spectrum Abx 11/15 sepsis again? Rising WBC P:    Blood 11/02 >>NTD Blood 11/03 >>NTD Blood 11/04 >>NTD C diff 11/06 >> neg Sputum 11/2>>neg UC 11-2>>neg ==== Blood 11/15 >> Urine 11/15 >> Resp 11/15 >>  Pull Central line (4712 days old), maintain PICC Restart anbitiocs Vanc/Imipenem 11/15 >  Await Pan culture data   ENDOCRINE A:   No acute issues P:   SSI while on tube feeds  FAMILY  -  Updates: No family bedside  TODAY'S SUMMARY: Concernf or sepsis, beyond danger period for vasospasm, can change MAP goal 65 & try to diurese eventually once off pressors   The patient is critically ill with multiple organ systems failure and requires high complexity decision making for assessment and support, frequent evaluation and titration of therapies, application of advanced monitoring technologies and extensive interpretation of multiple databases. Critical Care Time devoted to patient care services described in this note is 35 minutes.    Cyril Mourningakesh Destani Wamser MD. Tonny BollmanFCCP. [place] [facility] Pager [phone] If no response call [phone]

## 2014-08-09 NOTE — Progress Notes (Addendum)
ANTIBIOTIC CONSULT NOTE - follow up Pharmacy Consult for Vanco/Primaxin  Indication: HCAP/sepsis  No Known Allergies  Patient Measurements: Height: 5' (152.4 cm) Weight: 164 lb 3.9 oz (74.5 kg) IBW/kg (Calculated) : 45.5      Vital Signs: Temp: 97.7 F (36.5 C) (11/16 0900) BP: 108/69 mmHg (11/16 0900) Pulse Rate: 104 (11/16 0900) Intake/Output from previous day: 11/15 0701 - 11/16 0700 In: 5546 [I.V.:3746; IV Piggyback:1800] Out: 7020 [Urine:6620; Drains:400] Intake/Output from this shift: Total I/O In: 368 [I.V.:268; IV Piggyback:100] Out: -   Labs:  Recent Labs  08/07/14 0450 08/08/14 0208 08/08/14 0938 08/08/14 1028 08/08/14 1719 08/09/14 0254  WBC 25.3* 28.3* 27.4*  --   --   --   HGB 6.6* 7.6* 7.4*  --   --   --   PLT 416* 390 349  --   --   --   LABCREA  --   --   --  25.53  --   --   CREATININE 1.03 1.42*  --   --  1.47* 0.98   Estimated Creatinine Clearance: 51.6 mL/min (by C-G formula based on Cr of 0.98). No results for input(s): VANCOTROUGH, VANCOPEAK, VANCORANDOM, GENTTROUGH, GENTPEAK, GENTRANDOM, TOBRATROUGH, TOBRAPEAK, TOBRARND, AMIKACINPEAK, AMIKACINTROU, AMIKACIN in the last 72 hours.   Microbiology: Recent Results (from the past 720 hour(s))  MRSA PCR Screening     Status: None   Collection Time: 07/26/14  7:58 PM  Result Value Ref Range Status   MRSA by PCR NEGATIVE NEGATIVE Final    Comment:        The GeneXpert MRSA Assay (FDA approved for NASAL specimens only), is one component of a comprehensive MRSA colonization surveillance program. It is not intended to diagnose MRSA infection nor to guide or monitor treatment for MRSA infections.   Culture, Urine     Status: None   Collection Time: 07/26/14 11:12 PM  Result Value Ref Range Status   Specimen Description URINE, CATHETERIZED  Final   Special Requests NONE  Final   Culture  Setup Time   Final    07/27/2014 10:07 Performed at [facility]    Colony Count NO  GROWTH Performed at [facility]   Final   Culture NO GROWTH Performed at [facility]   Final   Report Status 07/28/2014 FINAL  Final  Culture, blood (routine x 2)     Status: None   Collection Time: 07/26/14 11:49 PM  Result Value Ref Range Status   Specimen Description BLOOD LEFT ARM  Final   Special Requests BOTTLES DRAWN AEROBIC ONLY 5CC  Final   Culture  Setup Time   Final    07/27/2014 08:58 Performed at [facility]    Culture   Final    NO GROWTH 5 DAYS Performed at [facility]    Report Status 08/02/2014 FINAL  Final  Culture, respiratory (NON-Expectorated)     Status: None   Collection Time: 07/26/14 11:52 PM  Result Value Ref Range Status   Specimen Description TRACHEAL ASPIRATE  Final   Special Requests NONE  Final   Gram Stain   Final    RARE WBC PRESENT, PREDOMINANTLY PMN RARE SQUAMOUS EPITHELIAL CELLS PRESENT RARE GRAM POSITIVE COCCI IN PAIRS Performed at [facility]    Culture   Final    Non-Pathogenic Oropharyngeal-type Flora Isolated. Performed at [facility]    Report Status 07/29/2014 FINAL  Final  Culture, blood (routine x 2)  Status: None   Collection Time: 07/27/14 12:04 AM  Result Value Ref Range Status   Specimen Description BLOOD LEFT HAND  Final   Special Requests BOTTLES DRAWN AEROBIC ONLY 4CC  Final   Culture  Setup Time   Final    07/27/2014 08:58 Performed at [facility]    Culture   Final    NO GROWTH 5 DAYS Performed at [facility]    Report Status 08/02/2014 FINAL  Final  Culture, respiratory (NON-Expectorated)     Status: None   Collection Time: 07/28/14  8:49 AM  Result Value Ref Range Status   Specimen Description TRACHEAL ASPIRATE  Final   Special Requests Normal  Final   Gram Stain   Final    FEW WBC PRESENT,BOTH PMN AND MONONUCLEAR NO SQUAMOUS EPITHELIAL CELLS SEEN NO ORGANISMS SEEN Performed at [facility]    Culture   Final     Non-Pathogenic Oropharyngeal-type Flora Isolated. Performed at [facility]    Report Status 07/30/2014 FINAL  Final  Culture, blood (routine x 2)     Status: None   Collection Time: 07/28/14  9:43 AM  Result Value Ref Range Status   Specimen Description BLOOD LEFT HAND  Final   Special Requests BOTTLES DRAWN AEROBIC ONLY 10CC  Final   Culture  Setup Time   Final    07/28/2014 15:42 Performed at [facility]    Culture   Final    NO GROWTH 5 DAYS Performed at [facility]    Report Status 08/03/2014 FINAL  Final  Culture, blood (routine x 2)     Status: None   Collection Time: 07/28/14 10:00 AM  Result Value Ref Range Status   Specimen Description BLOOD LEFT HAND  Final   Special Requests BOTTLES DRAWN AEROBIC ONLY 8CC  Final   Culture  Setup Time   Final    07/28/2014 15:42 Performed at [facility]    Culture   Final    NO GROWTH 5 DAYS Performed at [facility]    Report Status 08/03/2014 FINAL  Final  Clostridium Difficile by PCR     Status: None   Collection Time: 07/30/14  8:06 AM  Result Value Ref Range Status   C difficile by pcr NEGATIVE NEGATIVE Final     Vanc/imipenem day #2 for PNA/sepsis.  Afebrile. WBC 28.3/27.4.  Scr down to 0.98 from 1.47 yesterday. Off vasopressin. On levophed.   Unasyn 11/2>>11/4 Vanc 11/4>>11/11, 11/15>> Zosyn 11/4>>11/11  Primaxin 11/15>>  MRSA pcr (-) 11/06 cdiff>> neg  11/2 TA>> normal flora 11/04 TA>> neg  11/06 cdigg> neg  11/04 bcx x2>> negF 11/02 ucx >> neg  11/02 bcx>> negF 11/03 bcx x2> negF 11/15 TA>> 11/15 BCx2>> 11/15 Ucx>>  Goal of Therapy:  Vancomycin trough level 15-20 mcg/ml  Plan:  Increase imipenem to 500 q8h Change vanc to 750 q12h from Vancomycin 1g IV q24h. Trough after 3-5 doses at steady state. F/u new cx data f/u renal fxn Herby AbrahamMichelle T. Bell, Pharm.D. [phone] 08/09/2014 9:26 AM   Addendum 9:56 PM -MD consulted pharmacy to add Fluconazole for  candidemia. CrCl ~ 50 -55 mL/min.   Plan: Fluconazole 800mg  x1, then 400mg  IV daily.   Link SnufferJessica Denay Pleitez, PharmD, BCPS Clinical Pharmacist [phone] 08/09/2014, 9:59 PM

## 2014-08-09 NOTE — Progress Notes (Signed)
UR completed.  Not medically stable enough for d/c to next level of care regardless of whether it was LTACH or vent SNF.  Continue to follow.   Carlyle LipaMichelle Niamh Rada, RN BSN [facility] CCM Trauma/Neuro ICU Case Manager [phone]

## 2014-08-09 NOTE — Progress Notes (Signed)
Blood cultures in aerobic bottle grew yeast per lab. Dr Bard HerbertSimmonds notified in box.

## 2014-08-09 NOTE — Progress Notes (Signed)
Pt seen and examined. Has required sedation for vent synchrony over the weekend.  EXAM: Temp:  [97.3 F (36.3 C)-98.4 F (36.9 C)] 97.7 F (36.5 C) (11/16 0800) Pulse Rate:  [88-109] 105 (11/16 0800) Resp:  [0-30] 20 (11/16 0800) BP: (94-163)/(47-113) 132/61 mmHg (11/16 0800) SpO2:  [99 %-100 %] 100 % (11/16 0800) FiO2 (%):  [40 %] 40 % (11/16 0800) Weight:  [74.5 kg (164 lb 3.9 oz)] 74.5 kg (164 lb 3.9 oz) (11/16 0351) Intake/Output      11/15 0701 - 11/16 0700 11/16 0701 - 11/17 0700   I.V. (mL/kg) 3746 (50.3) 114.1 (1.5)   Blood     Other     NG/GT     IV Piggyback 1800 100   Total Intake(mL/kg) 5546 (74.4) 214.1 (2.9)   Urine (mL/kg/hr) 6620 (3.7)    Drains 400 (0.2)    Total Output 7020     Net -1474 +214.1          On low-dose propofol, fentanyl: Eyes open spontaneously Breathes over vent ? Tracking Moves RUE purposefully, ? FC Moves RLE spontaneously No significant movement LUE/LLE Wound c/d/i  LABS: Lab Results  Component Value Date   CREATININE 0.98 08/09/2014   BUN 19 08/09/2014   NA 146 08/09/2014   K 3.3* 08/09/2014   CL 114* 08/09/2014   CO2 16* 08/09/2014   Lab Results  Component Value Date   WBC 27.4* 08/08/2014   HGB 7.4* 08/08/2014   HCT 22.9* 08/08/2014   MCV 88.1 08/08/2014   PLT 349 08/08/2014    IMPRESSION: - 63 y.o. female SAH d#14 s/p Acom clipping - Remains neurologically stable  PLAN: - Will get CTA today. If vasospasm is not significant, will start decreasing pressor/fluids and start diuresis per PCCM

## 2014-08-09 NOTE — Progress Notes (Signed)
Wasted 80mL of propofol with Rulon EisenmengerHeather Satterfield, RN.

## 2014-08-09 NOTE — Progress Notes (Signed)
eLink Physician-Brief Progress Note Patient Name: Stephanie SilvasVicky D Sigg DOB: 10-09-1950 MRN: [id number]   Date of Service  08/09/2014  HPI/Events of Note  Fungemia - 1/2 blood cultures from 11/15 positive for yeast. CVL removed 11/15. This is likely line sepsis  eICU Interventions  Fluconazole per Pharm ordered     Intervention Category Major Interventions: Infection - evaluation and management  Billy FischerDavid Muaaz Brau 08/09/2014, 9:55 PM

## 2014-08-09 NOTE — Progress Notes (Signed)
Patient transported to CT and back to 3M04 without complications. Patient tolerated well. Vital signs stable at this time. RT will continue to monitor. RN at bedside.

## 2014-08-10 DIAGNOSIS — B49 Unspecified mycosis: Secondary | ICD-10-CM

## 2014-08-10 DIAGNOSIS — J80 Acute respiratory distress syndrome: Secondary | ICD-10-CM

## 2014-08-10 DIAGNOSIS — D72829 Elevated white blood cell count, unspecified: Secondary | ICD-10-CM

## 2014-08-10 LAB — BASIC METABOLIC PANEL
Anion gap: 12 (ref 5–15)
Anion gap: 10 (ref 5–15)
Anion gap: 11 (ref 5–15)
BUN: 15 mg/dL (ref 6–23)
BUN: 14 mg/dL (ref 6–23)
BUN: 14 mg/dL (ref 6–23)
CHLORIDE: 125 meq/L — AB (ref 96–112)
CHLORIDE: 126 meq/L — AB (ref 96–112)
CO2: 20 mEq/L (ref 19–32)
CO2: 20 mEq/L (ref 19–32)
CO2: 20 mEq/L (ref 19–32)
Calcium: 9.4 mg/dL (ref 8.4–10.5)
Calcium: 9.4 mg/dL (ref 8.4–10.5)
Calcium: 9.5 mg/dL (ref 8.4–10.5)
Chloride: 129 mEq/L — ABNORMAL HIGH (ref 96–112)
Creatinine, Ser: 0.98 mg/dL (ref 0.50–1.10)
Creatinine, Ser: 0.93 mg/dL (ref 0.50–1.10)
Creatinine, Ser: 0.93 mg/dL (ref 0.50–1.10)
GFR calc Af Amer: 73 mL/min — ABNORMAL LOW (ref >90)
GFR calc Af Amer: 73 mL/min — ABNORMAL LOW (ref >90)
GFR, EST AFRICAN AMERICAN: 69 mL/min — AB (ref >90)
GFR, EST NON AFRICAN AMERICAN: 59 mL/min — AB (ref >90)
GFR, EST NON AFRICAN AMERICAN: 63 mL/min — AB (ref >90)
GFR, EST NON AFRICAN AMERICAN: 63 mL/min — AB (ref >90)
Glucose, Bld: 115 mg/dL — ABNORMAL HIGH (ref 70–99)
Glucose, Bld: 106 mg/dL — ABNORMAL HIGH (ref 70–99)
Glucose, Bld: 150 mg/dL — ABNORMAL HIGH (ref 70–99)
POTASSIUM: 2.9 meq/L — AB (ref 3.7–5.3)
POTASSIUM: 4.2 meq/L (ref 3.7–5.3)
POTASSIUM: 4 meq/L (ref 3.7–5.3)
SODIUM: 159 meq/L — AB (ref 137–147)
Sodium: 157 mEq/L — ABNORMAL HIGH (ref 137–147)
Sodium: 157 mEq/L — ABNORMAL HIGH (ref 137–147)

## 2014-08-10 LAB — URINE CULTURE: Colony Count: >=100000

## 2014-08-10 LAB — CBC
HCT: 20.7 % — ABNORMAL LOW (ref 36.0–46.0)
HCT: 26 % — ABNORMAL LOW (ref 36.0–46.0)
HEMOGLOBIN: 6.7 g/dL — AB (ref 12.0–15.0)
HEMOGLOBIN: 8.3 g/dL — AB (ref 12.0–15.0)
MCH: 28.6 pg (ref 26.0–34.0)
MCH: 27.6 pg (ref 26.0–34.0)
MCHC: 32.4 g/dL (ref 30.0–36.0)
MCHC: 31.9 g/dL (ref 30.0–36.0)
MCV: 88.5 fL (ref 78.0–100.0)
MCV: 86.4 fL (ref 78.0–100.0)
PLATELETS: 366 10*3/uL (ref 150–400)
Platelets: 387 10*3/uL (ref 150–400)
RBC: 2.34 MIL/uL — ABNORMAL LOW (ref 3.87–5.11)
RBC: 3.01 MIL/uL — ABNORMAL LOW (ref 3.87–5.11)
RDW: 16.4 % — ABNORMAL HIGH (ref 11.5–15.5)
RDW: 15.7 % — ABNORMAL HIGH (ref 11.5–15.5)
WBC: 10.4 10*3/uL (ref 4.0–10.5)
WBC: 13 10*3/uL — ABNORMAL HIGH (ref 4.0–10.5)

## 2014-08-10 LAB — GLUCOSE, CAPILLARY
GLUCOSE-CAPILLARY: 103 mg/dL — AB (ref 70–99)
GLUCOSE-CAPILLARY: 114 mg/dL — AB (ref 70–99)
GLUCOSE-CAPILLARY: 135 mg/dL — AB (ref 70–99)
GLUCOSE-CAPILLARY: 142 mg/dL — AB (ref 70–99)
Glucose-Capillary: 143 mg/dL — ABNORMAL HIGH (ref 70–99)
Glucose-Capillary: 140 mg/dL — ABNORMAL HIGH (ref 70–99)

## 2014-08-10 LAB — PREPARE RBC (CROSSMATCH)

## 2014-08-10 MED ORDER — NIMODIPINE 60 MG/20ML PO SOLN
60.0000 mg | ORAL | Status: AC
  Administered 2014-08-10 – 2014-08-16 (×36): 60 mg via ORAL
  Filled 2014-08-10 (×36): qty 20

## 2014-08-10 MED ORDER — VITAL HIGH PROTEIN PO LIQD
1000.0000 mL | ORAL | Status: DC
  Administered 2014-08-10 – 2014-08-11 (×2): 1000 mL
  Filled 2014-08-10 (×3): qty 1000

## 2014-08-10 MED ORDER — METOCLOPRAMIDE HCL 5 MG/ML IJ SOLN
5.0000 mg | Freq: Two times a day (BID) | INTRAMUSCULAR | Status: AC
  Administered 2014-08-10 – 2014-08-12 (×4): 5 mg via INTRAVENOUS
  Filled 2014-08-10 (×4): qty 1

## 2014-08-10 MED ORDER — NIMODIPINE 60 MG/20ML PO SOLN: 60.0000 mg | ORAL | Status: DC

## 2014-08-10 MED ORDER — SODIUM CHLORIDE 0.9 % IV SOLN
Freq: Once | INTRAVENOUS | Status: AC
  Administered 2014-08-10: 06:00:00 via INTRAVENOUS

## 2014-08-10 MED ORDER — NIMODIPINE 30 MG/ML ORAL SOLUTION
60.0000 mg | ORAL | Status: DC
  Administered 2014-08-10 (×2): 60 mg
  Filled 2014-08-10 (×13): qty 2

## 2014-08-10 MED ORDER — MICAFUNGIN SODIUM 50 MG IV SOLR: 100.0000 mg | Freq: Every day | INTRAVENOUS | Status: DC

## 2014-08-10 MED ORDER — POTASSIUM CHLORIDE 10 MEQ/50ML IV SOLN
10.0000 meq | INTRAVENOUS | Status: DC
  Administered 2014-08-10 (×2): 10 meq via INTRAVENOUS
  Filled 2014-08-10 (×3): qty 50

## 2014-08-10 MED ORDER — POTASSIUM CHLORIDE 20 MEQ/15ML (10%) PO SOLN
40.0000 meq | Freq: Once | ORAL | Status: AC
  Administered 2014-08-10: 40 meq
  Filled 2014-08-10: qty 30

## 2014-08-10 MED ORDER — POTASSIUM CHLORIDE 10 MEQ/50ML IV SOLN
10.0000 meq | INTRAVENOUS | Status: AC
  Administered 2014-08-10 (×2): 10 meq via INTRAVENOUS
  Filled 2014-08-10: qty 50

## 2014-08-10 MED ORDER — FREE WATER
200.0000 mL | Freq: Four times a day (QID) | Status: DC
  Administered 2014-08-10 – 2014-08-11 (×4): 200 mL

## 2014-08-10 NOTE — Progress Notes (Signed)
CRITICAL VALUE ALERT  Critical value received:  K= 2.9 Hgb= 6.7 Date of notification:  11/17 Time of notification:  0529 hrs  Critical value read back: yes  Nurse who received alert: Loraine LericheMark  MD notified :  Dr Deterding  Time notified page:  [phone] hrs

## 2014-08-10 NOTE — Progress Notes (Signed)
eLink Physician-Brief Progress Note Patient Name: Stephanie SilvasVicky D Doyle DOB: [birth year] MRN: [id number]   Date of Service  08/10/2014  HPI/Events of Note  Hgb 6.7 down from 7.4   eICU Interventions  Plan: Transfuse 1 unit pRBC Post-transfusion CBC     Intervention Category Intermediate Interventions: Bleeding - evaluation and treatment with blood products  DETERDING,ELIZABETH 08/10/2014, 5:30 AM

## 2014-08-10 NOTE — Progress Notes (Signed)
[facility] PR[facility]OCOL FOR AM LAB REPLACEMENT ONLY  The patient does [facility] the [facility] Electrolyte [facility] Protocol based on the criteria listed below:   1. Is GFR >/= 40 ml/min? Yes.    Patient's GFR today is 59 2. Is urine output >/= 0.5 ml/kg/hr for the last 6 hours? Yes.   Patient's UOP is 1.43 ml/kg/hr 3. Is BUN < 60 mg/dL? Yes.    Patient's BUN today is 15 4. Abnormal electrolyte(s): Potassium (2.9) 5. Ordered repletion with: per protocol 6. If a panic level lab has been reported, has the CCM MD in charge been notified? Yes.  .   Physician:  Dr. Mliss Saxeterding  Stephanie Doyle 08/10/2014 5:16 AM

## 2014-08-10 NOTE — Plan of Care (Signed)
Problem: Phase I Progression Outcomes Goal: Neuro exam at baseline or improved Outcome: Progressing     

## 2014-08-10 NOTE — Consult Note (Addendum)
[facility]  Total days of antibiotics 12        Day 3 imi        Day 3 vanco        Day 2 fluconazole       Reason for Consult: fungemia    Referring Physician: cabbell  Principal Problem:   SAH (subarachnoid hemorrhage) Active Problems:   Acute respiratory failure   Absolute anemia   Subarachnoid hemorrhage   Hypomagnesemia   Hypophosphatemia    HPI: Stephanie Doyle is a 63 y.o. female  with hx of COPD presented to OSH on 11/2 with severe headache, found to have Clay. AMS in ED requiring intubation. 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 She had tracheostomy placed on 11/12. She was initially placed on 8 days of vancomycin and piptazo and off of antibiotics for roughly 3 days when leukocytosis started to markedly increase to 25-28K, possible hypoxia, thus she was started on vanco and imipenem for possible hcap. She was pan cultured. CVL was pulled on 11/15. She does have picc line in place since 11/09. On evening of 11/16, Blood cx grew yeast, thus fluconazole started on 11/16. Leukocytosis improved  Past Medical History  Diagnosis Date  . COPD (chronic obstructive pulmonary disease)     Allergies: No Known Allergies  Current antibiotics:   MEDICATIONS: . sodium chloride   Intravenous Once  . antiseptic oral rinse  7 mL Mouth Rinse QID  . chlorhexidine  15 mL Mouth Rinse BID  . famotidine  20 mg Per Tube Q12H  . feeding supplement (VITAL HIGH PROTEIN)  1,000 mL Per Tube Q24H  . fentaNYL  200 mcg Intravenous Once  . fluconazole (DIFLUCAN) IV  400 mg Intravenous Q24H  . free water  200 mL Per Tube Q6H  . imipenem-cilastatin  500 mg Intravenous Q8H  . insulin aspart  0-20 Units Subcutaneous 6 times per day  . ipratropium-albuterol  3 mL Nebulization Q4H  . levETIRAcetam   500 mg Per Tube BID  . metoCLOPramide (REGLAN) injection  10 mg Intravenous 3 times per day  . niMODipine  60 mg Per Tube Q4H  . [START ON 08/11/2014] NiMODipine  60 mg Per Tube Q4H  . simvastatin  40 mg Oral q1800  . sodium chloride  10-40 mL Intracatheter Q12H  . vancomycin  750 mg Intravenous Q12H    History  Substance Use Topics  . Smoking status: Not on file  . Smokeless tobacco: Not on file  . Alcohol Use: Not on file    No family history on file.  Review of Systems - Unable to obtain, patient is nonverbal  OBJECTIVE: Temp:  [98.2 F (36.8 C)-99.3 F (37.4 C)] 99.3 F (37.4 C) (11/17 1500) Pulse Rate:  [40-138] 118 (11/17 1500) Resp:  [8-40] 20 (11/17 1500) BP: (91-153)/(42-87) 134/53 mmHg (11/17 1500) SpO2:  [90 %-100 %] 100 % (11/17 1500) FiO2 (%):  [40 %] 40 % (11/17 1400) Weight:  [165 lb 12.6 oz (75.2 kg)] 165 lb 12.6 oz (75.2 kg) (11/17 0318) Physical Exam  Constitutional:  oriented to person, place, and time. appears well-developed and well-nourished. Does not follow command Skull= staples from craniectomy well healing incision HENT: injected right eye with soft tissue edema. Opens eyes to verbal stimuli Mouth/Throat: Oropharynx is clear and moist. No  oropharyngeal exudate.  Cardiovascular: Normal rate, regular rhythm and normal heart sounds. Exam reveals no gallop and no friction rub.  No murmur heard.  Pulmonary/Chest: Effort normal and breath sounds normal. No respiratory distress.  has no wheezes.  Abdominal: Soft. Bowel sounds are normal.  exhibits no distension. There is no tenderness.  Lymphadenopathy: no cervical adenopathy.  Skin: Skin is warm and dry. No rash noted. No erythema. But markedly edematous c/w anasarca Ext: right UE picc line in place   LABS: Results for orders placed or performed during the hospital encounter of 07/26/14 (from the past 48 hour(s))  Blood gas, arterial     Status: Abnormal   Collection Time: 08/08/14  3:34 PM    Result Value Ref Range   FIO2 0.40 %   Delivery systems VENTILATOR    Mode PRESSURE REGULATED VOLUME CONTROL    VT 370 mL   Rate 30 resp/min   Peep/cpap 5.0 cm H20   pH, Arterial 7.222 (L) 7.350 - 7.450   pCO2 arterial 40.8 35.0 - 45.0 mmHg   pO2, Arterial 128.0 (H) 80.0 - 100.0 mmHg   Bicarbonate 16.2 (L) 20.0 - 24.0 mEq/L   TCO2 17.4 0 - 100 mmol/L   Acid-base deficit 10.1 (H) 0.0 - 2.0 mmol/L   O2 Saturation 98.3 %   Patient temperature 98.6    Collection site LEFT RADIAL    Drawn by [id number]    Sample type ARTERIAL DRAW    Allens test (pass/fail) PASS PASS  Glucose, capillary     Status: Abnormal   Collection Time: 08/08/14  5:10 PM  Result Value Ref Range   Glucose-Capillary 112 (H) 70 - 99 mg/dL  Basic metabolic panel     Status: Abnormal   Collection Time: 08/08/14  5:19 PM  Result Value Ref Range   Sodium 140 137 - 147 mEq/L   Potassium 3.6 (L) 3.7 - 5.3 mEq/L   Chloride 108 96 - 112 mEq/L   CO2 16 (L) 19 - 32 mEq/L   Glucose, Bld 104 (H) 70 - 99 mg/dL   BUN 25 (H) 6 - 23 mg/dL   Creatinine, Ser 1.47 (H) 0.50 - 1.10 mg/dL   Calcium 8.8 8.4 - 10.5 mg/dL   GFR calc non Af Amer 36 (L) >90 mL/min   GFR calc Af Amer 42 (L) >90 mL/min    Comment: (NOTE) The eGFR has been calculated using the CKD EPI equation. This calculation has not been validated in all clinical situations. eGFR's persistently <90 mL/min signify possible Chronic Kidney Disease.    Anion gap 16 (H) 5 - 15  Glucose, capillary     Status: None   Collection Time: 08/08/14  8:41 PM  Result Value Ref Range   Glucose-Capillary 94 70 - 99 mg/dL   Comment 1 Documented in Chart    Comment 2 Notify RN   Glucose, capillary     Status: Abnormal   Collection Time: 08/08/14 11:36 PM  Result Value Ref Range   Glucose-Capillary 113 (H) 70 - 99 mg/dL   Comment 1 Documented in Chart    Comment 2 Notify RN   Basic metabolic panel     Status: Abnormal   Collection Time: 08/09/14  2:54 AM  Result Value Ref Range    Sodium 146 137 - 147 mEq/L   Potassium 3.3 (L) 3.7 - 5.3 mEq/L   Chloride 114 (H) 96 - 112 mEq/L   CO2 16 (L) 19 - 32 mEq/L   Glucose, Bld  229 (H) 70 - 99 mg/dL   BUN 19 6 - 23 mg/dL   Creatinine, Ser 0.98 0.50 - 1.10 mg/dL   Calcium 8.3 (L) 8.4 - 10.5 mg/dL   GFR calc non Af Amer 59 (L) >90 mL/min   GFR calc Af Amer 69 (L) >90 mL/min    Comment: (NOTE) The eGFR has been calculated using the CKD EPI equation. This calculation has not been validated in all clinical situations. eGFR's persistently <90 mL/min signify possible Chronic Kidney Disease.    Anion gap 16 (H) 5 - 15  Glucose, capillary     Status: Abnormal   Collection Time: 08/09/14  3:36 AM  Result Value Ref Range   Glucose-Capillary 133 (H) 70 - 99 mg/dL   Comment 1 Documented in Chart    Comment 2 Notify RN   Glucose, capillary     Status: Abnormal   Collection Time: 08/09/14  8:10 AM  Result Value Ref Range   Glucose-Capillary 158 (H) 70 - 99 mg/dL   Comment 1 Notify RN    Comment 2 Documented in Chart   Glucose, capillary     Status: Abnormal   Collection Time: 08/09/14  2:20 PM  Result Value Ref Range   Glucose-Capillary 131 (H) 70 - 99 mg/dL  Glucose, capillary     Status: Abnormal   Collection Time: 08/09/14  3:58 PM  Result Value Ref Range   Glucose-Capillary 134 (H) 70 - 99 mg/dL   Comment 1 Documented in Chart    Comment 2 Notify RN   Glucose, capillary     Status: Abnormal   Collection Time: 08/09/14  8:10 PM  Result Value Ref Range   Glucose-Capillary 112 (H) 70 - 99 mg/dL  Glucose, capillary     Status: Abnormal   Collection Time: 08/10/14 12:59 AM  Result Value Ref Range   Glucose-Capillary 135 (H) 70 - 99 mg/dL  Glucose, capillary     Status: Abnormal   Collection Time: 08/10/14  4:09 AM  Result Value Ref Range   Glucose-Capillary 114 (H) 70 - 99 mg/dL  BMET in AM     Status: Abnormal   Collection Time: 08/10/14  4:25 AM  Result Value Ref Range   Sodium 157 (H) 137 - 147 mEq/L     Comment: DELTA CHECK NOTED   Potassium 2.9 (LL) 3.7 - 5.3 mEq/L    Comment: CRITICAL RESULT CALLED TO, READ BACK BY AND VERIFIED WITH: SIBENGE M,RN 08/10/14 0509 WAYK    Chloride 125 (H) 96 - 112 mEq/L   CO2 20 19 - 32 mEq/L   Glucose, Bld 115 (H) 70 - 99 mg/dL   BUN 15 6 - 23 mg/dL   Creatinine, Ser 0.98 0.50 - 1.10 mg/dL   Calcium 9.4 8.4 - 10.5 mg/dL   GFR calc non Af Amer 59 (L) >90 mL/min   GFR calc Af Amer 69 (L) >90 mL/min    Comment: (NOTE) The eGFR has been calculated using the CKD EPI equation. This calculation has not been validated in all clinical situations. eGFR's persistently <90 mL/min signify possible Chronic Kidney Disease.    Anion gap 12 5 - 15  CBC     Status: Abnormal   Collection Time: 08/10/14  4:25 AM  Result Value Ref Range   WBC 10.4 4.0 - 10.5 K/uL   RBC 2.34 (L) 3.87 - 5.11 MIL/uL   Hemoglobin 6.7 (LL) 12.0 - 15.0 g/dL    Comment: REPEATED TO VERIFY CRITICAL RESULT CALLED  TO, READ BACK BY AND VERIFIED WITH: Ulis Rias RN [id number] [phone] GREEN R    HCT 20.7 (L) 36.0 - 46.0 %   MCV 88.5 78.0 - 100.0 fL   MCH 28.6 26.0 - 34.0 pg   MCHC 32.4 30.0 - 36.0 g/dL   RDW 16.4 (H) 11.5 - 15.5 %   Platelets 366 150 - 400 K/uL  Prepare RBC     Status: None   Collection Time: 08/10/14  6:00 AM  Result Value Ref Range   Order Confirmation ORDER PROCESSED BY BLOOD BANK   Glucose, capillary     Status: Abnormal   Collection Time: 08/10/14  8:33 AM  Result Value Ref Range   Glucose-Capillary 143 (H) 70 - 99 mg/dL   Comment 1 Notify RN    Comment 2 Documented in Chart   Glucose, capillary     Status: Abnormal   Collection Time: 08/10/14 11:52 AM  Result Value Ref Range   Glucose-Capillary 103 (H) 70 - 99 mg/dL   Comment 1 Notify RN    Comment 2 Documented in Chart   Basic metabolic panel     Status: Abnormal   Collection Time: 08/10/14 12:02 PM  Result Value Ref Range   Sodium 159 (H) 137 - 147 mEq/L   Potassium 4.2 3.7 - 5.3 mEq/L   Chloride 129 (H) 96 -  112 mEq/L   CO2 20 19 - 32 mEq/L   Glucose, Bld 106 (H) 70 - 99 mg/dL   BUN 14 6 - 23 mg/dL   Creatinine, Ser 0.93 0.50 - 1.10 mg/dL   Calcium 9.4 8.4 - 10.5 mg/dL   GFR calc non Af Amer 63 (L) >90 mL/min   GFR calc Af Amer 73 (L) >90 mL/min    Comment: (NOTE) The eGFR has been calculated using the CKD EPI equation. This calculation has not been validated in all clinical situations. eGFR's persistently <90 mL/min signify possible Chronic Kidney Disease.    Anion gap 10 5 - 15  CBC     Status: Abnormal   Collection Time: 08/10/14 12:02 PM  Result Value Ref Range   WBC 13.0 (H) 4.0 - 10.5 K/uL   RBC 3.01 (L) 3.87 - 5.11 MIL/uL   Hemoglobin 8.3 (L) 12.0 - 15.0 g/dL    Comment: POST TRANSFUSION SPECIMEN   HCT 26.0 (L) 36.0 - 46.0 %   MCV 86.4 78.0 - 100.0 fL   MCH 27.6 26.0 - 34.0 pg   MCHC 31.9 30.0 - 36.0 g/dL   RDW 15.7 (H) 11.5 - 15.5 %   Platelets 387 150 - 400 K/uL    MICRO: 11/15 blood cx 1 of 2 yeast 11/15 resp cx NOPF IMAGING: Ct Angio Head W/cm &/or Wo Cm  08/09/2014   CLINICAL DATA:  Cerebral artery vasospasm following subarachnoid hemorrhage and clipping of anterior communicating artery aneurysm.  EXAM: CT ANGIOGRAPHY HEAD  TECHNIQUE: Multidetector CT imaging of the head was performed using the standard protocol during bolus administration of intravenous contrast. Multiplanar CT image reconstructions and MIPs were obtained to evaluate the vascular anatomy.  CONTRAST:  45m OMNIPAQUE IOHEXOL 350 MG/ML SOLN  COMPARISON:  Head CT 08/06/2014.  Head CTA/ perfusion 08/03/2014.  FINDINGS: Sequelae of right frontotemporal craniotomy and anterior communicating artery aneurysm clipping are again identified. Scattered, minimal residual subarachnoid hemorrhage is again seen, slightly decreased from prior CT. Small volume of blood remains in the occipital horn of the left lateral ventricle, also mildly decreased. No definite new intracranial hemorrhage  is identified. Edema in the right  greater than left inferior frontal lobes, corpus callosum, and cingulate gyri as well as right basal ganglia does not appear significantly changed in extent from the prior CT. Patchy hypodensities in the centrum semiovale bilaterally are similar to the prior study, with slightly increased white matter hypoattenuation more posteriorly in the right parietal lobe. Findings are consistent with areas of previously described infarction. Focal edema in the right frontal lobe extending to the cortex is also unchanged. There is no significant midline shift. Ventricles appear minimally larger than on the prior study. No abnormal enhancement is identified.  Right-sided scalp skin staples are noted with associated soft tissue edema and fluid at the craniotomy site. Scalp fluid also extends to the left temporal region as well as posteriorly in the right occipital region. Orbits are unremarkable. Left mastoid effusion is noted. There is partial opacification of multiple ethmoid air cells bilaterally. Mild mucosal thickening in a small amount of fluid are present in the maxillary and sphenoid sinuses bilaterally.  Visualized distal vertebral arteries are patent with the left being slightly larger than the right. Left PICA origin is patent. AICA and SCA origins are patent. Basilar artery is patent without focal stenosis. Again noted is a hypoplastic left P1 segment with a prominent left posterior communicating artery. PCAs are otherwise unremarkable.  Visualized distal cervical internal carotid arteries are mildly tortuous. Internal carotid arteries are patent bilaterally to the carotid termini. Minimal carotid siphon calcification is noted without stenosis. MCAs are patent without significant stenosis identified.  Anterior communicating artery aneurysm clip is again seen. Diminutive and slightly irregular appearance of the left greater than right A1 segments is similar to the prior CTA, with irregular narrowing of the proximal A2  segments having mildly improved in the interim. More distally, A3 segment irregular narrowing is similar to the prior CTA.  Review of the MIP images confirms the above findings.  IMPRESSION: 1. Irregular narrowing involving both anterior cerebral arteries, consistent with previously described vasospasm. Proximal A2 narrowing has improved in the interim, with similar appearance of left greater than right A1 narrowing. 2. Small volume residual subarachnoid and intraventricular hemorrhage, slightly decreased from prior CT. 3. Minimal interval ventricular enlargement. 4. Stable to minimally increased edema related to previously described ACA infarcts.   Electronically Signed   By: Logan Bores   On: 08/09/2014 13:31   Dg Chest Port 1 View  08/09/2014   CLINICAL DATA:  Acute respiratory failure with hypoxia.J96.01 (ICD-10-CM)  EXAM: PORTABLE CHEST - 1 VIEW  COMPARISON:  08/08/2014  FINDINGS: The right jugular central line has been removed. PICC line tip in the SVC region. Patient has a tracheostomy tube. Scattered interstitial densities, particularly at the right lung base. Heart size is normal. There may be linear atelectasis in the right hilum. Negative for a pneumothorax.  IMPRESSION: Patchy interstitial densities, particularly in the right lower lung. Findings may represent a combination of atelectasis and mild edema. Minimal change from the previous examination.  Support apparatuses as described.   Electronically Signed   By: Markus Daft M.D.   On: 08/09/2014 07:59   Assessment/Plan:  63yo F with SAH s/p right frontotemporal craniotomy and anterior communicating artery aneurysm clipping develops leukocytosis in setting of ongoing respiratory distress concern for hcap, found to have have fungemia.  - recommend to have picc line removed, and get new one placed in left arm. Unable to do line holiday due to diffuse anasarca - will repeat blood cx to ensure  fungal infection is clearing, likely related to central  line that has been removed - recommend repeat TTE to see if signs of endocarditis - if she has ongoing fungemia, will need to get TEE - if continues to do well, consider stopping vancomycin and imipenem  Fortino Haag B. [facility] [phone]

## 2014-08-10 NOTE — Progress Notes (Signed)
Pt seen and examined. No issues overnight.   EXAM: Temp:  [97.7 F (36.5 C)-99 F (37.2 C)] 99 F (37.2 C) (11/17 0715) Pulse Rate:  [25-129] 104 (11/17 0731) Resp:  [10-31] 30 (11/17 0731) BP: (63-169)/(41-83) 129/53 mmHg (11/17 0731) SpO2:  [86 %-100 %] 100 % (11/17 0732) FiO2 (%):  [40 %] 40 % (11/17 0733) Weight:  [75.2 kg (165 lb 12.6 oz)] 75.2 kg (165 lb 12.6 oz) (11/17 0318) Intake/Output      11/16 0701 - 11/17 0700 11/17 0701 - 11/18 0700   I.V. (mL/kg) 883.8 (11.8)    Other 110    NG/GT 440    IV Piggyback 1350 50   Total Intake(mL/kg) 2783.8 (37) 50 (0.7)   Urine (mL/kg/hr) 3925 (2.2)    Drains     Total Output 3925     Net -1141.2 +50         Opens eyes to voice Occasionally follows commands RUE/RLE Now moves LUE spontaneously W/D LLE Wound c/d/i  LABS: Lab Results  Component Value Date   CREATININE 0.98 08/10/2014   BUN 15 08/10/2014   NA 157* 08/10/2014   K 2.9* 08/10/2014   CL 125* 08/10/2014   CO2 20 08/10/2014   Lab Results  Component Value Date   WBC 10.4 08/10/2014   HGB 6.7* 08/10/2014   HCT 20.7* 08/10/2014   MCV 88.5 08/10/2014   PLT 366 08/10/2014    IMAGING: CTA reviewed, stable to slightly improved primarily A1/proximal A2 spasm. No significant spasm of MCA/Basilar/PCA. Minimally increased ventricular size  IMPRESSION: - 63 y.o. female SAH d# 15 s/p Acom clipping - Neurologically slightly improved - Fungemia / Fungal UTI - VDRF - Anemia - electrolyte derangement  PLAN: - Off pressors/colloid. Cont Nimotop/Zocor. - Diflucan/Vancomycin for fungemia/UTI - Wean vent as tolerated - 1U PRBC being transfused. Recheck CBC - Will check sodium again this afternoon, repeat BMP this evening

## 2014-08-10 NOTE — Progress Notes (Signed)
PULMONARY / CRITICAL CARE MEDICINE   Name: Stephanie Doyle MRN: [id number] DOB: 1951/02/23    ADMISSION DATE:  07/26/2014 CONSULTATION DATE:  07/26/2014  REFERRING MD :  Dr. Franky Machoabbell  CHIEF COMPLAINT:  Headache  INITIAL PRESENTATION: 63 year old female with no known medical history presented [facility] 11/2 with severe headache, found to have SAH. AMS in ED requiring intubation. [facility]. PCCM consulted for vent and ICU management.  STUDIES:  11/2 CT head > diffuse SAH, frontal and temporal bilaterally.  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 Status post anterior communicating artery aneurysm clipping; frontal lobe edema stable, small volume intraventricular hemorrhage stable, regression of SAH, no new intracranial abnormality  SIGNIFICANT EVENTS: 11/2[facility]- SAH, Intubated for airway protection, [facility] 11/4- Right pterional craniotomy for clipping of anterior communicating artery aneurysm, complex 11/5 - no cuff leak >> started steroids 11/7 not weaning , lowest ps was 12 11/12 trach > 11/14 hgb 6.6, no active sign of bleeding 11/15 worsening renal failure, vasopressin and neo added for BP goals 11/17 Hb 6.7 - 1 U  SUBJECTIVE:   Afebrile Excellent UO More responsive   VITAL SIGNS: Temp:  [97.7 F (36.5 C)-99 F (37.2 C)] 99 F (37.2 C) (11/17 0715) Pulse Rate:  [25-129] 104 (11/17 0731) Resp:  [10-31] 30 (11/17 0731) BP: (63-169)/(41-83) 129/53 mmHg (11/17 0731) SpO2:  [86 %-100 %] 100 % (11/17 0732) FiO2 (%):  [40 %] 40 % (11/17 0733) Weight:  [165 lb 12.6 oz (75.2 kg)] 165 lb 12.6 oz (75.2 kg) (11/17 0318)   HEMODYNAMICS: CVP:  [15 mmHg-82  mmHg] 15 mmHg  VENTILATOR SETTINGS: Vent Mode:  [-] PRVC FiO2 (%):  [40 %] 40 % Set Rate:  [30 bmp] 30 bmp Vt Set:  [370 mL] 370 mL PEEP:  [5 cmH20] 5 cmH20 Plateau Pressure:  [5 cmH20-32 cmH20] 29 cmH20  INTAKE / OUTPUT:  Intake/Output Summary (Last 24 hours) at 08/10/14 0834 Last data filed at 08/10/14 0800  Gross per 24 hour  Intake 2567.18 ml  Output   3575 ml  Net -1007.82 ml    PHYSICAL EXAMINATION:  Gen: on vent HEENT: scalp incision c/d/i, trach in place PULM: decreased BS, crackles bilaterally CV: Tachy regular, no mgr Ab: BS+, soft Ext: massive edema/anasarca Derm: no breakdown Neuro: purposeful on right, more awake, does not follow commands  LABS:  CBC  Recent Labs Lab 08/08/14 0208 08/08/14 0938 08/10/14 0425  WBC 28.3* 27.4* 10.4  HGB 7.6* 7.4* 6.7*  HCT 23.9* 22.9* 20.7*  PLT 390 349 366   Coag's  Recent Labs Lab 08/05/14 0512  APTT 31  INR 1.27   BMET  Recent Labs Lab 08/08/14 1719 08/09/14 0254 08/10/14 0425  NA 140 146 157*  K 3.6* 3.3* 2.9*  CL 108 114* 125*  CO2 16* 16* 20  BUN 25* 19 15  CREATININE 1.47* 0.98 0.98  GLUCOSE 104* 229* 115*   Electrolytes  Recent Labs Lab 08/05/14 0512 08/06/14 0606 08/07/14 0450  08/08/14 1719 08/09/14 0254 08/10/14 0425  CALCIUM 8.6 8.9 8.9  < > 8.8 8.3* 9.4  MG 2.0 1.9 2.0  --   --   --   --  PHOS 2.3 3.2 3.1  --   --   --   --   < > = values in this interval not displayed. Sepsis Markers No results for input(s): LATICACIDVEN, PROCALCITON, O2SATVEN in the last 168 hours.   ABG  Recent Labs Lab 08/07/14 2238 08/08/14 0226 08/08/14 1534  PHART 7.162* 7.367 7.222*  PCO2ART 56.8* 27.1* 40.8  PO2ART 91.0 28.0* 128.0*   Cardiac Enzymes  Recent Labs Lab 08/08/14 0938  TROPONINI <0.30   Glucose  Recent Labs Lab 08/09/14 0810 08/09/14 1420 08/09/14 1558 08/09/14 2010 08/10/14 0059 08/10/14 0409  GLUCAP 158* 131* 134* 112* 135* 114*    Imaging Ct Angio Head  W/cm &/or Wo Cm  08/09/2014   CLINICAL DATA:  Cerebral artery vasospasm following subarachnoid hemorrhage and clipping of anterior communicating artery aneurysm.  EXAM: CT ANGIOGRAPHY HEAD  TECHNIQUE: Multidetector CT imaging of the head was performed using the standard protocol during bolus administration of intravenous contrast. Multiplanar CT image reconstructions and MIPs were obtained to evaluate the vascular anatomy.  CONTRAST:  50mL OMNIPAQUE IOHEXOL 350 MG/ML SOLN  COMPARISON:  Head CT 08/06/2014.  Head CTA/ perfusion 08/03/2014.  FINDINGS: Sequelae of right frontotemporal craniotomy and anterior communicating artery aneurysm clipping are again identified. Scattered, minimal residual subarachnoid hemorrhage is again seen, slightly decreased from prior CT. Small volume of blood remains in the occipital horn of the left lateral ventricle, also mildly decreased. No definite new intracranial hemorrhage is identified. Edema in the right greater than left inferior frontal lobes, corpus callosum, and cingulate gyri as well as right basal ganglia does not appear significantly changed in extent from the prior CT. Patchy hypodensities in the centrum semiovale bilaterally are similar to the prior study, with slightly increased white matter hypoattenuation more posteriorly in the right parietal lobe. Findings are consistent with areas of previously described infarction. Focal edema in the right frontal lobe extending to the cortex is also unchanged. There is no significant midline shift. Ventricles appear minimally larger than on the prior study. No abnormal enhancement is identified.  Right-sided scalp skin staples are noted with associated soft tissue edema and fluid at the craniotomy site. Scalp fluid also extends to the left temporal region as well as posteriorly in the right occipital region. Orbits are unremarkable. Left mastoid effusion is noted. There is partial opacification of multiple ethmoid air cells  bilaterally. Mild mucosal thickening in a small amount of fluid are present in the maxillary and sphenoid sinuses bilaterally.  Visualized distal vertebral arteries are patent with the left being slightly larger than the right. Left PICA origin is patent. AICA and SCA origins are patent. Basilar artery is patent without focal stenosis. Again noted is a hypoplastic left P1 segment with a prominent left posterior communicating artery. PCAs are otherwise unremarkable.  Visualized distal cervical internal carotid arteries are mildly tortuous. Internal carotid arteries are patent bilaterally to the carotid termini. Minimal carotid siphon calcification is noted without stenosis. MCAs are patent without significant stenosis identified.  Anterior communicating artery aneurysm clip is again seen. Diminutive and slightly irregular appearance of the left greater than right A1 segments is similar to the prior CTA, with irregular narrowing of the proximal A2 segments having mildly improved in the interim. More distally, A3 segment irregular narrowing is similar to the prior CTA.  Review of the MIP images confirms the above findings.  IMPRESSION: 1. Irregular narrowing involving both anterior cerebral arteries, consistent with previously described vasospasm. Proximal A2 narrowing has improved in the interim,  with similar appearance of left greater than right A1 narrowing. 2. Small volume residual subarachnoid and intraventricular hemorrhage, slightly decreased from prior CT. 3. Minimal interval ventricular enlargement. 4. Stable to minimally increased edema related to previously described ACA infarcts.   Electronically Signed   By: Sebastian Ache   On: 08/09/2014 13:31   Dg Chest Port 1 View  08/09/2014   CLINICAL DATA:  Acute respiratory failure with hypoxia.J96.01 (ICD-10-CM)  EXAM: PORTABLE CHEST - 1 VIEW  COMPARISON:  08/08/2014  FINDINGS: The right jugular central line has been removed. PICC line tip in the SVC region.  Patient has a tracheostomy tube. Scattered interstitial densities, particularly at the right lung base. Heart size is normal. There may be linear atelectasis in the right hilum. Negative for a pneumothorax.  IMPRESSION: Patchy interstitial densities, particularly in the right lower lung. Findings may represent a combination of atelectasis and mild edema. Minimal change from the previous examination.  Support apparatuses as described.   Electronically Signed   By: Richarda Overlie M.D.   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 Fentanyl gtt for analgesia/sedation-transition to intermittent soon Nimodipine x 6 wks Started statin 11/4.  PULMONARY OETT 11/2 >>>trach   A: Acute hypoxemic respiratory failure > HCAP complicated by pulmonary edema  COPD, not in exacerbation. Concern for upper airway edema s/p tracheostomy 11/12 P:   Resume SBTs    CARDIOVASCULAR Rt IJ CVL 11/03 >> A:  HHH therapy in setting of SAH -off pressors Septic shock P:  CVP monitoring Levophed off    RENAL  Recent Labs Lab 08/08/14 1719 08/09/14 0254 08/10/14 0425  NA 140 146 157*    Recent Labs Lab 08/08/14 1719 08/09/14 0254 08/10/14 0425  K 3.6* 3.3* 2.9*   A:   hypernatremia - na jumped up AKI > resolved  P:   Rechk Na - avoiding hypotonic fluids Replete electrolytes as indicated. Would like to diurese soon if OK with neuro sx (vasospasm)   GASTROINTESTINAL A:   Nutrition Ileus P:   Tube feeds  Increase to 30/h - reglan x 6 doses SUP: pepcid  HEMATOLOGIC A:   Anemia  of critical illness >  Transfused 1 U PRBC 11/14 Leukocytosis. P: 1 U on 11/16 F/u CBC SCD for DVT prevention  INFECTIOUS A:   Sepsis 11/04 > no clear source, treated with broad spectrum Abx Septic shock 11/15 P:    Blood 11/02 >>NTD Blood 11/03 >>NTD Blood 11/04 >>NTD C diff 11/06 >> neg Sputum 11/2>>neg UC 11-2>>neg ==== Blood 11/15 >>yeast >> Urine 11/15 >> yeast  Resp 11/15 >>  Pulled  Central line (19 days old), maintain PICC Restart anbitiocs Vanc/Imipenem 11/15 >  Started diflucan - low threshold to broaden to micafungin if clinical deterioration  ENDOCRINE A:   No acute issues P:   SSI while on tube feeds  FAMILY  - Updates: No family bedside  TODAY'S SUMMARY: Fungemia , beyond danger period for vasospasm,  MAP goal 65 & try to diurese eventually once Na better &  vasospasm resolved   The patient is critically ill with multiple organ systems failure and requires high complexity decision making for assessment and support, frequent evaluation and titration of therapies, application of advanced monitoring technologies and extensive interpretation of multiple databases. Critical Care Time devoted to patient care services described in this note is 35 minutes.    Cyril Mourningakesh Genola Yuille MD. Tonny BollmanFCCP. [facility] Pager [phone] If no response call [phone]

## 2014-08-10 NOTE — Progress Notes (Signed)
Dr. Conchita ParisNundkumar aware of sodium level. Orders received to begin serial BMETs and free water.   Holly Bodilyulbertson, Bethany Leigh

## 2014-08-11 DIAGNOSIS — J9621 Acute and chronic respiratory failure with hypoxia: Secondary | ICD-10-CM

## 2014-08-11 DIAGNOSIS — B49 Unspecified mycosis: Secondary | ICD-10-CM | POA: Diagnosis not present

## 2014-08-11 DIAGNOSIS — R21 Rash and other nonspecific skin eruption: Secondary | ICD-10-CM

## 2014-08-11 DIAGNOSIS — I339 Acute and subacute endocarditis, unspecified: Secondary | ICD-10-CM

## 2014-08-11 DIAGNOSIS — J9620 Acute and chronic respiratory failure, unspecified whether with hypoxia or hypercapnia: Secondary | ICD-10-CM | POA: Diagnosis present

## 2014-08-11 LAB — BASIC METABOLIC PANEL
ANION GAP: 10 (ref 5–15)
Anion gap: 9 (ref 5–15)
Anion gap: 11 (ref 5–15)
Anion gap: 13 (ref 5–15)
BUN: 13 mg/dL (ref 6–23)
BUN: 13 mg/dL (ref 6–23)
BUN: 14 mg/dL (ref 6–23)
BUN: 16 mg/dL (ref 6–23)
CHLORIDE: 121 meq/L — AB (ref 96–112)
CHLORIDE: 126 meq/L — AB (ref 96–112)
CHLORIDE: 127 meq/L — AB (ref 96–112)
CO2: 20 mEq/L (ref 19–32)
CO2: 20 mEq/L (ref 19–32)
CO2: 21 mEq/L (ref 19–32)
CO2: 22 mEq/L (ref 19–32)
Calcium: 9.1 mg/dL (ref 8.4–10.5)
Calcium: 9.2 mg/dL (ref 8.4–10.5)
Calcium: 9.3 mg/dL (ref 8.4–10.5)
Calcium: 9.3 mg/dL (ref 8.4–10.5)
Chloride: 125 mEq/L — ABNORMAL HIGH (ref 96–112)
Creatinine, Ser: 0.89 mg/dL (ref 0.50–1.10)
Creatinine, Ser: 0.89 mg/dL (ref 0.50–1.10)
Creatinine, Ser: 0.87 mg/dL (ref 0.50–1.10)
Creatinine, Ser: 0.91 mg/dL (ref 0.50–1.10)
GFR calc Af Amer: 77 mL/min — ABNORMAL LOW (ref >90)
GFR calc Af Amer: 77 mL/min — ABNORMAL LOW (ref >90)
GFR calc Af Amer: 79 mL/min — ABNORMAL LOW (ref >90)
GFR calc Af Amer: 75 mL/min — ABNORMAL LOW (ref >90)
GFR calc non Af Amer: 67 mL/min — ABNORMAL LOW (ref >90)
GFR calc non Af Amer: 67 mL/min — ABNORMAL LOW (ref >90)
GFR, EST NON AFRICAN AMERICAN: 68 mL/min — AB (ref >90)
GFR, EST NON AFRICAN AMERICAN: 65 mL/min — AB (ref >90)
GLUCOSE: 115 mg/dL — AB (ref 70–99)
GLUCOSE: 140 mg/dL — AB (ref 70–99)
GLUCOSE: 153 mg/dL — AB (ref 70–99)
Glucose, Bld: 90 mg/dL (ref 70–99)
POTASSIUM: 3.7 meq/L (ref 3.7–5.3)
POTASSIUM: 3.9 meq/L (ref 3.7–5.3)
POTASSIUM: 4.2 meq/L (ref 3.7–5.3)
Potassium: 3.6 mEq/L — ABNORMAL LOW (ref 3.7–5.3)
SODIUM: 157 meq/L — AB (ref 137–147)
SODIUM: 156 meq/L — AB (ref 137–147)
Sodium: 155 mEq/L — ABNORMAL HIGH (ref 137–147)
Sodium: 157 mEq/L — ABNORMAL HIGH (ref 137–147)

## 2014-08-11 LAB — DIFFERENTIAL
BASOS PCT: 0 % (ref 0–1)
Basophils Absolute: 0 10*3/uL (ref 0.0–0.1)
EOS ABS: 0.5 10*3/uL (ref 0.0–0.7)
EOS PCT: 5 % (ref 0–5)
LYMPHS ABS: 0.8 10*3/uL (ref 0.7–4.0)
Lymphocytes Relative: 9 % — ABNORMAL LOW (ref 12–46)
Monocytes Absolute: 1.1 10*3/uL — ABNORMAL HIGH (ref 0.1–1.0)
Monocytes Relative: 12 % (ref 3–12)
Neutro Abs: 6.7 10*3/uL (ref 1.7–7.7)
Neutrophils Relative %: 74 % (ref 43–77)

## 2014-08-11 LAB — CBC
HEMATOCRIT: 24 % — AB (ref 36.0–46.0)
HEMOGLOBIN: 7.6 g/dL — AB (ref 12.0–15.0)
MCH: 27.4 pg (ref 26.0–34.0)
MCHC: 31.7 g/dL (ref 30.0–36.0)
MCV: 86.6 fL (ref 78.0–100.0)
Platelets: 341 10*3/uL (ref 150–400)
RBC: 2.77 MIL/uL — ABNORMAL LOW (ref 3.87–5.11)
RDW: 16.3 % — AB (ref 11.5–15.5)
WBC: 9.2 10*3/uL (ref 4.0–10.5)

## 2014-08-11 LAB — GLUCOSE, CAPILLARY
GLUCOSE-CAPILLARY: 123 mg/dL — AB (ref 70–99)
GLUCOSE-CAPILLARY: 134 mg/dL — AB (ref 70–99)
GLUCOSE-CAPILLARY: 89 mg/dL (ref 70–99)
Glucose-Capillary: 103 mg/dL — ABNORMAL HIGH (ref 70–99)
Glucose-Capillary: 163 mg/dL — ABNORMAL HIGH (ref 70–99)
Glucose-Capillary: 125 mg/dL — ABNORMAL HIGH (ref 70–99)

## 2014-08-11 LAB — TYPE AND SCREEN
ABO/RH(D): A NEG
Antibody Screen: NEGATIVE
UNIT DIVISION: 0
Unit division: 0

## 2014-08-11 LAB — MAGNESIUM: MAGNESIUM: 2.2 mg/dL (ref 1.5–2.5)

## 2014-08-11 LAB — CULTURE, RESPIRATORY W GRAM STAIN: Special Requests: NORMAL

## 2014-08-11 LAB — CULTURE, RESPIRATORY

## 2014-08-11 LAB — PHOSPHORUS: PHOSPHORUS: 2.4 mg/dL (ref 2.3–4.6)

## 2014-08-11 MED ORDER — VITAL AF 1.2 CAL PO LIQD
1000.0000 mL | ORAL | Status: DC
  Administered 2014-08-12 – 2014-08-26 (×11): 1000 mL
  Filled 2014-08-11 (×19): qty 1000

## 2014-08-11 MED ORDER — FUROSEMIDE 10 MG/ML IJ SOLN
20.0000 mg | Freq: Once | INTRAMUSCULAR | Status: AC
  Administered 2014-08-11: 20 mg via INTRAVENOUS
  Filled 2014-08-11 (×2): qty 2

## 2014-08-11 MED ORDER — ADULT MULTIVITAMIN LIQUID CH
5.0000 mL | Freq: Every day | ORAL | Status: DC
  Administered 2014-08-11 – 2014-08-26 (×16): 5 mL
  Filled 2014-08-11 (×16): qty 5

## 2014-08-11 MED ORDER — POTASSIUM CHLORIDE 20 MEQ/15ML (10%) PO SOLN
40.0000 meq | Freq: Once | ORAL | Status: AC
  Administered 2014-08-11: 40 meq
  Filled 2014-08-11: qty 30

## 2014-08-11 MED ORDER — FREE WATER
200.0000 mL | Status: DC
  Administered 2014-08-11 – 2014-08-13 (×13): 200 mL

## 2014-08-11 MED ORDER — PRO-STAT SUGAR FREE PO LIQD
30.0000 mL | Freq: Every day | ORAL | Status: DC
  Administered 2014-08-11 – 2014-08-26 (×16): 30 mL
  Filled 2014-08-11 (×16): qty 30

## 2014-08-11 NOTE — Progress Notes (Signed)
  Echocardiogram 2D Echocardiogram has been performed.  Stephanie Doyle FRANCES 08/11/2014, 3:46 PM

## 2014-08-11 NOTE — Progress Notes (Signed)
[facility]    Date of Admission:  07/26/2014   Total days of antibiotics 13        Day 4 imi        Day 4 vanco        Day 3 fluconazole   ID: Stephanie Doyle is a 63 y.o. female  with SAH s/p right frontotemporal craniotomy and anterior communicating artery aneurysm clipping develops leukocytosis in setting of ongoing respiratory distress concern for hcap, found to have have fungemia. Principal Problem:   SAH (subarachnoid hemorrhage) Active Problems:   Acute respiratory failure   Absolute anemia   Subarachnoid hemorrhage   Hypomagnesemia   Hypophosphatemia   Acute on chronic respiratory failure   Fungemia    Subjective: Afebrile, but developed diffuse erythematous blanching rash to chest, abdomen, back and upper thighs/inguinal areas. Lower extremity spared.  picc line on right AC pulled, new picc line placed on left arm  Medications:  . sodium chloride   Intravenous Once  . antiseptic oral rinse  7 mL Mouth Rinse QID  . chlorhexidine  15 mL Mouth Rinse BID  . famotidine  20 mg Per Tube Q12H  . feeding supplement (PRO-STAT SUGAR FREE 64)  30 mL Per Tube Daily  . fentaNYL  200 mcg Intravenous Once  . fluconazole (DIFLUCAN) IV  400 mg Intravenous Q24H  . free water  200 mL Per Tube Q4H  . imipenem-cilastatin  500 mg Intravenous Q8H  . insulin aspart  0-20 Units Subcutaneous 6 times per day  . ipratropium-albuterol  3 mL Nebulization Q4H  . levETIRAcetam  500 mg Per Tube BID  . metoCLOPramide (REGLAN) injection  5 mg Intravenous Q12H  . multivitamin  5 mL Per Tube Daily  . NiMODipine  60 mg Oral 6 times per day  . simvastatin  40 mg Oral q1800  . sodium chloride  10-40 mL Intracatheter Q12H    Objective: Vital signs in last 24 hours: Temp:  [99 F (37.2 C)-99.9 F (37.7 C)] 99.9 F (37.7 C) (11/18 1600) Pulse Rate:  [30-132] 113 (11/18 1600) Resp:  [0-35] 21 (11/18 1600) BP: (110-173)/(48-82) 122/62 mmHg (11/18 1600) SpO2:  [100 %]  100 % (11/18 1600) FiO2 (%):  [40 %] 40 % (11/18 1632) Weight:  [162 lb 0.6 oz (73.5 kg)] 162 lb 0.6 oz (73.5 kg) (11/18 0500) Physical Exam  Constitutional:  Opens eyes to name. Moves all extremities, less apparent on left leg spontaneouslyNo distress.  HENT: staples on crani incision is clean Mouth/Throat: Oropharynx is clear and moist. No oropharyngeal exudate.  Cardiovascular: Normal rate, regular rhythm and normal heart sounds. Exam reveals no gallop and no friction rub.  No murmur heard.  Pulmonary/Chest: Effort normal and breath sounds normal. No respiratory distress.  has no wheezes.  Abdominal: Soft. Bowel sounds are normal.  exhibits no distension. There is no tenderness.  Lymphadenopathy: no cervical adenopathy.  Neurological: alert and oriented to person, place, and time.  Skin: diffuse red rash to chest wall, abdomen, and upper thigh/inguinal area. Ext: diffuse anasarca   Lab Results  Recent Labs  08/10/14 1202  08/11/14 0600 08/11/14 1215  WBC 13.0*  --  9.2  --   HGB 8.3*  --  7.6*  --   HCT 26.0*  --  24.0*  --   NA 159*  < > 157* 157*  K 4.2  < > 3.6* 4.2  CL 129*  < > 127* 125*  CO2 20  < >  20 21  BUN 14  < > 13 14  CREATININE 0.93  < > 0.89 0.87  < > = values in this interval not displayed. Liver Panel No results for input(s): PROT, ALBUMIN, AST, ALT, ALKPHOS, BILITOT, BILIDIR, IBILI in the last 72 hours. Sedimentation Rate No results for input(s): ESRSEDRATE in the last 72 hours. C-Reactive Protein No results for input(s): CRP in the last 72 hours.  Microbiology: 11/15 blood cx 1 of 2 yeast 11/15 resp cx NOPF 11/17 blood cx ngtd  Studies/Results: No results found.   Assessment/Plan: Fungemia = continue with fluconazole for a minimum of 2 wk. Await for tte results. If further evidence of fungemia, then will need TEE. Indwelling lines have been switched, unfortunately not able to get line holiday due to iv antibiotic needs and iv access due to  diffuse anasarca  Rash = likely drug rash. Will stop vancomycin for now. Check diff to see if any eos. Will take a while to resolve. If it worsens, may need to stop imipenem  Pneumonia = she is on day 4 of 7 of hcap, started in setting of respiratory distress, pressor need and leukocytosis. No pathogen identified causing pneumonia. cxr not revealing for large infiltrate  Jakub Debold, [facility] [id number] [facility] Cell: [phone] Pager: [phone]  08/11/2014, 5:18 PM

## 2014-08-11 NOTE — Progress Notes (Signed)
PULMONARY / CRITICAL CARE MEDICINE   Name: Stephanie Doyle MRN: [id number] DOB: [birth year]    ADMISSION DATE:  07/26/2014 CONSULTATION DATE:  07/26/2014  REFERRING MD :  Dr. Franky Macho  CHIEF COMPLAINT:  Headache  INITIAL PRESENTATION: 63 year old female with no known medical history presented to [facility] 11/2 with severe headache, found to have SAH. AMS in ED requiring intubation. She was transferred to [facility] for further eval. PCCM consulted for vent and ICU management.  STUDIES:  11/2 CT head > diffuse SAH, frontal and temporal bilaterally.  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 Status post anterior communicating artery aneurysm clipping; frontal lobe edema stable, small volume intraventricular hemorrhage stable, regression of SAH, no new intracranial abnormality  SIGNIFICANT EVENTS: 11/2[facility], Intubated for airway protection, transferred to [facility] NICU 11/4- Right pterional craniotomy for clipping of anterior communicating artery aneurysm, complex 11/5 - no cuff leak >> started steroids 11/7 not weaning , lowest ps was 12 11/12 trach > 11/14 hgb 6.6, no active sign of bleeding 11/15 worsening renal failure, vasopressin and neo added for BP goals 11/17 Hb 6.7 - 1 U. Afebrile. Excellent UO. More responsive    SUBJECTIVE/OVERNIGHT/INTERVAL HX 08/11/14: ID recommending TTE and PICC line change. Continues to be on trach and vent. Anasarca +. Low grade fever 67F +  VITAL SIGNS: Temp:  [99 F (37.2 C)-99.7 F (37.6 C)] 99.5 F (37.5 C) (11/18 0900) Pulse Rate:  [30-370] 116 (11/18 0900) Resp:  [0-40] 22 (11/18 0900) BP: (110-173)/(48-91) 131/65 mmHg (11/18 0900) SpO2:  [90 %-100 %] 100 % (11/18  0900) FiO2 (%):  [40 %] 40 % (11/18 0900) Weight:  [73.5 kg (162 lb 0.6 oz)] 73.5 kg (162 lb 0.6 oz) (11/18 0500)   HEMODYNAMICS: CVP:  [10 mmHg-15 mmHg] 15 mmHg  VENTILATOR SETTINGS: Vent Mode:  [-] PRVC FiO2 (%):  [40 %] 40 % Set Rate:  [30 bmp] 30 bmp Vt Set:  [370 mL] 370 mL PEEP:  [5 cmH20] 5 cmH20 Plateau Pressure:  [20 cmH20-32 cmH20] 22 cmH20  INTAKE / OUTPUT:  Intake/Output Summary (Last 24 hours) at 08/11/14 0937 Last data filed at 08/11/14 0900  Gross per 24 hour  Intake   3050 ml  Output   3525 ml  Net   -475 ml    PHYSICAL EXAMINATION:  Gen: on vent HEENT: scalp incision c/d/i, trach in place PULM: decreased BS, crackles bilaterally CV: Tachy regular, no mgr Ab: BS+, soft Ext: massive edema/anasarca Derm: no breakdown Neuro: purposeful on right, more awake, does  follow some commands +  LABS: PULMONARY  Recent Labs Lab 08/07/14 1700 08/07/14 1928 08/07/14 2238 08/08/14 0226 08/08/14 1534  PHART 7.228* 7.174* 7.162* 7.367 7.222*  PCO2ART 48.5* 55.8* 56.8* 27.1* 40.8  PO2ART 122.0* 102.0* 91.0 28.0* 128.0*  HCO3 19.5* 20.5 20.4 19.6* 16.2*  TCO2 21.0 22 22 21  17.4  O2SAT 98.4 96.0 94.0 92.0 98.3    CBC  Recent Labs Lab 08/10/14 0425 08/10/14 1202 08/11/14 0600  HGB 6.7* 8.3* 7.6*  HCT 20.7* 26.0* 24.0*  WBC 10.4 13.0* 9.2  PLT 366 387 341    COAGULATION  Recent Labs Lab 08/05/14 0512  INR 1.27    CARDIAC   Recent Labs Lab  08/08/14 0938  TROPONINI <0.30   No results for input(s): PROBNP in the last 168 hours.   CHEMISTRY  Recent Labs Lab 08/05/14 0512 08/06/14 0606 08/07/14 0450  08/10/14 0425 08/10/14 1202 08/10/14 1809 08/11/14 0023 08/11/14 0600  NA 140 137 141  < > 157* 159* 157* 155* 157*  K 4.0 3.9 3.9  < > 2.9* 4.2 4.0 3.9 3.6*  CL 105 103 107  < > 125* 129* 126* 126* 127*  CO2 23 19 21   < > 20 20 20 20 20   GLUCOSE 138* 149* 217*  < > 115* 106* 150* 140* 90  BUN 14 16 17   < > 15 14 14 13 13    CREATININE 0.73 1.17* 1.03  < > 0.98 0.93 0.93 0.89 0.89  CALCIUM 8.6 8.9 8.9  < > 9.4 9.4 9.5 9.1 9.2  MG 2.0 1.9 2.0  --   --   --   --   --  2.2  PHOS 2.3 3.2 3.1  --   --   --   --   --  2.4  < > = values in this interval not displayed. Estimated Creatinine Clearance: 56.4 mL/min (by C-G formula based on Cr of 0.89).   LIVER  Recent Labs Lab 08/05/14 0512  INR 1.27     INFECTIOUS No results for input(s): LATICACIDVEN, PROCALCITON in the last 168 hours.   ENDOCRINE CBG (last 3)   Recent Labs  08/10/14 2348 08/11/14 0404 08/11/14 0802  GLUCAP 134* 123* 89         IMAGING x48h Ct Angio Head W/cm &/or Wo Cm  08/09/2014   CLINICAL DATA:  Cerebral artery vasospasm following subarachnoid hemorrhage and clipping of anterior communicating artery aneurysm.  EXAM: CT ANGIOGRAPHY HEAD  TECHNIQUE: Multidetector CT imaging of the head was performed using the standard protocol during bolus administration of intravenous contrast. Multiplanar CT image reconstructions and MIPs were obtained to evaluate the vascular anatomy.  CONTRAST:  50mL OMNIPAQUE IOHEXOL 350 MG/ML SOLN  COMPARISON:  Head CT 08/06/2014.  Head CTA/ perfusion 08/03/2014.  FINDINGS: Sequelae of right frontotemporal craniotomy and anterior communicating artery aneurysm clipping are again identified. Scattered, minimal residual subarachnoid hemorrhage is again seen, slightly decreased from prior CT. Small volume of blood remains in the occipital horn of the left lateral ventricle, also mildly decreased. No definite new intracranial hemorrhage is identified. Edema in the right greater than left inferior frontal lobes, corpus callosum, and cingulate gyri as well as right basal ganglia does not appear significantly changed in extent from the prior CT. Patchy hypodensities in the centrum semiovale bilaterally are similar to the prior study, with slightly increased white matter hypoattenuation more posteriorly in the right  parietal lobe. Findings are consistent with areas of previously described infarction. Focal edema in the right frontal lobe extending to the cortex is also unchanged. There is no significant midline shift. Ventricles appear minimally larger than on the prior study. No abnormal enhancement is identified.  Right-sided scalp skin staples are noted with associated soft tissue edema and fluid at the craniotomy site. Scalp fluid also extends to the left temporal region as well as posteriorly in the right occipital region. Orbits are unremarkable. Left mastoid effusion is noted. There is partial opacification of multiple ethmoid air cells bilaterally. Mild mucosal thickening in a small amount of fluid are present in the maxillary and sphenoid sinuses bilaterally.  Visualized distal vertebral arteries are patent with the left being slightly larger than the right. Left  PICA origin is patent. AICA and SCA origins are patent. Basilar artery is patent without focal stenosis. Again noted is a hypoplastic left P1 segment with a prominent left posterior communicating artery. PCAs are otherwise unremarkable.  Visualized distal cervical internal carotid arteries are mildly tortuous. Internal carotid arteries are patent bilaterally to the carotid termini. Minimal carotid siphon calcification is noted without stenosis. MCAs are patent without significant stenosis identified.  Anterior communicating artery aneurysm clip is again seen. Diminutive and slightly irregular appearance of the left greater than right A1 segments is similar to the prior CTA, with irregular narrowing of the proximal A2 segments having mildly improved in the interim. More distally, A3 segment irregular narrowing is similar to the prior CTA.  Review of the MIP images confirms the above findings.  IMPRESSION: 1. Irregular narrowing involving both anterior cerebral arteries, consistent with previously described vasospasm. Proximal A2 narrowing has improved in the  interim, with similar appearance of left greater than right A1 narrowing. 2. Small volume residual subarachnoid and intraventricular hemorrhage, slightly decreased from prior CT. 3. Minimal interval ventricular enlargement. 4. Stable to minimally increased edema related to previously described ACA infarcts.   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 Fentanyl gtt for analgesia/sedation-transition to intermittent soon Nimodipine x 6 wks Started statin 11/4    PULMONARY OETT 11/2 >>>trach   A: Acute hypoxemic respiratory failure > HCAP complicated by pulmonary edema  COPD, not in exacerbation. Concern for upper airway edema s/p tracheostomy 11/12   - unable to tolerate SBT due to anxiety, deconditioning and wasting  P:   Resume SBTs as tolerated Full vent support   CARDIOVASCULAR Rt IJ CVL 11/03 >> A:  HHH therapy in setting of SAH -off pressors Septic shock    - off 3% saline as of 08/11/14 and is on Free water 200mL Q4h for hypernatremia. Has anasarca +  P:  CVP monitoring Levophed off  Continue free water Lasix 20mg  x 1 IV Check bnp  RENAL  Recent Labs Lab 08/10/14 1809 08/11/14 0023 08/11/14 0600  NA 157* 155* 157*    Recent Labs Lab 08/10/14 1809 08/11/14 0023 08/11/14 0600  K 4.0 3.9 3.6*   A:   hypernatremia - na jumped up AKI > resolved  P:   Rechk Na - avoiding hypotonic fluids Replete electrolytes as indicated. Would like to diurese soon if OK with neuro sx (vasospasm)   GASTROINTESTINAL A:   Nutrition Ileus P:   Tube feeds  Increase to 30/h - reglan x 6 doses SUP: pepcid  HEMATOLOGIC A:   Anemia of critical illness >   Transfused 1 U PRBC 11/14 and 07/30/14 Leukocytosis. P: F/u CBC: PRBC for hgb <7gm%Only SCD for DVT prevention  INFECTIOUS\  Blood 11/02 >>NTD Blood 11/03 >>NTD Blood 11/04 >>NTD C diff 11/06 >> neg Sputum 11/2>>neg UC 11-2>>neg ==== Blood 11/15 >>yeast >> Urine 11/15 >> yeast  Resp 11/15 >> .............. Blood 08/10/14 >>  A:   Sepsis 11/04 > no clear source, treated with broad spectrum Abx Septic shock 11/15 due to fungemia  P:   Pulled  Central line (8112 days old)  08/09/14 Restart anbitiocs Vanc/Imipenem 11/15 >  Started diflucan 08/09/14  - low threshold to broaden to micafungin if clinical deterioration Change picc from right to left - ordered 07/29/14 Order TTE - and depending on course TEE  ENDOCRINE A:   No acute issues P:   SSI while on tube feeds  FAMILY  - Updates: No family bedside  TODAY'S SUMMARY: Fungemia , beyond danger period for vasospasm,  MAP goal 65 & try to diurese eventually once Na better & vasospasm resolved; will give lasix 08/11/14    The patient is critically ill with multiple organ systems failure and requires high complexity decision making for assessment and support, frequent evaluation and titration of therapies, application of advanced monitoring technologies and extensive interpretation of multiple databases.   Critical Care Time devoted to patient care services described in this note is  30  Minutes. This time reflects time of care of this signee Dr Kalman ShanMurali [place] Sloop. This critical care time does not reflect procedure time, or teaching time or supervisory time of PA/NP/Med student/Med Resident etc but could involve care discussion time    Dr. Kalman ShanMurali Toua Stites, M.D., [facility] Carolina[facility]C.P Pulmonary and Critical Care Medicine Staff Physician [facility] [place] Pager: [phone], If no answer or between  15:00h - 7:00h: call [phone]  08/11/2014 9:53 AM

## 2014-08-11 NOTE — Progress Notes (Addendum)
NUTRITION FOLLOW-UP  INTERVENTION:  D/C Vital High Protein   Vital AF 1.2 @ 40 ml/hr via PEG  30 ml Prostat daily.    MVI daily  Tube feeding regimen provides 1252 kcal (97% of needs), 87 grams of protein, and 778 ml of H2O.  Total free water: 1978 ml  NUTRITION DIAGNOSIS: Inadequate oral intake related to inability to eat as evidenced by NPO status; ongoing.   Goal: Pt to meet >/= 90% of their estimated nutrition needs; met.    Monitor:  Respiratory status, TF tolerance, weight trend  ASSESSMENT: Pt with hx of severe COPD admitted for [facility].  Pt s/p angiogram and clipping.   Patient is currently on ventilator support via trach MV: 11.4 L/min Temp (24hrs), Avg:99.4 F (37.4 C), Min:99 F (37.2 C), Max:99.7 F (37.6 C)  Pt discussed during ICU rounds and with RN.  Pt s/p trach and PEG 11/12.  Pt with severe anasarca, free water increased due to elevated sodium. Pt is positive 60 lb since admission.  Free water: 200 ml every 4 hours = 1200 ml Pt also started on lasix.  Pt had TF stopped 11/15 but then resumed. Pt now on Reglan.  Potassium low on replacement.  Per RN pt tolerating TF, no residuals.   Height: Ht Readings from Last 1 Encounters:  08/01/14 5' (1.524 m)    Weight: Wt Readings from Last 1 Encounters:  08/11/14 162 lb 0.6 oz (73.5 kg)  Admission weight: 102 lb (46.7 kg) 11/2 Pt positive 32 L since admission   BMI:  Body mass index is 31.65 kg/(m^2).  Estimated Nutritional Needs: Kcal: 1287 Protein: 65-80 grams Fluid: > 1.5 L/day  Skin:  head incision Weeping bilateral arms  Diet Order: Diet NPO time specified   Intake/Output Summary (Last 24 hours) at 08/11/14 1105 Last data filed at 08/11/14 1100  Gross per 24 hour  Intake   2590 ml  Output   3275 ml  Net   -685 ml    Last BM:  Rectal tube/pouch inserted 11/17  Labs:   Recent Labs Lab 08/06/14 0606 08/07/14 0450  08/10/14 1809 08/11/14 0023 08/11/14 0600  NA 137 141  < >  157* 155* 157*  K 3.9 3.9  < > 4.0 3.9 3.6*  CL 103 107  < > 126* 126* 127*  CO2 19 21  < > _0 BUN 16 17  < > _1 CREATININE 1.17* 1.03  < > 0.93 0.89 0.89  CALCIUM 8.9 8.9  < > 9.5 9.1 9.2  MG 1.9 2.0  --   --   --  2.2  PHOS 3.2 3.1  --   --   --  2.4  GLUCOSE 149* 217*  < > 150* 140* 90  < > = values in this interval not displayed.  CBG (last 3)   Recent Labs  08/10/14 2348 08/11/14 0404 08/11/14 0802  GLUCAP 134* 123* 89    Scheduled Meds: . sodium chloride   Intravenous Once  . antiseptic oral rinse  7 mL Mouth Rinse QID  . chlorhexidine  15 mL Mouth Rinse BID  . famotidine  20 mg Per Tube Q12H  . feeding supplement (VITAL HIGH PROTEIN)  1,000 mL Per Tube Q24H  . fentaNYL  200 mcg Intravenous Once  . fluconazole (DIFLUCAN) IV  400 mg Intravenous Q24H  . free water  200 mL Per Tube Q4H  . furosemide  20 mg Intravenous Once  . imipenem-cilastatin  500 mg Intravenous Q8H  . insulin aspart  0-20 Units Subcutaneous 6 times per day  . ipratropium-albuterol  3 mL Nebulization Q4H  . levETIRAcetam  500 mg Per Tube BID  . metoCLOPramide (REGLAN) injection  5 mg Intravenous Q12H  . NiMODipine  60 mg Oral 6 times per day  . potassium chloride  40 mEq Per Tube Once  . simvastatin  40 mg Oral q1800  . sodium chloride  10-40 mL Intracatheter Q12H  . vancomycin  750 mg Intravenous Q12H    Continuous Infusions: . sodium chloride Stopped (08/06/14 0900)  . fentaNYL infusion INTRAVENOUS 100 mcg/hr (08/11/14 1100)    [place], [place], [place] Pager [phone] After Hours Pager

## 2014-08-12 DIAGNOSIS — D520 Dietary folate deficiency anemia: Secondary | ICD-10-CM

## 2014-08-12 DIAGNOSIS — J9620 Acute and chronic respiratory failure, unspecified whether with hypoxia or hypercapnia: Secondary | ICD-10-CM

## 2014-08-12 LAB — BASIC METABOLIC PANEL
Anion gap: 12 (ref 5–15)
Anion gap: 12 (ref 5–15)
Anion gap: 12 (ref 5–15)
BUN: 18 mg/dL (ref 6–23)
BUN: 17 mg/dL (ref 6–23)
BUN: 19 mg/dL (ref 6–23)
CALCIUM: 9 mg/dL (ref 8.4–10.5)
CALCIUM: 8.8 mg/dL (ref 8.4–10.5)
CO2: 22 mEq/L (ref 19–32)
CO2: 22 mEq/L (ref 19–32)
CO2: 23 meq/L (ref 19–32)
CREATININE: 0.9 mg/dL (ref 0.50–1.10)
Calcium: 9.2 mg/dL (ref 8.4–10.5)
Chloride: 120 mEq/L — ABNORMAL HIGH (ref 96–112)
Chloride: 122 mEq/L — ABNORMAL HIGH (ref 96–112)
Chloride: 120 mEq/L — ABNORMAL HIGH (ref 96–112)
Creatinine, Ser: 0.94 mg/dL (ref 0.50–1.10)
Creatinine, Ser: 0.85 mg/dL (ref 0.50–1.10)
GFR calc Af Amer: 72 mL/min — ABNORMAL LOW (ref >90)
GFR calc Af Amer: 76 mL/min — ABNORMAL LOW (ref >90)
GFR calc Af Amer: 82 mL/min — ABNORMAL LOW (ref >90)
GFR calc non Af Amer: 70 mL/min — ABNORMAL LOW (ref >90)
GFR, EST NON AFRICAN AMERICAN: 62 mL/min — AB (ref >90)
GFR, EST NON AFRICAN AMERICAN: 66 mL/min — AB (ref >90)
GLUCOSE: 125 mg/dL — AB (ref 70–99)
GLUCOSE: 151 mg/dL — AB (ref 70–99)
Glucose, Bld: 156 mg/dL — ABNORMAL HIGH (ref 70–99)
POTASSIUM: 3.8 meq/L (ref 3.7–5.3)
Potassium: 3.8 mEq/L (ref 3.7–5.3)
Potassium: 3.8 mEq/L (ref 3.7–5.3)
Sodium: 154 mEq/L — ABNORMAL HIGH (ref 137–147)
Sodium: 156 mEq/L — ABNORMAL HIGH (ref 137–147)
Sodium: 155 mEq/L — ABNORMAL HIGH (ref 137–147)

## 2014-08-12 LAB — CBC WITH DIFFERENTIAL/PLATELET
BASOS PCT: 0 % (ref 0–1)
Basophils Absolute: 0 10*3/uL (ref 0.0–0.1)
EOS ABS: 0.7 10*3/uL (ref 0.0–0.7)
Eosinophils Relative: 6 % — ABNORMAL HIGH (ref 0–5)
HEMATOCRIT: 25.1 % — AB (ref 36.0–46.0)
Hemoglobin: 7.9 g/dL — ABNORMAL LOW (ref 12.0–15.0)
Lymphocytes Relative: 9 % — ABNORMAL LOW (ref 12–46)
Lymphs Abs: 0.9 10*3/uL (ref 0.7–4.0)
MCH: 27.6 pg (ref 26.0–34.0)
MCHC: 31.5 g/dL (ref 30.0–36.0)
MCV: 87.8 fL (ref 78.0–100.0)
MONO ABS: 1 10*3/uL (ref 0.1–1.0)
Monocytes Relative: 9 % (ref 3–12)
Neutro Abs: 8.3 10*3/uL — ABNORMAL HIGH (ref 1.7–7.7)
Neutrophils Relative %: 76 % (ref 43–77)
PLATELETS: 329 10*3/uL (ref 150–400)
RBC: 2.86 MIL/uL — ABNORMAL LOW (ref 3.87–5.11)
RDW: 16.8 % — ABNORMAL HIGH (ref 11.5–15.5)
WBC: 10.9 10*3/uL — ABNORMAL HIGH (ref 4.0–10.5)

## 2014-08-12 LAB — GLUCOSE, CAPILLARY
GLUCOSE-CAPILLARY: 129 mg/dL — AB (ref 70–99)
GLUCOSE-CAPILLARY: 136 mg/dL — AB (ref 70–99)
GLUCOSE-CAPILLARY: 132 mg/dL — AB (ref 70–99)
Glucose-Capillary: 110 mg/dL — ABNORMAL HIGH (ref 70–99)
Glucose-Capillary: 120 mg/dL — ABNORMAL HIGH (ref 70–99)
Glucose-Capillary: 153 mg/dL — ABNORMAL HIGH (ref 70–99)

## 2014-08-12 LAB — PHOSPHORUS: Phosphorus: 3.2 mg/dL (ref 2.3–4.6)

## 2014-08-12 LAB — MAGNESIUM: Magnesium: 2 mg/dL (ref 1.5–2.5)

## 2014-08-12 LAB — PRO B NATRIURETIC PEPTIDE: Pro B Natriuretic peptide (BNP): 1422 pg/mL — ABNORMAL HIGH (ref 0–125)

## 2014-08-12 MED ORDER — SODIUM CHLORIDE 0.9 % IV SOLN
25.0000 ug/h | INTRAVENOUS | Status: DC
  Administered 2014-08-12: 100 ug/h via INTRAVENOUS
  Administered 2014-08-13 – 2014-08-15 (×4): 50 ug/h via INTRAVENOUS
  Filled 2014-08-12 (×3): qty 50

## 2014-08-12 MED ORDER — FUROSEMIDE 10 MG/ML IJ SOLN
20.0000 mg | Freq: Every day | INTRAMUSCULAR | Status: DC
  Administered 2014-08-12 – 2014-08-20 (×9): 20 mg via INTRAVENOUS
  Filled 2014-08-12 (×9): qty 2

## 2014-08-12 NOTE — Progress Notes (Signed)
Sutures removed from trach per MD order. No complications noted. Pt tolerated well.

## 2014-08-12 NOTE — Progress Notes (Signed)
Pt seen and examined. No issues overnight.   EXAM: Temp:  [99.3 F (37.4 C)-100 F (37.8 C)] 99.9 F (37.7 C) (11/19 1500) Pulse Rate:  [30-125] 109 (11/19 1500) Resp:  [21-37] 21 (11/19 1500) BP: (97-165)/(34-102) 101/37 mmHg (11/19 1500) SpO2:  [78 %-100 %] 97 % (11/19 1500) FiO2 (%):  [40 %] 40 % (11/19 1223) Weight:  [68.2 kg (150 lb 5.7 oz)] 68.2 kg (150 lb 5.7 oz) (11/19 0440) Intake/Output      11/18 0701 - 11/19 0700 11/19 0701 - 11/20 0700   I.V. (mL/kg) 380 (5.6) 155 (2.3)   Blood     Other     NG/GT 1680 640   IV Piggyback 500 100   Total Intake(mL/kg) 2560 (37.5) 895 (13.1)   Urine (mL/kg/hr) 4675 (2.9)    Stool 650 (0.4)    Total Output 5325     Net -2765 +895         Awake, alert Mouthing words Follows command RUE/RLE Moves LUE purposefully No significant movement LLE Wound c/d/i  LABS: Lab Results  Component Value Date   CREATININE 0.85 08/12/2014   BUN 19 08/12/2014   NA 155* 08/12/2014   K 3.8 08/12/2014   CL 120* 08/12/2014   CO2 23 08/12/2014   Lab Results  Component Value Date   WBC 10.9* 08/12/2014   HGB 7.9* 08/12/2014   HCT 25.1* 08/12/2014   MCV 87.8 08/12/2014   PLT 329 08/12/2014    IMPRESSION: - 63 y.o. female SAH d# 17 s/p Acom clipping - Neurologically improving - Fungemia, on diflucan - Red man syndrome from vancomycin - Hypernatremia  PLAN: - Cont Nimotop/Zocor for another 4 days - Abx for fungemia. Vanco d/c'ed - Cont free H2O 200Q4 by PEG. May increase tomorrow if Na isnt responding.

## 2014-08-12 NOTE — Progress Notes (Signed)
Pt seen at this time for follow up on trach consult.  Pt remains on full vent support.  Sutures remain in place, bedside RT Lupita LeashDonna called and made aware that sutures are due to come out today.  Will continue to follow pt for progress.

## 2014-08-12 NOTE — Trach Care Team (Signed)
Trach Care Progression Note   Patient Details Name: Stephanie Doyle MRN: [id number] DOB: [birth year] Today's Date: 08/12/2014   Tracheostomy Assessment    Tracheostomy Shiley 6 mm Cuffed (Active)  Status Secured 08/12/2014 12:23 PM  Site Assessment Clean;Dry 08/12/2014 12:23 PM  Site Care Cleansed;Dried;Dressing applied 08/11/2014  3:59 PM  Inner Cannula Care Changed/new 08/11/2014  3:59 PM  Ties Assessment Clean;Dry;Secure 08/12/2014 12:23 PM  Cuff pressure (cm) 25 cm 08/12/2014 12:23 PM  Emergency Equipment at bedside Yes 08/12/2014 12:23 PM     Care Needs  Fungal infection this past week, full vent.    Respiratory Therapy O2 Device: Ventilator FiO2 (%): 40 % SpO2: 99 %    Speech Language Pathology  SLP chart review complete: Patient not ready for SLP services due to medical complications.     Nutritional Patient's Current Diet: NPO, Tube feeding Tube Feeding: Vital High Protein Tube Feeding Frequency: Continuous Tube Feeding Strength: Full strength       Provider Anders SimmondsPete Babcock, NP present.   Corine Solorio, [name] 08/12/2014, 2:17 PM

## 2014-08-12 NOTE — Progress Notes (Signed)
ANTIBIOTIC CONSULT NOTE - follow up Pharmacy Consult for fluconazole/Primaxin  Indication: HCAP/sepsis  No Known Allergies  Patient Measurements: Height: 5' (152.4 cm) Weight: 150 lb 5.7 oz (68.2 kg) IBW/kg (Calculated) : 45.5      Vital Signs: Temp: 99.5 F (37.5 C) (11/19 0900) Temp Source: Core (Comment) (11/19 0800) BP: 105/54 mmHg (11/19 0900) Pulse Rate: 98 (11/19 0900) Intake/Output from previous day: 11/18 0701 - 11/19 0700 In: 2510 [I.V.:330; NG/GT:1680; IV Piggyback:500] Out: 4098 [[id number] [Urine:4675; Stool:650] Intake/Output from this shift: Total I/O In: 200 [NG/GT:200] Out: -   Labs:  Recent Labs  08/10/14 1202  08/11/14 0600  08/11/14 1825 08/12/14 08/12/14 0600  WBC 13.0*  --  9.2  --   --   --  10.9*  HGB 8.3*  --  7.6*  --   --   --  7.9*  PLT 387  --  341  --   --   --  329  CREATININE 0.93  < > 0.89  < > 0.91 0.94 0.90  < > = values in this interval not displayed. Estimated Creatinine Clearance: 53.7 mL/min (by C-G formula based on Cr of 0.9). No results for input(s): VANCOTROUGH, VANCOPEAK, VANCORANDOM, GENTTROUGH, GENTPEAK, GENTRANDOM, TOBRATROUGH, TOBRAPEAK, TOBRARND, AMIKACINPEAK, AMIKACINTROU, AMIKACIN in the last 72 hours.   Microbiology: Recent Results (from the past 720 hour(s))  MRSA PCR Screening     Status: None   Collection Time: 07/26/14  7:58 PM  Result Value Ref Range Status   MRSA by PCR NEGATIVE NEGATIVE Final    Comment:        The GeneXpert MRSA Assay (FDA approved for NASAL specimens only), is one component of a comprehensive MRSA colonization surveillance program. It is not intended to diagnose MRSA infection nor to guide or monitor treatment for MRSA infections.   Culture, Urine     Status: None   Collection Time: 07/26/14 11:12 PM  Result Value Ref Range Status   Specimen Description URINE, CATHETERIZED  Final   Special Requests NONE  Final   Culture  Setup Time   Final    07/27/2014 10:07 Performed at [facility]    Colony Count NO GROWTH Performed at [facility]   Final   Culture NO GROWTH Performed at [facility]   Final   Report Status 07/28/2014 FINAL  Final  Culture, blood (routine x 2)     Status: None   Collection Time: 07/26/14 11:49 PM  Result Value Ref Range Status   Specimen Description BLOOD LEFT ARM  Final   Special Requests BOTTLES DRAWN AEROBIC ONLY 5CC  Final   Culture  Setup Time   Final    07/27/2014 08:58 Performed at [facility]    Culture   Final    NO GROWTH 5 DAYS Performed at [facility]    Report Status 08/02/2014 FINAL  Final  Culture, respiratory (NON-Expectorated)     Status: None   Collection Time: 07/26/14 11:52 PM  Result Value Ref Range Status   Specimen Description TRACHEAL ASPIRATE  Final   Special Requests NONE  Final   Gram Stain   Final    RARE WBC PRESENT, PREDOMINANTLY PMN RARE SQUAMOUS EPITHELIAL CELLS PRESENT RARE GRAM POSITIVE COCCI IN PAIRS Performed at [facility]    Culture   Final    Non-Pathogenic Oropharyngeal-type Flora Isolated. Performed at [facility]    Report Status 07/29/2014 FINAL  Final  Culture, blood (routine x 2)  Status: None   Collection Time: 07/27/14 12:04 AM  Result Value Ref Range Status   Specimen Description BLOOD LEFT HAND  Final   Special Requests BOTTLES DRAWN AEROBIC ONLY 4CC  Final   Culture  Setup Time   Final    07/27/2014 08:58 Performed at [facility]    Culture   Final    NO GROWTH 5 DAYS Performed at [facility]    Report Status 08/02/2014 FINAL  Final  Culture, respiratory (NON-Expectorated)     Status: None   Collection Time: 07/28/14  8:49 AM  Result Value Ref Range Status   Specimen Description TRACHEAL ASPIRATE  Final   Special Requests Normal  Final   Gram Stain   Final    FEW WBC PRESENT,BOTH PMN AND MONONUCLEAR NO SQUAMOUS EPITHELIAL CELLS SEEN NO ORGANISMS SEEN Performed at [facility]    Culture   Final    Non-Pathogenic Oropharyngeal-type Flora Isolated. Performed at [facility]    Report Status 07/30/2014 FINAL  Final  Culture, blood (routine x 2)     Status: None   Collection Time: 07/28/14  9:43 AM  Result Value Ref Range Status   Specimen Description BLOOD LEFT HAND  Final   Special Requests BOTTLES DRAWN AEROBIC ONLY 10CC  Final   Culture  Setup Time   Final    07/28/2014 15:42 Performed at [facility]    Culture   Final    NO GROWTH 5 DAYS Performed at [facility]    Report Status 08/03/2014 FINAL  Final  Culture, blood (routine x 2)     Status: None   Collection Time: 07/28/14 10:00 AM  Result Value Ref Range Status   Specimen Description BLOOD LEFT HAND  Final   Special Requests BOTTLES DRAWN AEROBIC ONLY 8CC  Final   Culture  Setup Time   Final    07/28/2014 15:42 Performed at [facility]    Culture   Final    NO GROWTH 5 DAYS Performed at [facility]    Report Status 08/03/2014 FINAL  Final  Clostridium Difficile by PCR     Status: None   Collection Time: 07/30/14  8:06 AM  Result Value Ref Range Status   C difficile by pcr NEGATIVE NEGATIVE Final  Culture, blood (routine x 2)     Status: None (Preliminary result)   Collection Time: 08/08/14  9:40 AM  Result Value Ref Range Status   Specimen Description BLOOD LEFT ARM  Final   Special Requests BOTTLES DRAWN AEROBIC ONLY 2CC  Final   Culture  Setup Time   Final    08/08/2014 15:37 Performed at [facility]    Culture   Final           BLOOD CULTURE RECEIVED NO GROWTH TO DATE CULTURE WILL BE HELD FOR 5 DAYS BEFORE ISSUING A FINAL NEGATIVE REPORT Performed at [facility]    Report Status PENDING  Incomplete  Culture, blood (routine x 2)     Status: None (Preliminary result)   Collection Time: 08/08/14 10:20 AM  Result Value Ref Range Status   Specimen Description BLOOD LEFT HAND  Final   Special Requests BOTTLES  DRAWN AEROBIC AND ANAEROBIC 5CC  Final   Culture  Setup Time   Final    08/08/2014 19:28 Performed at [facility]    Culture   Final    YEAST Note: Culture results may be compromised  due to an excessive volume of blood received in culture bottles. Gram Stain Report Called to,Read Back By and Verified With: MARK FIBENGE ON 08/09/2014 AT 9:48P BY Serafina MitchellWILEJ Performed at [facility]    Report Status PENDING  Incomplete  Culture, Urine     Status: None   Collection Time: 08/08/14 10:28 AM  Result Value Ref Range Status   Specimen Description URINE, CATHETERIZED  Final   Special Requests NONE  Final   Culture  Setup Time   Final    08/08/2014 20:36 Performed at [facility]    Colony Count   Final    >=100,000 COLONIES/ML Performed at [facility]    Culture YEAST Performed at [facility]   Final   Report Status 08/10/2014 FINAL  Final  Culture, respiratory (NON-Expectorated)     Status: None   Collection Time: 08/08/14 11:21 AM  Result Value Ref Range Status   Specimen Description TRACHEAL ASPIRATE  Final   Special Requests Normal  Final   Gram Stain   Final    MODERATE WBC PRESENT,BOTH PMN AND MONONUCLEAR RARE SQUAMOUS EPITHELIAL CELLS PRESENT NO ORGANISMS SEEN Performed at [facility]    Culture   Final    Non-Pathogenic Oropharyngeal-type Flora Isolated. Performed at [facility]    Report Status 08/11/2014 FINAL  Final  Culture, blood (routine x 2)     Status: None (Preliminary result)   Collection Time: 08/10/14  5:00 PM  Result Value Ref Range Status   Specimen Description BLOOD LEFT ARM  Final   Special Requests BOTTLES DRAWN AEROBIC ONLY 10CC  Final   Culture  Setup Time   Final    08/11/2014 00:46 Performed at [facility]    Culture   Final           BLOOD CULTURE RECEIVED NO GROWTH TO DATE CULTURE WILL BE HELD FOR 5 DAYS BEFORE ISSUING A FINAL NEGATIVE REPORT Performed at [facility]    Report Status PENDING  Incomplete  Culture, blood (routine x 2)     Status: None (Preliminary result)   Collection Time: 08/10/14  5:05 PM  Result Value Ref Range Status   Specimen Description BLOOD LEFT ARM  Final   Special Requests BOTTLES DRAWN AEROBIC ONLY 2CC  Final   Culture  Setup Time   Final    08/11/2014 00:46 Performed at [facility]    Culture   Final           BLOOD CULTURE RECEIVED NO GROWTH TO DATE CULTURE WILL BE HELD FOR 5 DAYS BEFORE ISSUING A FINAL NEGATIVE REPORT Performed at [facility]    Report Status PENDING  Incomplete   Imipenem day # 5/7 for HCAP.  Diflucan day # 4/14 minimum for candidemia. Vancomycin stopped 11/18 for ? Drug rash.  CVL pulled 11/16; PICC pulled 11/18 and new PICC placed on opposite arm.  Afebrile.  WBC 10.7, creat 0.8. Repeat BC no growth x 1 day.  Awaiting ID of yeast.  TEE done but not read.     Plan:  continue imipenem 500 mg IV q8h Continue Fluconazole 400mg  IV daily.   Herby AbrahamMichelle T. Alanya Vukelich, Pharm.D. [phone] 08/12/2014 10:50 AM

## 2014-08-12 NOTE — Progress Notes (Signed)
PULMONARY / CRITICAL CARE MEDICINE   Name: Stephanie SilvasVicky D Reilly MRN: [id number] DOB: [birth year]    ADMISSION DATE:  07/26/2014 CONSULTATION DATE:  07/26/2014  REFERRING MD :  Dr. Franky Machoabbell  CHIEF COMPLAINT:  Headache  INITIAL PRESENTATION: 63 year old female with no known medical history [facility]2 with severe headache, found to have SAH. AMS in ED requiring intubation. [facility]. PCCM consulted for vent and ICU management.  STUDIES:  11/2 CT head > diffuse SAH, frontal and temporal bilaterally.  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 Status post anterior communicating artery aneurysm clipping; frontal lobe edema stable, small volume intraventricular hemorrhage stable, regression of SAH, no new intracranial abnormality  SIGNIFICANT EVENTS: 11/2[facility]- [facility], Intubated for airway protection, [facility]- Right pterional craniotomy for clipping of anterior communicating artery aneurysm, complex 11/5 - no cuff leak >> started steroids 11/7 not weaning , lowest ps was 12 11/12 trach > 11/14 hgb 6.6, no active sign of bleeding 11/15 worsening renal failure, vasopressin and neo added for BP goals 11/17 Hb 6.7 - 1 U. Afebrile. Excellent UO. More responsive 08/11/14: ID recommending TTE and PICC line changed to left  side. Continues to be on trach and vent. Anasarca +. Low grade fever 9F +    SUBJECTIVE/OVERNIGHT/INTERVAL HX 08/12/14 _ bilateral thigh rash/redness with eedema - ID stopped vancomycin and monitoring. Low grade fever +. Edema improved with lasix. Na holding at 156 with free water correction. Fails wean  VITAL SIGNS: Temp:  [99.5 F (37.5  C)-100 F (37.8 C)] 99.5 F (37.5 C) (11/19 0900) Pulse Rate:  [30-132] 98 (11/19 0900) Resp:  [0-31] 24 (11/19 0900) BP: (104-156)/(34-74) 105/54 mmHg (11/19 0900) SpO2:  [78 %-100 %] 99 % (11/19 0900) FiO2 (%):  [40 %] 40 % (11/19 0840) Weight:  [150 lb 5.7 oz (68.2 kg)] 150 lb 5.7 oz (68.2 kg) (11/19 0440)   HEMODYNAMICS:    VENTILATOR SETTINGS: Vent Mode:  [-] PRVC FiO2 (%):  [40 %] 40 % Set Rate:  [30 bmp] 30 bmp Vt Set:  [370 mL] 370 mL PEEP:  [5 cmH20] 5 cmH20 Pressure Support:  [20 cmH20] 20 cmH20 Plateau Pressure:  [22 cmH20-28 cmH20] 22 cmH20  INTAKE / OUTPUT:  Intake/Output Summary (Last 24 hours) at 08/12/14 1050 Last data filed at 08/12/14 0800  Gross per 24 hour  Intake   2250 ml  Output   5025 ml  Net  -2775 ml    PHYSICAL EXAMINATION:  Gen: on vent HEENT: scalp incision c/d/i, trach in place PULM: decreased BS, crackles bilaterally CV: Tachy regular, no mgr Ab: BS+, soft Ext: massive edema/anasarca Derm: no breakdown but has redness bilateral anterior thigh Neuro:  RASS -2 equivalent  LABS: PULMONARY  Recent Labs Lab 08/07/14 1700 08/07/14 1928 08/07/14 2238 08/08/14 0226 08/08/14 1534  PHART 7.228* 7.174* 7.162* 7.367 7.222*  PCO2ART 48.5* 55.8* 56.8* 27.1* 40.8  PO2ART 122.0* 102.0* 91.0 28.0* 128.0*  HCO3 19.5* 20.5 20.4 19.6* 16.2*  TCO2 21.0 22 22 21  17.4  O2SAT 98.4 96.0 94.0 92.0 98.3    CBC  Recent Labs Lab 08/10/14 1202 08/11/14 0600 08/12/14 0600  HGB 8.3* 7.6* 7.9*  HCT  26.0* 24.0* 25.1*  WBC 13.0* 9.2 10.9*  PLT 387 341 329    COAGULATION No results for input(s): INR in the last 168 hours.  CARDIAC    Recent Labs Lab 08/08/14 0938  TROPONINI <0.30    Recent Labs Lab 08/12/14 0500  PROBNP 1422.0*     CHEMISTRY  Recent Labs Lab 08/06/14 0606 08/07/14 0450  08/11/14 0600 08/11/14 1215 08/11/14 1825 08/12/14 08/12/14 0600  NA 137 141  < > 157* 157* 156* 154* 156*  K 3.9 3.9  < > 3.6* 4.2  3.7 3.8 3.8  CL 103 107  < > 127* 125* 121* 120* 122*  CO2 19 21  < > 20 21 22 22 22   GLUCOSE 149* 217*  < > 90 115* 153* 125* 156*  BUN 16 17  < > 13 14 16 17 18   CREATININE 1.17* 1.03  < > 0.89 0.87 0.91 0.94 0.90  CALCIUM 8.9 8.9  < > 9.2 9.3 9.3 9.2 9.0  MG 1.9 2.0  --  2.2  --   --   --  2.0  PHOS 3.2 3.1  --  2.4  --   --   --  3.2  < > = values in this interval not displayed. Estimated Creatinine Clearance: 53.7 mL/min (by C-G formula based on Cr of 0.9).   LIVER No results for input(s): AST, ALT, ALKPHOS, BILITOT, PROT, ALBUMIN, INR in the last 168 hours.   INFECTIOUS No results for input(s): LATICACIDVEN, PROCALCITON in the last 168 hours.   ENDOCRINE CBG (last 3)   Recent Labs  08/11/14 2355 08/12/14 0406 08/12/14 0810  GLUCAP 110* 129* 136*         IMAGING x48h No results found.     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 Fentanyl gtt for analgesia/sedation-transition to intermittent soon Nimodipine x 6 wks Started statin 11/4    PULMONARY OETT 11/2 >>>trach   A: Acute hypoxemic respiratory failure > HCAP complicated by pulmonary edema  COPD, not in exacerbation. Concern for upper airway edema s/p tracheostomy 11/12   -still  unable to tolerate SBT due to anxiety, deconditioning and wasting  P:   Resume SBTs as tolerated Full vent support   CARDIOVASCULAR Rt IJ CVL 11/03 >> A:  HHH therapy in setting of SAH -off pressors Septic shock    - off 3% saline as of 08/11/14 and is on Free water 200mL Q4h for hypernatremia. Has anasarca + that is improved 08/12/14 after 1 dose lasix 08/11/14. BNP 1400s. Dx is VOlume overload and not cHF  P:  Dc cvp Continue free water Lasix 20mg  - change to  daily Check bnp  RENAL  Recent Labs Lab 08/11/14 1825 08/12/14 08/12/14 0600  NA 156* 154* 156*    Recent Labs Lab 08/11/14 1825 08/12/14 08/12/14 0600  K 3.7 3.8 3.8   A:   hypernatremia - na high AKI > resolved  P:   Start and cntinue diuresis since 08/11/14   GASTROINTESTINAL A:   Nutrition Ileus P:   Tube feeds  Increase to 30/h - reglan x 6 doses SUP: pepcid  HEMATOLOGIC A:   Anemia of critical illness >  Transfused 1 U PRBC 11/14 and 07/30/14 Leukocytosis.  P: F/u CBC: PRBC for hgb <7gm%Only SCD for DVT prevention  INFECTIOUS\  Blood 11/02 >>NTD Blood 11/03 >>NTD Blood 11/04 >>NTD C diff 11/06 >> neg Sputum 11/2>>neg UC 11-2>>neg ==== Blood 11/15 >>yeast >> Urine 11/15 >> yeast  Resp 11/15 >> .............. Blood 08/10/14 >>  A:   Sepsis 11/04 > no clear source, treated with broad spectrum Abx for possible PNA Septic shock 11/15 due to fungemia  P:   Pulled  Central line (31 days old) 08/09/14 Changed R Picc to left Picc 08/11/14  anbitiocs Vanc 07/29/14 >. 08/11/14 (rash) Continue Imipenem 11/15 >  Continue  diflucan 08/09/14  - low threshold to broaden to micafungin if clinical deterioration Await  TTE - and depending on course TEE  ENDOCRINE A:   No acute issues P:   SSI while on tube feeds  FAMILY  - Updates: No family bedside  TODAY'S SUMMARY: Fungemia , beyond danger period for vasospasm, High Na and  Diuresis due to high BNP  The patient is critically ill with multiple organ systems failure and requires high complexity decision making for assessment and support, frequent evaluation and titration of therapies, application of advanced monitoring technologies and extensive interpretation of multiple databases.   Critical Care Time devoted to patient care services described in this note is  30  Minutes. This time reflects time of care of this signee Dr Kalman Shan. This critical care time does not reflect procedure time, or  teaching time or supervisory time of PA/NP/Med student/Med Resident etc but could involve care discussion time    Dr. Kalman Shan, M.D., [facility].C.P Pulmonary and Critical Care Medicine Staff Physician [facility] [place] [facility] Pager: [phone], If no answer or between  15:00h - 7:00h: call [phone]  08/12/2014 10:50 AM

## 2014-08-13 DIAGNOSIS — J189 Pneumonia, unspecified organism: Secondary | ICD-10-CM | POA: Diagnosis not present

## 2014-08-13 LAB — CBC WITH DIFFERENTIAL/PLATELET
BASOS ABS: 0 10*3/uL (ref 0.0–0.1)
Basophils Relative: 0 % (ref 0–1)
EOS ABS: 0.8 10*3/uL — AB (ref 0.0–0.7)
Eosinophils Relative: 7 % — ABNORMAL HIGH (ref 0–5)
HCT: 26.2 % — ABNORMAL LOW (ref 36.0–46.0)
Hemoglobin: 8.1 g/dL — ABNORMAL LOW (ref 12.0–15.0)
LYMPHS ABS: 1 10*3/uL (ref 0.7–4.0)
Lymphocytes Relative: 9 % — ABNORMAL LOW (ref 12–46)
MCH: 27.2 pg (ref 26.0–34.0)
MCHC: 30.9 g/dL (ref 30.0–36.0)
MCV: 87.9 fL (ref 78.0–100.0)
Monocytes Absolute: 1.1 10*3/uL — ABNORMAL HIGH (ref 0.1–1.0)
Monocytes Relative: 9 % (ref 3–12)
Neutro Abs: 8.9 10*3/uL — ABNORMAL HIGH (ref 1.7–7.7)
Neutrophils Relative %: 75 % (ref 43–77)
PLATELETS: 319 10*3/uL (ref 150–400)
RBC: 2.98 MIL/uL — ABNORMAL LOW (ref 3.87–5.11)
RDW: 16.8 % — AB (ref 11.5–15.5)
WBC: 11.9 10*3/uL — AB (ref 4.0–10.5)

## 2014-08-13 LAB — BASIC METABOLIC PANEL
Anion gap: 11 (ref 5–15)
Anion gap: 10 (ref 5–15)
BUN: 19 mg/dL (ref 6–23)
BUN: 20 mg/dL (ref 6–23)
CALCIUM: 9.1 mg/dL (ref 8.4–10.5)
CO2: 27 mEq/L (ref 19–32)
CO2: 26 meq/L (ref 19–32)
CREATININE: 0.84 mg/dL (ref 0.50–1.10)
CREATININE: 0.89 mg/dL (ref 0.50–1.10)
Calcium: 8.9 mg/dL (ref 8.4–10.5)
Chloride: 115 mEq/L — ABNORMAL HIGH (ref 96–112)
Chloride: 115 mEq/L — ABNORMAL HIGH (ref 96–112)
GFR calc Af Amer: 83 mL/min — ABNORMAL LOW (ref >90)
GFR calc Af Amer: 77 mL/min — ABNORMAL LOW (ref >90)
GFR, EST NON AFRICAN AMERICAN: 71 mL/min — AB (ref >90)
GFR, EST NON AFRICAN AMERICAN: 67 mL/min — AB (ref >90)
GLUCOSE: 134 mg/dL — AB (ref 70–99)
Glucose, Bld: 130 mg/dL — ABNORMAL HIGH (ref 70–99)
Potassium: 3.3 mEq/L — ABNORMAL LOW (ref 3.7–5.3)
Potassium: 3.7 mEq/L (ref 3.7–5.3)
Sodium: 153 mEq/L — ABNORMAL HIGH (ref 137–147)
Sodium: 151 mEq/L — ABNORMAL HIGH (ref 137–147)

## 2014-08-13 LAB — GLUCOSE, CAPILLARY
GLUCOSE-CAPILLARY: 125 mg/dL — AB (ref 70–99)
Glucose-Capillary: 103 mg/dL — ABNORMAL HIGH (ref 70–99)
Glucose-Capillary: 109 mg/dL — ABNORMAL HIGH (ref 70–99)
Glucose-Capillary: 125 mg/dL — ABNORMAL HIGH (ref 70–99)
Glucose-Capillary: 125 mg/dL — ABNORMAL HIGH (ref 70–99)
Glucose-Capillary: 136 mg/dL — ABNORMAL HIGH (ref 70–99)
Glucose-Capillary: 147 mg/dL — ABNORMAL HIGH (ref 70–99)

## 2014-08-13 LAB — MAGNESIUM: MAGNESIUM: 1.9 mg/dL (ref 1.5–2.5)

## 2014-08-13 LAB — PHOSPHORUS: Phosphorus: 3.6 mg/dL (ref 2.3–4.6)

## 2014-08-13 MED ORDER — MAGNESIUM SULFATE 2 GM/50ML IV SOLN
2.0000 g | Freq: Once | INTRAVENOUS | Status: AC
  Administered 2014-08-13: 2 g via INTRAVENOUS
  Filled 2014-08-13: qty 50

## 2014-08-13 MED ORDER — SODIUM CHLORIDE 0.9 % IV SOLN
250.0000 mg | Freq: Four times a day (QID) | INTRAVENOUS | Status: AC
  Administered 2014-08-13 – 2014-08-14 (×6): 250 mg via INTRAVENOUS
  Filled 2014-08-13 (×10): qty 250

## 2014-08-13 MED ORDER — POTASSIUM CHLORIDE 20 MEQ/15ML (10%) PO SOLN
40.0000 meq | Freq: Once | ORAL | Status: AC
  Administered 2014-08-13: 40 meq
  Filled 2014-08-13 (×2): qty 30

## 2014-08-13 MED ORDER — FREE WATER
300.0000 mL | Status: DC
  Administered 2014-08-13 – 2014-08-26 (×78): 300 mL

## 2014-08-13 NOTE — Progress Notes (Signed)
[facility]    Date of Admission:  07/26/2014   Total days of antibiotics 15        Day 6 imi        Day 5 fluconazole   ID: Stephanie Doyle is a 63 y.o. female  with SAH s/p right frontotemporal craniotomy and anterior communicating artery aneurysm clipping develops leukocytosis in setting of ongoing respiratory distress concern for hcap, found to have have fungemia. Principal Problem:   SAH (subarachnoid hemorrhage) Active Problems:   Acute respiratory failure   Absolute anemia   Subarachnoid hemorrhage   Hypomagnesemia   Hypophosphatemia   Acute on chronic respiratory failure   Fungemia    Subjective: Afebrile, still has diffuse erythamatous rash to chest and thighs bilaterally. Still tachypneic with vent wean attempts   Medications:  . antiseptic oral rinse  7 mL Mouth Rinse QID  . chlorhexidine  15 mL Mouth Rinse BID  . famotidine  20 mg Per Tube Q12H  . feeding supplement (PRO-STAT SUGAR FREE 64)  30 mL Per Tube Daily  . fluconazole (DIFLUCAN) IV  400 mg Intravenous Q24H  . free water  200 mL Per Tube Q4H  . furosemide  20 mg Intravenous Daily  . imipenem-cilastatin  250 mg Intravenous Q6H  . insulin aspart  0-20 Units Subcutaneous 6 times per day  . ipratropium-albuterol  3 mL Nebulization Q4H  . levETIRAcetam  500 mg Per Tube BID  . multivitamin  5 mL Per Tube Daily  . NiMODipine  60 mg Oral 6 times per day  . simvastatin  40 mg Oral q1800  . sodium chloride  10-40 mL Intracatheter Q12H    Objective: Vital signs in last 24 hours: Temp:  [99.3 F (37.4 C)-99.9 F (37.7 C)] 99.3 F (37.4 C) (11/20 0800) Pulse Rate:  [82-125] 103 (11/20 0800) Resp:  [11-37] 30 (11/20 0800) BP: (94-165)/(37-102) 128/64 mmHg (11/20 0800) SpO2:  [95 %-100 %] 95 % (11/20 0800) FiO2 (%):  [40 %] 40 % (11/20 0800) Weight:  [148 lb 13 oz (67.5 kg)] 148 lb 13 oz (67.5 kg) (11/20 0414) Physical Exam  Constitutional:  Opens eyes to name.open mouth and  follows command. Moves all extremities spontaneously. No distress.  HENT: staples on crani incision is clean Mouth/Throat: trach in place Cardiovascular: Normal rate, regular rhythm and normal heart sounds. Exam reveals no gallop and no friction rub.  No murmur heard.  Chest wall = diffuse blanching erythamatous rash Pulmonary/Chest: Effort normal and breath sounds normal. No respiratory distress.  has no wheezes.  Abdominal: Soft. Bowel sounds are normal.  exhibits no distension. There is no tenderness.  Skin: diffuse red rash upper thigh/inguinal area. Ext: diffuse anasarca   Lab Results  Recent Labs  08/12/14 0600 08/12/14 1200 08/13/14 0440  WBC 10.9*  --  11.9*  HGB 7.9*  --  8.1*  HCT 25.1*  --  26.2*  NA 156* 155*  --   K 3.8 3.8  --   CL 122* 120*  --   CO2 22 23  --   BUN 18 19  --   CREATININE 0.90 0.85  --    Liver Panel No results for input(s): PROT, ALBUMIN, AST, ALT, ALKPHOS, BILITOT, BILIDIR, IBILI in the last 72 hours. Sedimentation Rate No results for input(s): ESRSEDRATE in the last 72 hours. C-Reactive Protein No results for input(s): CRP in the last 72 hours.  Microbiology: 11/15 blood cx 1 of 2 yeast 11/15  resp cx NOPF 11/17 blood cx ngtd  Studies/Results: No results found.   Assessment/Plan: Fungemia = currently on day 5 of 14. Repeat blood cx on 11/17 is NGTD, indwelling line pulled. TTE did not show any vegetation. Recommend to do 14 day treatment course of fluconazole 400mg  daily. Identification still pending after discussion with micro lab today  Rash = likely drug rash. Likely associated with vancomycin.still present but can take up to 7-10 days to resolve. It also may be due to imipenem, but will finish course on saturday  Pneumonia/HCAP = she is on day 6 of 7 of hcap, with imipenem. Plan on stopping at end of 11/21.  Drue SecondSNIDER, [facility] Cell: [phone] Pager: [phone]  08/13/2014, 9:52  AM

## 2014-08-13 NOTE — Progress Notes (Signed)
Pt seen and examined. No issues overnight.   EXAM: Temp:  [99.1 F (37.3 C)-99.9 F (37.7 C)] 99.9 F (37.7 C) (11/20 1400) Pulse Rate:  [87-122] 105 (11/20 1400) Resp:  [11-33] 30 (11/20 1400) BP: (94-143)/(37-76) 105/68 mmHg (11/20 1400) SpO2:  [95 %-100 %] 100 % (11/20 1400) FiO2 (%):  [40 %] 40 % (11/20 1400) Weight:  [67.5 kg (148 lb 13 oz)] 67.5 kg (148 lb 13 oz) (11/20 0414) Intake/Output      11/19 0701 - 11/20 0700 11/20 0701 - 11/21 0700   I.V. (mL/kg) 465 (6.9) 135 (2)   NG/GT 2120 680   IV Piggyback 500 100   Total Intake(mL/kg) 3085 (45.7) 915 (13.6)   Urine (mL/kg/hr) 1450 (0.9)    Drains 2175 (1.3) 2450 (4.7)   Stool 150 (0.1)    Total Output 3775 2450   Net -690 -1535        Stool Occurrence 1 x 1 x    Awake, alert, mouthing words Responds appropriately to questions Moving RUE off bed, moves RLE ~2/5 2/5 LUE. No significant voluntary movement LLE Wound c/d/i  LABS: Lab Results  Component Value Date   CREATININE 0.84 08/13/2014   BUN 19 08/13/2014   NA 153* 08/13/2014   K 3.3* 08/13/2014   CL 115* 08/13/2014   CO2 27 08/13/2014   Lab Results  Component Value Date   WBC 11.9* 08/13/2014   HGB 8.1* 08/13/2014   HCT 26.2* 08/13/2014   MCV 87.9 08/13/2014   PLT 319 08/13/2014    IMPRESSION: - 63 y.o. female SAH d# 18 s/p Acom clipping - Improving neurologically - Fungemia - Hypernatremia  PLAN: - Nimotop/Zocor till Mon - Cont free H2O through PEG, monitor serum Na - On fluconazole for fungemia

## 2014-08-13 NOTE — Progress Notes (Signed)
ANTIBIOTIC CONSULT NOTE - FOLLOW UP  Pharmacy Consult for Primaxin + Fluconazole Indication: Fungemia + HCAP  No Known Allergies  Patient Measurements: Height: 5' (152.4 cm) Weight: 148 lb 13 oz (67.5 kg) IBW/kg (Calculated) : 45.5  Vital Signs: Temp: 99.3 F (37.4 C) (11/20 0800) BP: 128/64 mmHg (11/20 0800) Pulse Rate: 103 (11/20 0800) Intake/Output from previous day: 11/19 0701 - 11/20 0700 In: 3085 [I.V.:465; NG/GT:2120; IV Piggyback:500] Out: 3775 [Urine:1450; Drains:2175; Stool:150] Intake/Output from this shift: Total I/O In: 70 [I.V.:30; NG/GT:40] Out: 150 [Drains:150]  Labs:  Recent Labs  08/11/14 0600  08/12/14 08/12/14 0600 08/12/14 1200 08/13/14 0440  WBC 9.2  --   --  10.9*  --  11.9*  HGB 7.6*  --   --  7.9*  --  8.1*  PLT 341  --   --  329  --  319  CREATININE 0.89  < > 0.94 0.90 0.85  --   < > = values in this interval not displayed. Estimated Creatinine Clearance: 56.6 mL/min (by C-G formula based on Cr of 0.85). No results for input(s): VANCOTROUGH, VANCOPEAK, VANCORANDOM, GENTTROUGH, GENTPEAK, GENTRANDOM, TOBRATROUGH, TOBRAPEAK, TOBRARND, AMIKACINPEAK, AMIKACINTROU, AMIKACIN in the last 72 hours.   Microbiology: Recent Results (from the past 720 hour(s))  MRSA PCR Screening     Status: None   Collection Time: 07/26/14  7:58 PM  Result Value Ref Range Status   MRSA by PCR NEGATIVE NEGATIVE Final    Comment:        The GeneXpert MRSA Assay (FDA approved for NASAL specimens only), is one component of a comprehensive MRSA colonization surveillance program. It is not intended to diagnose MRSA infection nor to guide or monitor treatment for MRSA infections.   Culture, Urine     Status: None   Collection Time: 07/26/14 11:12 PM  Result Value Ref Range Status   Specimen Description URINE, CATHETERIZED  Final   Special Requests NONE  Final   Culture  Setup Time   Final    07/27/2014 10:07 Performed at [facility]    Colony Count  NO GROWTH Performed at [facility]   Final   Culture NO GROWTH Performed at [facility]   Final   Report Status 07/28/2014 FINAL  Final  Culture, blood (routine x 2)     Status: None   Collection Time: 07/26/14 11:49 PM  Result Value Ref Range Status   Specimen Description BLOOD LEFT ARM  Final   Special Requests BOTTLES DRAWN AEROBIC ONLY 5CC  Final   Culture  Setup Time   Final    07/27/2014 08:58 Performed at [facility]    Culture   Final    NO GROWTH 5 DAYS Performed at [facility]    Report Status 08/02/2014 FINAL  Final  Culture, respiratory (NON-Expectorated)     Status: None   Collection Time: 07/26/14 11:52 PM  Result Value Ref Range Status   Specimen Description TRACHEAL ASPIRATE  Final   Special Requests NONE  Final   Gram Stain   Final    RARE WBC PRESENT, PREDOMINANTLY PMN RARE SQUAMOUS EPITHELIAL CELLS PRESENT RARE GRAM POSITIVE COCCI IN PAIRS Performed at [facility]    Culture   Final    Non-Pathogenic Oropharyngeal-type Flora Isolated. Performed at [facility]    Report Status 07/29/2014 FINAL  Final  Culture, blood (routine x 2)     Status: None   Collection Time: 07/27/14 12:04 AM  Result  Value Ref Range Status   Specimen Description BLOOD LEFT HAND  Final   Special Requests BOTTLES DRAWN AEROBIC ONLY 4CC  Final   Culture  Setup Time   Final    07/27/2014 08:58 Performed at [facility]    Culture   Final    NO GROWTH 5 DAYS Performed at [facility]    Report Status 08/02/2014 FINAL  Final  Culture, respiratory (NON-Expectorated)     Status: None   Collection Time: 07/28/14  8:49 AM  Result Value Ref Range Status   Specimen Description TRACHEAL ASPIRATE  Final   Special Requests Normal  Final   Gram Stain   Final    FEW WBC PRESENT,BOTH PMN AND MONONUCLEAR NO SQUAMOUS EPITHELIAL CELLS SEEN NO ORGANISMS SEEN Performed at [facility]    Culture   Final     Non-Pathogenic Oropharyngeal-type Flora Isolated. Performed at [facility]    Report Status 07/30/2014 FINAL  Final  Culture, blood (routine x 2)     Status: None   Collection Time: 07/28/14  9:43 AM  Result Value Ref Range Status   Specimen Description BLOOD LEFT HAND  Final   Special Requests BOTTLES DRAWN AEROBIC ONLY 10CC  Final   Culture  Setup Time   Final    07/28/2014 15:42 Performed at [facility]    Culture   Final    NO GROWTH 5 DAYS Performed at [facility]    Report Status 08/03/2014 FINAL  Final  Culture, blood (routine x 2)     Status: None   Collection Time: 07/28/14 10:00 AM  Result Value Ref Range Status   Specimen Description BLOOD LEFT HAND  Final   Special Requests BOTTLES DRAWN AEROBIC ONLY 8CC  Final   Culture  Setup Time   Final    07/28/2014 15:42 Performed at [facility]    Culture   Final    NO GROWTH 5 DAYS Performed at [facility]    Report Status 08/03/2014 FINAL  Final  Clostridium Difficile by PCR     Status: None   Collection Time: 07/30/14  8:06 AM  Result Value Ref Range Status   C difficile by pcr NEGATIVE NEGATIVE Final  Culture, blood (routine x 2)     Status: None (Preliminary result)   Collection Time: 08/08/14  9:40 AM  Result Value Ref Range Status   Specimen Description BLOOD LEFT ARM  Final   Special Requests BOTTLES DRAWN AEROBIC ONLY 2CC  Final   Culture  Setup Time   Final    08/08/2014 15:37 Performed at [facility]    Culture   Final           BLOOD CULTURE RECEIVED NO GROWTH TO DATE CULTURE WILL BE HELD FOR 5 DAYS BEFORE ISSUING A FINAL NEGATIVE REPORT Performed at [facility]    Report Status PENDING  Incomplete  Culture, blood (routine x 2)     Status: None (Preliminary result)   Collection Time: 08/08/14 10:20 AM  Result Value Ref Range Status   Specimen Description BLOOD LEFT HAND  Final   Special Requests BOTTLES DRAWN AEROBIC AND ANAEROBIC 5CC   Final   Culture  Setup Time   Final    08/08/2014 19:28 Performed at [facility]    Culture   Final    YEAST Note: Culture results may be compromised due to an excessive volume of blood received in culture bottles.  Gram Stain Report Called to,Read Back By and Verified With: MARK FIBENGE ON 08/09/2014 AT 9:48P BY Serafina Mitchell Performed at [facility]    Report Status PENDING  Incomplete  Culture, Urine     Status: None   Collection Time: 08/08/14 10:28 AM  Result Value Ref Range Status   Specimen Description URINE, CATHETERIZED  Final   Special Requests NONE  Final   Culture  Setup Time   Final    08/08/2014 20:36 Performed at [facility]    Colony Count   Final    >=100,000 COLONIES/ML Performed at [facility]    Culture YEAST Performed at [facility]   Final   Report Status 08/10/2014 FINAL  Final  Culture, respiratory (NON-Expectorated)     Status: None   Collection Time: 08/08/14 11:21 AM  Result Value Ref Range Status   Specimen Description TRACHEAL ASPIRATE  Final   Special Requests Normal  Final   Gram Stain   Final    MODERATE WBC PRESENT,BOTH PMN AND MONONUCLEAR RARE SQUAMOUS EPITHELIAL CELLS PRESENT NO ORGANISMS SEEN Performed at [facility]    Culture   Final    Non-Pathogenic Oropharyngeal-type Flora Isolated. Performed at [facility]    Report Status 08/11/2014 FINAL  Final  Culture, blood (routine x 2)     Status: None (Preliminary result)   Collection Time: 08/10/14  5:00 PM  Result Value Ref Range Status   Specimen Description BLOOD LEFT ARM  Final   Special Requests BOTTLES DRAWN AEROBIC ONLY 10CC  Final   Culture  Setup Time   Final    08/11/2014 00:46 Performed at [facility]    Culture   Final           BLOOD CULTURE RECEIVED NO GROWTH TO DATE CULTURE WILL BE HELD FOR 5 DAYS BEFORE ISSUING A FINAL NEGATIVE REPORT Performed at [facility]    Report Status PENDING   Incomplete  Culture, blood (routine x 2)     Status: None (Preliminary result)   Collection Time: 08/10/14  5:05 PM  Result Value Ref Range Status   Specimen Description BLOOD LEFT ARM  Final   Special Requests BOTTLES DRAWN AEROBIC ONLY 2CC  Final   Culture  Setup Time   Final    08/11/2014 00:46 Performed at [facility]    Culture   Final           BLOOD CULTURE RECEIVED NO GROWTH TO DATE CULTURE WILL BE HELD FOR 5 DAYS BEFORE ISSUING A FINAL NEGATIVE REPORT Performed at [facility]    Report Status PENDING  Incomplete    Anti-infectives    Start     Dose/Rate Route Frequency Ordered Stop   08/10/14 2200  fluconazole (DIFLUCAN) IVPB 400 mg     400 mg100 mL/hr over 120 Minutes Intravenous Every 24 hours 08/09/14 2158     08/10/14 1000  micafungin (MYCAMINE) 100 mg in sodium chloride 0.9 % 100 mL IVPB  Status:  Discontinued     100 mg100 mL/hr over 1 Hours Intravenous Daily 08/10/14 0833 08/10/14 0833   08/09/14 2200  fluconazole (DIFLUCAN) IVPB 800 mg     800 mg200 mL/hr over 120 Minutes Intravenous  Once 08/09/14 2158 08/10/14 0059   08/09/14 1800  vancomycin (VANCOCIN) IVPB 750 mg/150 ml premix  Status:  Discontinued     750 mg150 mL/hr over 60 Minutes Intravenous Every 12 hours 08/09/14 0913 08/11/14 1510  08/09/14 1000  imipenem-cilastatin (PRIMAXIN) 500 mg in sodium chloride 0.9 % 100 mL IVPB     500 mg200 mL/hr over 30 Minutes Intravenous Every 8 hours 08/09/14 0911     08/08/14 1030  piperacillin-tazobactam (ZOSYN) IVPB 3.375 g  Status:  Discontinued     3.375 g12.5 mL/hr over 240 Minutes Intravenous Every 8 hours 08/08/14 0933 08/08/14 0935   08/08/14 1030  vancomycin (VANCOCIN) IVPB 1000 mg/200 mL premix     1,000 mg200 mL/hr over 60 Minutes Intravenous Every 24 hours 08/08/14 0933 08/09/14 1052   08/08/14 1030  imipenem-cilastatin (PRIMAXIN) 250 mg in sodium chloride 0.9 % 100 mL IVPB  Status:  Discontinued     250 mg200 mL/hr over 30 Minutes  Intravenous Every 6 hours 08/08/14 0938 08/09/14 0910   07/29/14 0930  vancomycin (VANCOCIN) IVPB 1000 mg/200 mL premix  Status:  Discontinued     1,000 mg200 mL/hr over 60 Minutes Intravenous Every 24 hours 07/28/14 0941 08/04/14 1026   07/28/14 0930  piperacillin-tazobactam (ZOSYN) IVPB 3.375 g  Status:  Discontinued     3.375 g12.5 mL/hr over 240 Minutes Intravenous Every 8 hours 07/28/14 0916 08/04/14 1026   07/28/14 0930  vancomycin (VANCOCIN) IVPB 1000 mg/200 mL premix     1,000 mg200 mL/hr over 60 Minutes Intravenous  Once 07/28/14 0916 07/28/14 1100   07/27/14 1704  bacitracin 50,000 Units in sodium chloride irrigation 0.9 % 500 mL irrigation  Status:  Discontinued       As needed 07/27/14 1704 07/27/14 2111   07/26/14 2300  ampicillin-sulbactam (UNASYN) 1.5 g in sodium chloride 0.9 % 50 mL IVPB  Status:  Discontinued     1.5 g100 mL/hr over 30 Minutes Intravenous Every 6 hours 07/26/14 2213 07/28/14 0422      Assessment: Stephanie Doyle who continues on fluconazole for fungemia thought to be due to line infection. Lines have been removed and replaced - ID is on board, repeat BCx thus far show clearance. The patient will need a minimum of 14 days of treatment. Will plan to check LFTs on 11/21 AM for monitoring with fluconazole.   The patient also continues on Primaxin D#6/7 for HCAP treatment. The patient's renal function is stable - however given the updated weight, will adjust the dose today. No stop date is yet in place - consider adding for 11/21.   Goal of Therapy:  Proper antibiotics for infection/cultures adjusted for renal/hepatic function   Plan:  1. Continue Fluconazole 400 mg IV every 24 hours 2. Adjust Primaxin to 250 mg IV every 6 hours 3. Will continue to follow renal function, culture results, LOT, and antibiotic/antifungal de-escalation plans   Georgina Pillion, PharmD, BCPS Clinical Pharmacist Pager: [phone] 08/13/2014 9:41 AM

## 2014-08-13 NOTE — Progress Notes (Signed)
eLink Physician-Brief Progress Note Patient Name: Stephanie SilvasVicky D Doyle DOB: 1951/01/10 MRN: [id number]   Date of Service  08/13/2014  HPI/Events of Note  Hypokalemia, 3.3  eICU Interventions  40meq KCL per tube     Intervention Category Intermediate Interventions: Electrolyte abnormality - evaluation and management  Declyn Offield 08/13/2014, 3:43 PM

## 2014-08-13 NOTE — Plan of Care (Signed)
Problem: Phase I Progression Outcomes Goal: Neuro exam at baseline or improved Outcome: Completed/Met Date Met:  08/13/14

## 2014-08-13 NOTE — Progress Notes (Signed)
PULMONARY / CRITICAL CARE MEDICINE   Name: Stephanie Doyle MRN: [id number] DOB: 1951/08/19    ADMISSION DATE:  07/26/2014 CONSULTATION DATE:  07/26/2014  REFERRING MD :  Dr. Franky Macho  CHIEF COMPLAINT:  Headache  INITIAL PRESENTATION: 63 year old female with no known medical history presented to [facility] 11/2 with severe headache, found to have SAH. AMS in ED requiring intubation. She was transferred to [facility] for further eval. PCCM consulted for vent and ICU management.  STUDIES:  11/2 CT head > diffuse SAH, frontal and temporal bilaterally.  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 Status post anterior communicating artery aneurysm clipping; frontal lobe edema stable, small volume intraventricular hemorrhage stable, regression of SAH, no new intracranial abnormality  SIGNIFICANT EVENTS: 11/2[facility], Intubated for airway protection, transferred to [facility] NICU 11/4- Right pterional craniotomy for clipping of anterior communicating artery aneurysm, complex 11/5 - no cuff leak >> started steroids 11/7 not weaning , lowest ps was 12 11/12 trach > 11/14 hgb 6.6, no active sign of bleeding 11/15 worsening renal failure, vasopressin and neo added for BP goals 11/17 Hb 6.7 - 1 U. Afebrile. Excellent UO. More responsive 08/11/14: ID recommending TTE and PICC line changed to left  side. Continues to be on trach and vent. Anasarca +. Low grade fever 20F + 08/12/14 _ bilateral thigh rash/redness with eedema - ID stopped vancomycin and monitoring. Low grade fever +. Edema improved with lasix. Na holding at 156 with free water correction. Fails wean   SUBJECTIVE/OVERNIGHT/INTERVAL HX 08/13/14: Appears more awake today and  smiled. Na reduced t 153. ECHO EF 65% with Moderate MR. No endocardiotis  VITAL SIGNS: Temp:  [99.1 F (37.3 C)-99.9 F (37.7 C)] 99.7 F (37.6 C) (11/20 1200) Pulse Rate:  [87-122] 118 (11/20 1200) Resp:  [11-33] 30 (11/20 1200) BP: (94-143)/(37-76) 126/45 mmHg (11/20 1200) SpO2:  [95 %-100 %] 100 % (11/20 1200) FiO2 (%):  [40 %] 40 % (11/20 1200) Weight:  [148 lb 13 oz (67.5 kg)] 148 lb 13 oz (67.5 kg) (11/20 0414)   HEMODYNAMICS:    VENTILATOR SETTINGS: Vent Mode:  [-] PRVC FiO2 (%):  [40 %] 40 % Set Rate:  [30 bmp] 30 bmp Vt Set:  [370 mL] 370 mL PEEP:  [5 cmH20] 5 cmH20 Plateau Pressure:  [21 cmH20-27 cmH20] 21 cmH20  INTAKE / OUTPUT:  Intake/Output Summary (Last 24 hours) at 08/13/14 1412 Last data filed at 08/13/14 1200  Gross per 24 hour  Intake   3015 ml  Output   5175 ml  Net  -2160 ml    PHYSICAL EXAMINATION:  Gen: on vent HEENT: scalp incision c/d/i, trach in place PULM: decreased BS, crackles bilaterally CV: Tachy regular, no mgr Ab: BS+, soft Ext: massive edema/anasarca Derm: no breakdown but has redness bilateral anterior thigh Neuro:  RASS 0 and smiled. Seems better  LABS: PULMONARY  Recent Labs Lab 08/07/14 1700 08/07/14 1928 08/07/14 2238 08/08/14 0226 08/08/14 1534  PHART 7.228* 7.174* 7.162* 7.367 7.222*  PCO2ART 48.5* 55.8* 56.8* 27.1* 40.8  PO2ART 122.0* 102.0* 91.0 28.0* 128.0*  HCO3 19.5* 20.5 20.4 19.6* 16.2*  TCO2 21.0 22 22 21  17.4  O2SAT 98.4 96.0 94.0 92.0 98.3    CBC  Recent Labs  Lab 08/11/14 0600 08/12/14 0600 08/13/14 0440  HGB 7.6* 7.9* 8.1*  HCT 24.0* 25.1* 26.2*  WBC 9.2 10.9* 11.9*  PLT 341 329 319    COAGULATION No results for input(s): INR in the last 168 hours.  CARDIAC    Recent Labs Lab 08/08/14 0938  TROPONINI <0.30    Recent Labs Lab 08/12/14 0500  PROBNP 1422.0*     CHEMISTRY  Recent Labs Lab 08/07/14 0450  08/11/14 0600  08/11/14 1825 08/12/14 08/12/14 0600  08/12/14 1200 08/13/14 0440 08/13/14 1133  NA 141  < > 157*  < > 156* 154* 156* 155*  --  153*  K 3.9  < > 3.6*  < > 3.7 3.8 3.8 3.8  --  3.3*  CL 107  < > 127*  < > 121* 120* 122* 120*  --  115*  CO2 21  < > 20  < > 22 22 22 23   --  27  GLUCOSE 217*  < > 90  < > 153* 125* 156* 151*  --  130*  BUN 17  < > 13  < > 16 17 18 19   --  19  CREATININE 1.03  < > 0.89  < > 0.91 0.94 0.90 0.85  --  0.84  CALCIUM 8.9  < > 9.2  < > 9.3 9.2 9.0 8.8  --  9.1  MG 2.0  --  2.2  --   --   --  2.0  --  1.9  --   PHOS 3.1  --  2.4  --   --   --  3.2  --  3.6  --   < > = values in this interval not displayed. Estimated Creatinine Clearance: 57.2 mL/min (by C-G formula based on Cr of 0.84).   LIVER No results for input(s): AST, ALT, ALKPHOS, BILITOT, PROT, ALBUMIN, INR in the last 168 hours.   INFECTIOUS No results for input(s): LATICACIDVEN, PROCALCITON in the last 168 hours.   ENDOCRINE CBG (last 3)   Recent Labs  08/13/14 0358 08/13/14 0753 08/13/14 1225  GLUCAP 109* 147* 125*         IMAGING x48h No results found.     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 Fentanyl gtt for analgesia/sedation-transition to intermittent soon Nimodipine x 6 wks; to end 08/16/14 per neurosurgery Started statin 11/4 ; to end 08/16/14 per neurosurgery   PULMONARY OETT 11/2 >>>trach   A: Acute hypoxemic respiratory failure > HCAP complicated by pulmonary edema  COPD, not in exacerbation. Concern for upper airway edema s/p tracheostomy 11/12   -still  unable to tolerate SBT due to anxiety, deconditioning and wasting  P:   Resume SBTs as tolerated Full vent support   CARDIOVASCULAR Rt IJ CVL 11/03 >> A:  Septic shock - off pressors HHH  therapy in setting of SAH -- off 3% saline as of 08/11/14   Hypernatremi. Has anasarca + . On combo free water and lasix.  New finding of moderate mitral regurg on echo 08/01/14    - 08/12/14 after 1 dose lasix 08/11/14. BNP 1400s. Dx is VOlume overload and not cHF - 08/13/14 - diurseing  Well. Na some better  P:  Continue free water Lasix  20mg  - change to daily   RENAL  A:   hypernatremia - na high AKI > resolved  P:   Start and cntinue diuresis since 08/11/14 Continue free water Replete mag   GASTROINTESTINAL A:   Nutrition Ileus P:   Tube feeds  Increase to 30/h - reglan x 6 doses SUP: pepcid  HEMATOLOGIC A:   Anemia of critical illness >  Transfused 1 U PRBC 11/14 and 07/30/14 Leukocytosis. P: F/u CBC: PRBC for hgb <7gm%Only SCD for DVT prevention  INFECTIOUS\  Blood 11/02 >>NTD Blood 11/03 >>NTD Blood 11/04 >>NTD C diff 11/06 >> neg Sputum 11/2>>neg UC 11-2>>neg ==== Blood 11/15 >>yeast >> Urine 11/15 >> yeast  Resp 11/15 >> .............. Blood 08/10/14 >>  A:   Sepsis 11/04 > no clear source, treated with broad spectrum Abx for possible PNA Septic shock 11/15 due to fungemia  P:   Pulled  Central line (5412 days old) 08/09/14 Changed R Picc to left Picc 08/11/14  anbitiocs Vanc 07/29/14 >. 08/11/14 (rash) Continue Imipenem 11/15 >  Continue  diflucan 08/09/14  - low threshold to broaden to micafungin if clinical deterioration Await  TTE - and depending on course TEE  ENDOCRINE A:   No acute issues P:   SSI while on tube feeds  FAMILY  - Updates: No family bedside  TODAY'S SUMMARY: Fungemia , beyond danger period for vasospasm, High Na and  Diuresis due to high BNP      Dr. Kalman ShanMurali Avira Tillison, M.D., [facility].C.C.P Pulmonary and Critical Care Medicine Staff Physician [facility] [place] [facility]monary [facility] Critical [facility] Pager: [phone], If no answer or between  15:00h - 7:00h: call [phone]  08/13/2014 2:12 PM

## 2014-08-14 DIAGNOSIS — J189 Pneumonia, unspecified organism: Secondary | ICD-10-CM

## 2014-08-14 LAB — CBC WITH DIFFERENTIAL/PLATELET
BASOS ABS: 0 10*3/uL (ref 0.0–0.1)
Basophils Relative: 0 % (ref 0–1)
Eosinophils Absolute: 0.7 10*3/uL (ref 0.0–0.7)
Eosinophils Relative: 7 % — ABNORMAL HIGH (ref 0–5)
HCT: 24.2 % — ABNORMAL LOW (ref 36.0–46.0)
Hemoglobin: 7.5 g/dL — ABNORMAL LOW (ref 12.0–15.0)
LYMPHS ABS: 1.2 10*3/uL (ref 0.7–4.0)
LYMPHS PCT: 14 % (ref 12–46)
MCH: 27.9 pg (ref 26.0–34.0)
MCHC: 31 g/dL (ref 30.0–36.0)
MCV: 90 fL (ref 78.0–100.0)
Monocytes Absolute: 0.5 10*3/uL (ref 0.1–1.0)
Monocytes Relative: 6 % (ref 3–12)
Neutro Abs: 6.3 10*3/uL (ref 1.7–7.7)
Neutrophils Relative %: 73 % (ref 43–77)
PLATELETS: 292 10*3/uL (ref 150–400)
RBC: 2.69 MIL/uL — AB (ref 3.87–5.11)
RDW: 16.7 % — AB (ref 11.5–15.5)
WBC: 8.8 10*3/uL (ref 4.0–10.5)

## 2014-08-14 LAB — BASIC METABOLIC PANEL
Anion gap: 13 (ref 5–15)
Anion gap: 13 (ref 5–15)
BUN: 19 mg/dL (ref 6–23)
BUN: 20 mg/dL (ref 6–23)
CALCIUM: 8.7 mg/dL (ref 8.4–10.5)
CHLORIDE: 114 meq/L — AB (ref 96–112)
CO2: 26 mEq/L (ref 19–32)
CO2: 26 mEq/L (ref 19–32)
CREATININE: 0.86 mg/dL (ref 0.50–1.10)
Calcium: 8.5 mg/dL (ref 8.4–10.5)
Chloride: 112 mEq/L (ref 96–112)
Creatinine, Ser: 0.83 mg/dL (ref 0.50–1.10)
GFR calc Af Amer: 80 mL/min — ABNORMAL LOW (ref >90)
GFR calc non Af Amer: 72 mL/min — ABNORMAL LOW (ref >90)
GFR, EST AFRICAN AMERICAN: 84 mL/min — AB (ref >90)
GFR, EST NON AFRICAN AMERICAN: 69 mL/min — AB (ref >90)
Glucose, Bld: 140 mg/dL — ABNORMAL HIGH (ref 70–99)
Glucose, Bld: 130 mg/dL — ABNORMAL HIGH (ref 70–99)
POTASSIUM: 3.5 meq/L — AB (ref 3.7–5.3)
POTASSIUM: 4.3 meq/L (ref 3.7–5.3)
SODIUM: 153 meq/L — AB (ref 137–147)
Sodium: 151 mEq/L — ABNORMAL HIGH (ref 137–147)

## 2014-08-14 LAB — PHOSPHORUS: PHOSPHORUS: 3.4 mg/dL (ref 2.3–4.6)

## 2014-08-14 LAB — AST: AST: 23 U/L (ref 0–37)

## 2014-08-14 LAB — GLUCOSE, CAPILLARY
GLUCOSE-CAPILLARY: 130 mg/dL — AB (ref 70–99)
GLUCOSE-CAPILLARY: 103 mg/dL — AB (ref 70–99)
Glucose-Capillary: 148 mg/dL — ABNORMAL HIGH (ref 70–99)
Glucose-Capillary: 135 mg/dL — ABNORMAL HIGH (ref 70–99)
Glucose-Capillary: 152 mg/dL — ABNORMAL HIGH (ref 70–99)

## 2014-08-14 LAB — MAGNESIUM: Magnesium: 2.2 mg/dL (ref 1.5–2.5)

## 2014-08-14 LAB — ALT: ALT: 20 U/L (ref 0–35)

## 2014-08-14 LAB — CLOSTRIDIUM DIFFICILE BY PCR: CDIFFPCR: NEGATIVE

## 2014-08-14 MED ORDER — POTASSIUM CHLORIDE 20 MEQ/15ML (10%) PO SOLN
40.0000 meq | Freq: Three times a day (TID) | ORAL | Status: AC
  Administered 2014-08-14 (×2): 40 meq
  Filled 2014-08-14 (×2): qty 30

## 2014-08-14 NOTE — Progress Notes (Signed)
Patient ID: Stephanie Doyle, female   DOB: 24-Apr-1951, 63 y.o.   MRN: [id number] Subjective:  The patient is alert and pleasant. She nods appropriately. She is in no apparent distress.  Objective: Vital signs in last 24 hours: Temp:  [99.1 F (37.3 C)-100 F (37.8 C)] 100 F (37.8 C) (11/21 0800) Pulse Rate:  [88-119] 107 (11/21 0800) Resp:  [0-33] 16 (11/21 0800) BP: (95-143)/(45-90) 117/58 mmHg (11/21 0800) SpO2:  [95 %-100 %] 99 % (11/21 0800) FiO2 (%):  [40 %] 40 % (11/21 0700) Weight:  [68.3 kg (150 lb 9.2 oz)] 68.3 kg (150 lb 9.2 oz) (11/21 0600)  Intake/Output from previous day: 11/20 0701 - 11/21 0700 In: 3385 [I.V.:475; NG/GT:2260; IV Piggyback:650] Out: 4875 [Urine:4350; Stool:525] Intake/Output this shift: Total I/O In: -  Out: 100 [Urine:100]  Physical exam the patient is Glasgow Coma Scale 11 intubated. She follows commands on the right. She is densely left hemiparetic. Her wound is healing well.  Lab Results:  Recent Labs  08/13/14 0440 08/14/14 0615  WBC 11.9* 8.8  HGB 8.1* 7.5*  HCT 26.2* 24.2*  PLT 319 292   BMET  Recent Labs  08/13/14 1700 08/14/14 0615  NA 151* 153*  K 3.7 3.5*  CL 115* 114*  CO2 26 26  GLUCOSE 134* 140*  BUN 20 19  CREATININE 0.89 0.83  CALCIUM 8.9 8.5    Studies/Results: No results found.  Assessment/Plan: Subarachnoid hemorrhage, anterior communicating aneurysm, status post craniotomy: The patient is logically stable  LOS: 19 days     Thomasena Vandenheuvel D 08/14/2014, 8:12 AM

## 2014-08-14 NOTE — Progress Notes (Signed)
PULMONARY / CRITICAL CARE MEDICINE   Name: Stephanie SilvasVicky D Deupree MRN: [id number] DOB: 08/16/1951    ADMISSION DATE:  07/26/2014 CONSULTATION DATE:  07/26/2014  REFERRING MD :  Dr. Franky Machoabbell  CHIEF COMPLAINT:  Headache  INITIAL PRESENTATION: 63 year old female with no known medical history [facility] with severe headache, found to have SAH. AMS in ED requiring intubation. [facility] PCCM consulted for vent and ICU management.  STUDIES:  11/2 CT head > diffuse SAH, frontal and temporal bilaterally.  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 Status post anterior communicating artery aneurysm clipping; frontal lobe edema stable, small volume intraventricular hemorrhage stable, regression of SAH, no new intracranial abnormality  SIGNIFICANT EVENTS: 11/2[facility]- SAH, Intubated for airway protection, [facility] 11[facility]4- Right pterional craniotomy for clipping of anterior communicating artery aneurysm, complex 11/5 - no cuff leak >> started steroids 11/7 not weaning , lowest ps was 12 11/12 trach > 11/14 hgb 6.6, no active sign of bleeding 11/15 worsening renal failure, vasopressin and neo added for BP goals 11/17 Hb 6.7 - 1 U. Afebrile. Excellent UO. More responsive 08/11/14: ID recommending TTE and PICC line changed to left  side. Continues to be on trach and vent. Anasarca +. Low grade fever 1F + 08/12/14 _ bilateral thigh rash/redness with eedema - ID stopped vancomycin and monitoring. Low grade fever +. Edema improved with lasix. Na holding at 156 with free water correction. Fails wean   SUBJECTIVE/OVERNIGHT/INTERVAL HX More awake and interactive, actually smiling  this AM.  VITAL SIGNS: Temp:  [99.3 F (37.4 C)-100 F (37.8 C)] 99.9 F (37.7 C) (11/21 1100) Pulse Rate:  [88-119] 103 (11/21 1201) Resp:  [0-33] 25 (11/21 1201) BP: (95-143)/(47-90) 102/57 mmHg (11/21 1201) SpO2:  [95 %-100 %] 97 % (11/21 1201) FiO2 (%):  [35 %-40 %] 35 % (11/21 1201) Weight:  [68.3 kg (150 lb 9.2 oz)] 68.3 kg (150 lb 9.2 oz) (11/21 0600)   HEMODYNAMICS:    VENTILATOR SETTINGS: Vent Mode:  [-] PRVC FiO2 (%):  [35 %-40 %] 35 % Set Rate:  [30 bmp] 30 bmp Vt Set:  [370 mL] 370 mL PEEP:  [5 cmH20] 5 cmH20 Plateau Pressure:  [20 cmH20-24 cmH20] 20 cmH20  INTAKE / OUTPUT:  Intake/Output Summary (Last 24 hours) at 08/14/14 1219 Last data filed at 08/14/14 1100  Gross per 24 hour  Intake   2945 ml  Output   2645 ml  Net    300 ml    PHYSICAL EXAMINATION:  Gen: on vent, awake and interactive HEENT: scalp incision c/d/i, trach in place PULM: decreased BS, crackles bilaterally CV: Tachy regular, no mgr Ab: BS+, soft Ext: massive edema/anasarca Derm: no breakdown but has redness bilateral anterior thigh Neuro:  RASS 0 and smiled. Seems better  LABS: PULMONARY  Recent Labs Lab 08/07/14 1700 08/07/14 1928 08/07/14 2238 08/08/14 0226 08/08/14 1534  PHART 7.228* 7.174* 7.162* 7.367 7.222*  PCO2ART 48.5* 55.8* 56.8* 27.1* 40.8  PO2ART 122.0* 102.0* 91.0 28.0* 128.0*  HCO3 19.5* 20.5 20.4 19.6* 16.2*  TCO2 21.0 22 22 21  17.4  O2SAT 98.4 96.0 94.0 92.0 98.3    CBC  Recent Labs Lab 08/12/14 0600 08/13/14  0440 08/14/14 0615  HGB 7.9* 8.1* 7.5*  HCT 25.1* 26.2* 24.2*  WBC 10.9* 11.9* 8.8  PLT 329 319 292    COAGULATION No results for input(s): INR in the last 168 hours.  CARDIAC    Recent Labs Lab 08/08/14 0938  TROPONINI <0.30    Recent Labs Lab 08/12/14 0500  PROBNP 1422.0*     CHEMISTRY  Recent Labs Lab 08/11/14 0600  08/12/14 0600 08/12/14 1200 08/13/14 0440 08/13/14 1133 08/13/14 1700 08/14/14 0615  NA 157*   < > 156* 155*  --  153* 151* 153*  K 3.6*  < > 3.8 3.8  --  3.3* 3.7 3.5*  CL 127*  < > 122* 120*  --  115* 115* 114*  CO2 20  < > 22 23  --  27 26 26   GLUCOSE 90  < > 156* 151*  --  130* 134* 140*  BUN 13  < > 18 19  --  19 20 19   CREATININE 0.89  < > 0.90 0.85  --  0.84 0.89 0.83  CALCIUM 9.2  < > 9.0 8.8  --  9.1 8.9 8.5  MG 2.2  --  2.0  --  1.9  --   --  2.2  PHOS 2.4  --  3.2  --  3.6  --   --  3.4  < > = values in this interval not displayed. Estimated Creatinine Clearance: 58.2 mL/min (by C-G formula based on Cr of 0.83).   LIVER  Recent Labs Lab 08/14/14 0615  AST 23  ALT 20     INFECTIOUS No results for input(s): LATICACIDVEN, PROCALCITON in the last 168 hours.   ENDOCRINE CBG (last 3)   Recent Labs  08/14/14 0419 08/14/14 0814 08/14/14 1156  GLUCAP 148* 130* 135*         IMAGING x48h No results found.     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 Fentanyl gtt for analgesia/sedation-transition to intermittent soon Nimodipine x 6 wks; to end 08/16/14 per neurosurgery Started statin 11/4 ; to end 08/16/14 per neurosurgery  PULMONARY OETT 11/2 >>>trach   A: Acute hypoxemic respiratory failure > HCAP complicated by pulmonary edema  COPD, not in exacerbation. Concern for upper airway edema s/p tracheostomy 11/12   -still  unable to tolerate SBT due to anxiety, deconditioning and wasting  P:   Will attempt TC for the first time.  Will need closer observation during TC trial. Titrate O2 for sat of 88-92%.  CARDIOVASCULAR Rt IJ CVL 11/03 >> A:  Septic shock - off pressors HHH therapy in setting of SAH -- off 3% saline as of 08/11/14   Hypernatremi. Has anasarca + . On combo free water and lasix.   New finding of moderate mitral regurg on echo 08/01/14    - 08/12/14 after 1 dose lasix 08/11/14. BNP 1400s. Dx is VOlume overload and not cHF - 08/13/14 - diurseing  Well. Na some better  P:  Continue free water Lasix 20mg  - change to daily  RENAL  A:   hypernatremia - na high AKI > resolved  P:   Start and continue diuresis since 08/11/14 Continue free water Replete electrolytes as indicated BMET in AM.  GASTROINTESTINAL  A:   Nutrition Ileus P:   Tube feeds  Increase to 30/h - reglan x 6 doses SUP: pepcid  HEMATOLOGIC A:   Anemia of critical illness >  Transfused 1 U PRBC 11/14 and 07/30/14 Leukocytosis. P: F/u CBC: PRBC for hgb <7gm%Only SCD for DVT prevention  INFECTIOUS  Blood 11/02 >>NTD Blood 11/03 >>NTD Blood 11/04 >>NTD C diff 11/06 >> neg Sputum 11/2>>neg UC 11-2>>neg ==== Blood 11/15 >>yeast >> Urine 11/15 >> yeast  Resp 11/15 >> .............. Blood 08/10/14 >>  A:   Sepsis 11/04 > no clear source, treated with broad spectrum Abx for possible PNA Septic shock 11/15 due to fungemia  P:   Pulled  Central line (8 days old) 08/09/14 Changed R Picc to left Picc 08/11/14 Anbitiocs Vanc 07/29/14 >. 08/11/14 (rash) Continue Imipenem 11/15 >  Continue  diflucan 08/09/14  - low threshold to broaden to micafungin if clinical deterioration Await  TTE - and depending on course TEE  ENDOCRINE A:   No acute issues P:   SSI while on tube feeds  FAMILY  - Updates: No family bedside  TODAY'S SUMMARY: Attempt TC trials today, continue anti-fungal coverage pending speciation of the yeast.  Echo pending.  My CC time 31 minutes.  Alyson Reedy, M.D. [facility] Pulmonary/Critical Care Medicine. Pager: [phone]. After hours pager: [phone].      Dr. Kalman Shan, M.D., [facility].C.P Pulmonary and Critical Care Medicine Staff Physician [facility] Pulmonary and Critical Care Pager: [phone], If no answer or between  15:00h  - 7:00h: call [phone]  08/14/2014 12:19 PM

## 2014-08-15 LAB — CBC WITH DIFFERENTIAL/PLATELET
Basophils Absolute: 0 10*3/uL (ref 0.0–0.1)
Basophils Relative: 0 % (ref 0–1)
EOS ABS: 0.9 10*3/uL — AB (ref 0.0–0.7)
Eosinophils Relative: 8 % — ABNORMAL HIGH (ref 0–5)
HCT: 26.2 % — ABNORMAL LOW (ref 36.0–46.0)
Hemoglobin: 8 g/dL — ABNORMAL LOW (ref 12.0–15.0)
LYMPHS ABS: 1.6 10*3/uL (ref 0.7–4.0)
Lymphocytes Relative: 15 % (ref 12–46)
MCH: 28.1 pg (ref 26.0–34.0)
MCHC: 30.5 g/dL (ref 30.0–36.0)
MCV: 91.9 fL (ref 78.0–100.0)
MONOS PCT: 7 % (ref 3–12)
Monocytes Absolute: 0.8 10*3/uL (ref 0.1–1.0)
Neutro Abs: 7.4 10*3/uL (ref 1.7–7.7)
Neutrophils Relative %: 70 % (ref 43–77)
PLATELETS: 313 10*3/uL (ref 150–400)
RBC: 2.85 MIL/uL — AB (ref 3.87–5.11)
RDW: 16.7 % — ABNORMAL HIGH (ref 11.5–15.5)
WBC: 10.7 10*3/uL — ABNORMAL HIGH (ref 4.0–10.5)

## 2014-08-15 LAB — BASIC METABOLIC PANEL
ANION GAP: 12 (ref 5–15)
Anion gap: 14 (ref 5–15)
BUN: 20 mg/dL (ref 6–23)
BUN: 22 mg/dL (ref 6–23)
CALCIUM: 8.9 mg/dL (ref 8.4–10.5)
CHLORIDE: 111 meq/L (ref 96–112)
CO2: 26 mEq/L (ref 19–32)
CO2: 29 meq/L (ref 19–32)
Calcium: 8.9 mg/dL (ref 8.4–10.5)
Chloride: 112 mEq/L (ref 96–112)
Creatinine, Ser: 0.82 mg/dL (ref 0.50–1.10)
Creatinine, Ser: 0.79 mg/dL (ref 0.50–1.10)
GFR calc Af Amer: 85 mL/min — ABNORMAL LOW (ref >90)
GFR calc non Af Amer: 86 mL/min — ABNORMAL LOW (ref >90)
GFR, EST NON AFRICAN AMERICAN: 73 mL/min — AB (ref >90)
GLUCOSE: 149 mg/dL — AB (ref 70–99)
Glucose, Bld: 118 mg/dL — ABNORMAL HIGH (ref 70–99)
POTASSIUM: 4.2 meq/L (ref 3.7–5.3)
Potassium: 4.5 mEq/L (ref 3.7–5.3)
SODIUM: 152 meq/L — AB (ref 137–147)
Sodium: 152 mEq/L — ABNORMAL HIGH (ref 137–147)

## 2014-08-15 LAB — GLUCOSE, CAPILLARY
GLUCOSE-CAPILLARY: 121 mg/dL — AB (ref 70–99)
GLUCOSE-CAPILLARY: 162 mg/dL — AB (ref 70–99)
Glucose-Capillary: 101 mg/dL — ABNORMAL HIGH (ref 70–99)
Glucose-Capillary: 114 mg/dL — ABNORMAL HIGH (ref 70–99)
Glucose-Capillary: 115 mg/dL — ABNORMAL HIGH (ref 70–99)
Glucose-Capillary: 177 mg/dL — ABNORMAL HIGH (ref 70–99)

## 2014-08-15 LAB — MAGNESIUM: Magnesium: 2.1 mg/dL (ref 1.5–2.5)

## 2014-08-15 LAB — PHOSPHORUS: Phosphorus: 3.8 mg/dL (ref 2.3–4.6)

## 2014-08-15 NOTE — Progress Notes (Signed)
PULMONARY / CRITICAL CARE MEDICINE   Name: Stephanie Doyle MRN: [id number] DOB: 23-Dec-1950    ADMISSION DATE:  07/26/2014 CONSULTATION DATE:  07/26/2014  REFERRING MD :  Dr. Franky Macho  CHIEF COMPLAINT:  Headache  INITIAL PRESENTATION: 63 year old female with no known medical history presented to [facility] 11/2 with severe headache, found to have SAH. AMS in ED requiring intubation. She was transferred to [facility] for further eval. PCCM consulted for vent and ICU management.  STUDIES:  11/2 CT head > diffuse SAH, frontal and temporal bilaterally.  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 Status post anterior communicating artery aneurysm clipping; frontal lobe edema stable, small volume intraventricular hemorrhage stable, regression of SAH, no new intracranial abnormality  SIGNIFICANT EVENTS: 11/2[facility], Intubated for airway protection, transferred to [facility] 11/4- Right pterional craniotomy for clipping of anterior communicating artery aneurysm, complex 11/5 - no cuff leak >> started steroids 11/7 not weaning , lowest ps was 12 11/12 trach > 11/14 hgb 6.6, no active sign of bleeding 11/15 worsening renal failure, vasopressin and neo added for BP goals 11/17 Hb 6.7 - 1 U. Afebrile. Excellent UO. More responsive 08/11/14: ID recommending TTE and PICC line changed to left  side. Continues to be on trach and vent. Anasarca +. Low grade fever 38F + 08/12/14 _ bilateral thigh rash/redness with eedema - ID stopped vancomycin and monitoring. Low grade fever +. Edema improved with lasix. Na holding at 156 with free water correction. Fails wean   SUBJECTIVE/OVERNIGHT/INTERVAL HX More awake and interactive, actually smiling  this AM.  VITAL SIGNS: Temp:  [99 F (37.2 C)-100 F (37.8 C)] 99.3 F (37.4 C) (11/22 1100) Pulse Rate:  [85-119] 87 (11/22 1100) Resp:  [0-30] 0 (11/22 1100) BP: (71-142)/(36-82) 87/47 mmHg (11/22 1100) SpO2:  [88 %-100 %] 100 % (11/22 1100) FiO2 (%):  [35 %-40 %] 40 % (11/22 1003) Weight:  [64.4 kg (141 lb 15.6 oz)] 64.4 kg (141 lb 15.6 oz) (11/22 0609)   HEMODYNAMICS:    VENTILATOR SETTINGS: Vent Mode:  [-] PRVC FiO2 (%):  [35 %-40 %] 40 % Set Rate:  [16 bmp] 16 bmp Vt Set:  [370 mL] 370 mL PEEP:  [5 cmH20] 5 cmH20 Plateau Pressure:  [18 cmH20-20 cmH20] 18 cmH20  INTAKE / OUTPUT:  Intake/Output Summary (Last 24 hours) at 08/15/14 1133 Last data filed at 08/15/14 1100  Gross per 24 hour  Intake   2460 ml  Output   2660 ml  Net   -200 ml    PHYSICAL EXAMINATION:  Gen: on vent, awake and interactive HEENT: scalp incision c/d/i, trach in place PULM: decreased BS, crackles bilaterally CV: Tachy regular, no mgr Ab: BS+, soft Ext: massive edema/anasarca Derm: no breakdown but has redness bilateral anterior thigh Neuro:  RASS 0 and smiled. Seems better  LABS: PULMONARY  Recent Labs Lab 08/08/14 1534  PHART 7.222*  PCO2ART 40.8  PO2ART 128.0*  HCO3 16.2*  TCO2 17.4  O2SAT 98.3    CBC  Recent Labs Lab 08/13/14 0440 08/14/14 0615 08/15/14 0445  HGB 8.1* 7.5* 8.0*  HCT 26.2* 24.2* 26.2*  WBC 11.9* 8.8 10.7*  PLT 319 292 313    COAGULATION No results for input(s): INR in  the last 168 hours.  CARDIAC   No results for input(s): TROPONINI in the last 168 hours.  Recent Labs Lab 08/12/14 0500  PROBNP 1422.0*     CHEMISTRY  Recent Labs Lab 08/11/14 0600  08/12/14 0600  08/13/14 0440 08/13/14 1133 08/13/14 1700 08/14/14 0615 08/14/14 1700 08/15/14 0445  NA 157*  < > 156*  < >  --  153* 151* 153* 151* 152*  K 3.6*  < > 3.8  < >  --  3.3* 3.7 3.5* 4.3 4.5  CL 127*  < > 122*  < >  --  115* 115* 114* 112 112  CO2 20  < > 22  < >  --   27 26 26 26 26   GLUCOSE 90  < > 156*  < >  --  130* 134* 140* 130* 149*  BUN 13  < > 18  < >  --  19 20 19 20 20   CREATININE 0.89  < > 0.90  < >  --  0.84 0.89 0.83 0.86 0.82  CALCIUM 9.2  < > 9.0  < >  --  9.1 8.9 8.5 8.7 8.9  MG 2.2  --  2.0  --  1.9  --   --  2.2  --  2.1  PHOS 2.4  --  3.2  --  3.6  --   --  3.4  --  3.8  < > = values in this interval not displayed. Estimated Creatinine Clearance: 57.3 mL/min (by C-G formula based on Cr of 0.82).   LIVER  Recent Labs Lab 08/14/14 0615  AST 23  ALT 20     INFECTIOUS No results for input(s): LATICACIDVEN, PROCALCITON in the last 168 hours.   ENDOCRINE CBG (last 3)   Recent Labs  08/15/14 0005 08/15/14 0417 08/15/14 0804  GLUCAP 115* 121* 114*         IMAGING x48h No results found.     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 Fentanyl gtt for analgesia/sedation-transition to intermittent soon Nimodipine x 6 wks; to end 08/16/14 per neurosurgery Started statin 11/4 ; to end 08/16/14 per neurosurgery  PULMONARY OETT 11/2 >>>trach   A: Acute hypoxemic respiratory failure > HCAP complicated by pulmonary edema  COPD, not in exacerbation. Concern for upper airway edema s/p tracheostomy 11/12   -still  unable to tolerate SBT due to anxiety, deconditioning and wasting  P:   Attempt TC trials today with full vent support. Titrate O2 for sat of 88-92%.  CARDIOVASCULAR Rt IJ CVL 11/03 >> A:  Septic shock - off pressors HHH therapy in setting of SAH -- off 3% saline as of 08/11/14   Hypernatremi. Has anasarca + . On combo free water and lasix.  New finding of moderate mitral regurg on echo 08/01/14    - 08/12/14 after 1 dose lasix 08/11/14. BNP 1400s. Dx is  VOlume overload and not cHF - 08/13/14 - diurseing  Well. Na some better  P:  Continue free water Lasix 20mg  - change to daily  RENAL  A:   hypernatremia - na high AKI > resolved  P:   Start and continue diuresis since 08/11/14 Continue free water Replete electrolytes as indicated BMET in AM.  GASTROINTESTINAL A:   Nutrition Ileus P:   Tube feeds  Increase to 30/h - reglan x 6 doses SUP: pepcid  HEMATOLOGIC A:   Anemia of critical illness >  Transfused 1 U PRBC 11/14 and 07/30/14 Leukocytosis. P: F/u CBC: PRBC for hgb <7gm%Only SCD for DVT prevention  INFECTIOUS  Blood 11/02 >>NTD Blood 11/03 >>NTD Blood 11/04 >>NTD C diff 11/06 >> neg Sputum 11/2>>neg UC 11-2>>neg ==== Blood 11/15 >>yeast >> Urine 11/15 >> yeast  Resp 11/15 >> .............. Blood 08/10/14 >>  A:   Sepsis 11/04 > no clear source, treated with broad spectrum Abx for possible PNA Septic shock 11/15 due to fungemia  P:   Pulled  Central line (6712 days old) 08/09/14 Changed R Picc to left Picc 08/11/14 Anbitiocs Vanc 07/29/14 >. 08/11/14 (rash) Continue Imipenem 11/15 >  Continue  diflucan 08/09/14  - low threshold to broaden to micafungin if clinical deterioration Await  TTE - and depending on course TEE  ENDOCRINE A:   No acute issues P:   SSI while on tube feeds  FAMILY  - Updates: No family bedside  TODAY'S SUMMARY: Attempt TC trials today, continue anti-fungal coverage pending speciation of the yeast.  Echo pending.  My CC time 31 minutes.  Alyson ReedyWesam G. Keaton Stirewalt, M.D. [facility] Pulmonary/Critical Care Medicine. Pager: [phone]. After hours pager: [phone].  08/15/2014 11:33 AM

## 2014-08-15 NOTE — Progress Notes (Signed)
Patient ID: Stephanie SilvasVicky D Doyle, female   DOB: [birth year], 63 y.o.   MRN: [id number] Subjective:  The patient is alert and pleasant. She nods appropriately.  Objective: Vital signs in last 24 hours: Temp:  [99.1 F (37.3 C)-100 F (37.8 C)] 99.1 F (37.3 C) (11/22 0700) Pulse Rate:  [85-119] 98 (11/22 0700) Resp:  [0-30] 24 (11/22 0700) BP: (71-142)/(36-82) 106/69 mmHg (11/22 0700) SpO2:  [95 %-100 %] 100 % (11/22 0700) FiO2 (%):  [35 %-40 %] 40 % (11/22 0700) Weight:  [64.4 kg (141 lb 15.6 oz)] 64.4 kg (141 lb 15.6 oz) (11/22 0609)  Intake/Output from previous day: 11/21 0701 - 11/22 0700 In: 2575 [I.V.:495; NG/GT:1780; IV Piggyback:300] Out: 2820 [Urine:2320; Stool:500] Intake/Output this shift:    Physical exam the patient is alert and appropriate. Glasgow Coma Scale 11 trached. She is moving all 4 extremities to command. Her wound is healing well.  Lab Results:  Recent Labs  08/14/14 0615 08/15/14 0445  WBC 8.8 10.7*  HGB 7.5* 8.0*  HCT 24.2* 26.2*  PLT 292 313   BMET  Recent Labs  08/14/14 1700 08/15/14 0445  NA 151* 152*  K 4.3 4.5  CL 112 112  CO2 26 26  GLUCOSE 130* 149*  BUN 20 20  CREATININE 0.86 0.82  CALCIUM 8.7 8.9    Studies/Results: No results found.  Assessment/Plan: Status post subarachnoid hemorrhage, craniotomy for aneurysm clipping: The patient is neurologically stable.  LOS: 20 days     Stephanie Doyle D 08/15/2014, 8:44 AM

## 2014-08-15 NOTE — Progress Notes (Signed)
Fentanyl 50 ml, in a concentration of 2500 mcg in 250 ml bag, wasted in sink due to being expired. Will continue to monitor.

## 2014-08-15 NOTE — Plan of Care (Signed)
Problem: Phase II Progression Outcomes Goal: Neuro exam at baseline or improved Outcome: Progressing Pt is alert and oriented to person and sometimes place and time.  Pt is communicating her needs and following commands appropriately.

## 2014-08-15 NOTE — Plan of Care (Signed)
Problem: Phase I Progression Outcomes Goal: Pain controlled with appropriate interventions Outcome: Completed/Met Date Met:  08/15/14 Patient is receiving a fentanyl drip and communicates that she has no pain.

## 2014-08-16 DIAGNOSIS — J9601 Acute respiratory failure with hypoxia: Secondary | ICD-10-CM | POA: Diagnosis present

## 2014-08-16 LAB — CBC
HEMATOCRIT: 26.9 % — AB (ref 36.0–46.0)
HEMOGLOBIN: 8 g/dL — AB (ref 12.0–15.0)
MCH: 26.9 pg (ref 26.0–34.0)
MCHC: 29.7 g/dL — ABNORMAL LOW (ref 30.0–36.0)
MCV: 90.6 fL (ref 78.0–100.0)
PLATELETS: 365 10*3/uL (ref 150–400)
RBC: 2.97 MIL/uL — ABNORMAL LOW (ref 3.87–5.11)
RDW: 16.7 % — ABNORMAL HIGH (ref 11.5–15.5)
WBC: 12.3 10*3/uL — AB (ref 4.0–10.5)

## 2014-08-16 LAB — CULTURE, BLOOD (ROUTINE X 2)

## 2014-08-16 LAB — BASIC METABOLIC PANEL
ANION GAP: 9 (ref 5–15)
Anion gap: 12 (ref 5–15)
BUN: 20 mg/dL (ref 6–23)
BUN: 21 mg/dL (ref 6–23)
CO2: 28 mEq/L (ref 19–32)
CO2: 33 mEq/L — ABNORMAL HIGH (ref 19–32)
Calcium: 9 mg/dL (ref 8.4–10.5)
Calcium: 9.2 mg/dL (ref 8.4–10.5)
Chloride: 109 mEq/L (ref 96–112)
Chloride: 107 mEq/L (ref 96–112)
Creatinine, Ser: 0.77 mg/dL (ref 0.50–1.10)
Creatinine, Ser: 0.77 mg/dL (ref 0.50–1.10)
GFR calc Af Amer: >90 mL/min (ref >90)
GFR calc non Af Amer: 86 mL/min — ABNORMAL LOW (ref >90)
GFR, EST NON AFRICAN AMERICAN: 86 mL/min — AB (ref >90)
GLUCOSE: 148 mg/dL — AB (ref 70–99)
Glucose, Bld: 71 mg/dL (ref 70–99)
POTASSIUM: 3.8 meq/L (ref 3.7–5.3)
Potassium: 3.7 mEq/L (ref 3.7–5.3)
SODIUM: 149 meq/L — AB (ref 137–147)
Sodium: 149 mEq/L — ABNORMAL HIGH (ref 137–147)

## 2014-08-16 LAB — GLUCOSE, CAPILLARY
GLUCOSE-CAPILLARY: 135 mg/dL — AB (ref 70–99)
GLUCOSE-CAPILLARY: 158 mg/dL — AB (ref 70–99)
GLUCOSE-CAPILLARY: 88 mg/dL (ref 70–99)
Glucose-Capillary: 112 mg/dL — ABNORMAL HIGH (ref 70–99)
Glucose-Capillary: 141 mg/dL — ABNORMAL HIGH (ref 70–99)
Glucose-Capillary: 185 mg/dL — ABNORMAL HIGH (ref 70–99)

## 2014-08-16 LAB — PHOSPHORUS: Phosphorus: 4 mg/dL (ref 2.3–4.6)

## 2014-08-16 LAB — MAGNESIUM: Magnesium: 2.1 mg/dL (ref 1.5–2.5)

## 2014-08-16 MED ORDER — FENTANYL CITRATE 0.05 MG/ML IJ SOLN
25.0000 ug | INTRAMUSCULAR | Status: DC | PRN
  Administered 2014-08-16 – 2014-08-23 (×8): 25 ug via INTRAVENOUS
  Filled 2014-08-16 (×7): qty 2

## 2014-08-16 MED ORDER — FENTANYL 50 MCG/HR TD PT72
50.0000 ug | MEDICATED_PATCH | TRANSDERMAL | Status: DC
  Administered 2014-08-16 – 2014-08-25 (×5): 50 ug via TRANSDERMAL
  Filled 2014-08-16 (×4): qty 1

## 2014-08-16 NOTE — Plan of Care (Signed)
Problem: Phase II Progression Outcomes Goal: Neuro exam at baseline or improved Outcome: Completed/Met Date Met:  08/16/14 Goal: Extubated and maintains O2 sats > 92% Outcome: Progressing Goal: Hemodynamically stable Outcome: Progressing Goal: Pain controlled Outcome: Progressing     

## 2014-08-16 NOTE — Progress Notes (Addendum)
UR completed.  [facility].Pt's decision-maker, [name], [facility].  Level of care and process of transfer explained to her.  Tour encouraged.  [employer] rep already aware of potential need and [facility] rep looking into benefits.   Carlyle LipaMichelle Bryson, RN BSN MHA CCM Trauma/Neuro ICU Case Manager [phone]

## 2014-08-16 NOTE — Progress Notes (Signed)
ANTIBIOTIC CONSULT NOTE - FOLLOW UP  Pharmacy Consult for Fluconazole Indication: Fungemia  No Known Allergies  Patient Measurements: Height: 5' (152.4 cm) Weight: 141 lb 12.1 oz (64.3 kg) IBW/kg (Calculated) : 45.5 Adjusted Body Weight:   Vital Signs: Temp: 99.7 F (37.6 C) (11/23 1300) BP: 140/62 mmHg (11/23 1300) Pulse Rate: 115 (11/23 1300) Intake/Output from previous day: 11/22 0701 - 11/23 0700 In: 1415 [I.V.:295; NG/GT:920; IV Piggyback:200] Out: 2805 [Urine:2805] Intake/Output from this shift: Total I/O In: 865 [I.V.:25; NG/GT:840] Out: 825 [Urine:825]  Labs:  Recent Labs  08/14/14 0615  08/15/14 0445 08/15/14 1700 08/16/14 0110  WBC 8.8  --  10.7*  --  12.3*  HGB 7.5*  --  8.0*  --  8.0*  PLT 292  --  313  --  365  CREATININE 0.83  < > 0.82 0.79 0.77  < > = values in this interval not displayed. Estimated Creatinine Clearance: 58.7 mL/min (by C-G formula based on Cr of 0.77). No results for input(s): VANCOTROUGH, VANCOPEAK, VANCORANDOM, GENTTROUGH, GENTPEAK, GENTRANDOM, TOBRATROUGH, TOBRAPEAK, TOBRARND, AMIKACINPEAK, AMIKACINTROU, AMIKACIN in the last 72 hours.   Microbiology: Recent Results (from the past 720 hour(s))  MRSA PCR Screening     Status: None   Collection Time: 07/26/14  7:58 PM  Result Value Ref Range Status   MRSA by PCR NEGATIVE NEGATIVE Final    Comment:        The GeneXpert MRSA Assay (FDA approved for NASAL specimens only), is one component of a comprehensive MRSA colonization surveillance program. It is not intended to diagnose MRSA infection nor to guide or monitor treatment for MRSA infections.   Culture, Urine     Status: None   Collection Time: 07/26/14 11:12 PM  Result Value Ref Range Status   Specimen Description URINE, CATHETERIZED  Final   Special Requests NONE  Final   Culture  Setup Time   Final    07/27/2014 10:07 Performed at [facility]    Colony Count NO GROWTH Performed at [facility]    Final   Culture NO GROWTH Performed at [facility]   Final   Report Status 07/28/2014 FINAL  Final  Culture, blood (routine x 2)     Status: None   Collection Time: 07/26/14 11:49 PM  Result Value Ref Range Status   Specimen Description BLOOD LEFT ARM  Final   Special Requests BOTTLES DRAWN AEROBIC ONLY 5CC  Final   Culture  Setup Time   Final    07/27/2014 08:58 Performed at [facility]    Culture   Final    NO GROWTH 5 DAYS Performed at [facility]    Report Status 08/02/2014 FINAL  Final  Culture, respiratory (NON-Expectorated)     Status: None   Collection Time: 07/26/14 11:52 PM  Result Value Ref Range Status   Specimen Description TRACHEAL ASPIRATE  Final   Special Requests NONE  Final   Gram Stain   Final    RARE WBC PRESENT, PREDOMINANTLY PMN RARE SQUAMOUS EPITHELIAL CELLS PRESENT RARE GRAM POSITIVE COCCI IN PAIRS Performed at [facility]    Culture   Final    Non-Pathogenic Oropharyngeal-type Flora Isolated. Performed at [facility]    Report Status 07/29/2014 FINAL  Final  Culture, blood (routine x 2)     Status: None   Collection Time: 07/27/14 12:04 AM  Result Value Ref Range Status   Specimen Description BLOOD LEFT HAND  Final  Special Requests BOTTLES DRAWN AEROBIC ONLY 4CC  Final   Culture  Setup Time   Final    07/27/2014 08:58 Performed at [facility]    Culture   Final    NO GROWTH 5 DAYS Performed at [facility]    Report Status 08/02/2014 FINAL  Final  Culture, respiratory (NON-Expectorated)     Status: None   Collection Time: 07/28/14  8:49 AM  Result Value Ref Range Status   Specimen Description TRACHEAL ASPIRATE  Final   Special Requests Normal  Final   Gram Stain   Final    FEW WBC PRESENT,BOTH PMN AND MONONUCLEAR NO SQUAMOUS EPITHELIAL CELLS SEEN NO ORGANISMS SEEN Performed at [facility]    Culture   Final    Non-Pathogenic Oropharyngeal-type Flora  Isolated. Performed at [facility]    Report Status 07/30/2014 FINAL  Final  Culture, blood (routine x 2)     Status: None   Collection Time: 07/28/14  9:43 AM  Result Value Ref Range Status   Specimen Description BLOOD LEFT HAND  Final   Special Requests BOTTLES DRAWN AEROBIC ONLY 10CC  Final   Culture  Setup Time   Final    07/28/2014 15:42 Performed at [facility]    Culture   Final    NO GROWTH 5 DAYS Performed at [facility]    Report Status 08/03/2014 FINAL  Final  Culture, blood (routine x 2)     Status: None   Collection Time: 07/28/14 10:00 AM  Result Value Ref Range Status   Specimen Description BLOOD LEFT HAND  Final   Special Requests BOTTLES DRAWN AEROBIC ONLY 8CC  Final   Culture  Setup Time   Final    07/28/2014 15:42 Performed at [facility]    Culture   Final    NO GROWTH 5 DAYS Performed at [facility]    Report Status 08/03/2014 FINAL  Final  Clostridium Difficile by PCR     Status: None   Collection Time: 07/30/14  8:06 AM  Result Value Ref Range Status   C difficile by pcr NEGATIVE NEGATIVE Final  Culture, blood (routine x 2)     Status: None (Preliminary result)   Collection Time: 08/08/14  9:40 AM  Result Value Ref Range Status   Specimen Description BLOOD LEFT ARM  Final   Special Requests BOTTLES DRAWN AEROBIC ONLY 2CC  Final   Culture  Setup Time   Final    08/08/2014 15:37 Performed at [facility]    Culture   Final    YEAST Note: Gram Stain Report Called to,Read Back By and Verified With: BETHANY@10 :08AM ON 08/13/14 BY DANTS Performed at [facility]    Report Status PENDING  Incomplete  Culture, blood (routine x 2)     Status: None   Collection Time: 08/08/14 10:20 AM  Result Value Ref Range Status   Specimen Description BLOOD LEFT HAND  Final   Special Requests BOTTLES DRAWN AEROBIC AND ANAEROBIC 5CC  Final   Culture  Setup Time   Final    08/08/2014 19:28 Performed at  [facility]    Culture   Final    CANDIDA TROPICALIS Note: Culture results may be compromised due to an excessive volume of blood received in culture bottles. Gram Stain Report Called to,Read Back By and Verified With: MARK FIBENGE ON 08/09/2014 AT 9:48P BY Serafina Mitchell Performed at [facility]  Report Status 08/16/2014 FINAL  Final  Culture, Urine     Status: None   Collection Time: 08/08/14 10:28 AM  Result Value Ref Range Status   Specimen Description URINE, CATHETERIZED  Final   Special Requests NONE  Final   Culture  Setup Time   Final    08/08/2014 20:36 Performed at [facility]    Colony Count   Final    >=100,000 COLONIES/ML Performed at [facility]    Culture YEAST Performed at [facility]   Final   Report Status 08/10/2014 FINAL  Final  Culture, respiratory (NON-Expectorated)     Status: None   Collection Time: 08/08/14 11:21 AM  Result Value Ref Range Status   Specimen Description TRACHEAL ASPIRATE  Final   Special Requests Normal  Final   Gram Stain   Final    MODERATE WBC PRESENT,BOTH PMN AND MONONUCLEAR RARE SQUAMOUS EPITHELIAL CELLS PRESENT NO ORGANISMS SEEN Performed at [facility]    Culture   Final    Non-Pathogenic Oropharyngeal-type Flora Isolated. Performed at [facility]    Report Status 08/11/2014 FINAL  Final  Culture, blood (routine x 2)     Status: None (Preliminary result)   Collection Time: 08/10/14  5:00 PM  Result Value Ref Range Status   Specimen Description BLOOD LEFT ARM  Final   Special Requests BOTTLES DRAWN AEROBIC ONLY 10CC  Final   Culture  Setup Time   Final    08/11/2014 00:46 Performed at [facility]    Culture   Final           BLOOD CULTURE RECEIVED NO GROWTH TO DATE CULTURE WILL BE HELD FOR 5 DAYS BEFORE ISSUING A FINAL NEGATIVE REPORT Performed at [facility]    Report Status PENDING  Incomplete  Culture, blood (routine x 2)     Status: None  (Preliminary result)   Collection Time: 08/10/14  5:05 PM  Result Value Ref Range Status   Specimen Description BLOOD LEFT ARM  Final   Special Requests BOTTLES DRAWN AEROBIC ONLY 2CC  Final   Culture  Setup Time   Final    08/11/2014 00:46 Performed at [facility]    Culture   Final           BLOOD CULTURE RECEIVED NO GROWTH TO DATE CULTURE WILL BE HELD FOR 5 DAYS BEFORE ISSUING A FINAL NEGATIVE REPORT Performed at [facility]    Report Status PENDING  Incomplete  Clostridium Difficile by PCR     Status: None   Collection Time: 08/13/14  5:35 PM  Result Value Ref Range Status   C difficile by pcr NEGATIVE NEGATIVE Final    Anti-infectives    Start     Dose/Rate Route Frequency Ordered Stop   08/13/14 1600  imipenem-cilastatin (PRIMAXIN) 250 mg in sodium chloride 0.9 % 100 mL IVPB     250 mg200 mL/hr over 30 Minutes Intravenous Every 6 hours 08/13/14 0941 08/14/14 2140   08/10/14 2200  fluconazole (DIFLUCAN) IVPB 400 mg     400 mg100 mL/hr over 120 Minutes Intravenous Every 24 hours 08/09/14 2158     08/10/14 1000  micafungin (MYCAMINE) 100 mg in sodium chloride 0.9 % 100 mL IVPB  Status:  Discontinued     100 mg100 mL/hr over 1 Hours Intravenous Daily 08/10/14 0833 08/10/14 0833   08/09/14 2200  fluconazole (DIFLUCAN) IVPB 800 mg     800 mg200 mL/hr over  120 Minutes Intravenous  Once 08/09/14 2158 08/10/14 0059   08/09/14 1800  vancomycin (VANCOCIN) IVPB 750 mg/150 ml premix  Status:  Discontinued     750 mg150 mL/hr over 60 Minutes Intravenous Every 12 hours 08/09/14 0913 08/11/14 1510   08/09/14 1000  imipenem-cilastatin (PRIMAXIN) 500 mg in sodium chloride 0.9 % 100 mL IVPB  Status:  Discontinued     500 mg200 mL/hr over 30 Minutes Intravenous Every 8 hours 08/09/14 0911 08/13/14 0941   08/08/14 1030  piperacillin-tazobactam (ZOSYN) IVPB 3.375 g  Status:  Discontinued     3.375 g12.5 mL/hr over 240 Minutes Intravenous Every 8 hours 08/08/14 0933 08/08/14 0935    08/08/14 1030  vancomycin (VANCOCIN) IVPB 1000 mg/200 mL premix     1,000 mg200 mL/hr over 60 Minutes Intravenous Every 24 hours 08/08/14 0933 08/09/14 1052   08/08/14 1030  imipenem-cilastatin (PRIMAXIN) 250 mg in sodium chloride 0.9 % 100 mL IVPB  Status:  Discontinued     250 mg200 mL/hr over 30 Minutes Intravenous Every 6 hours 08/08/14 0938 08/09/14 0910   07/29/14 0930  vancomycin (VANCOCIN) IVPB 1000 mg/200 mL premix  Status:  Discontinued     1,000 mg200 mL/hr over 60 Minutes Intravenous Every 24 hours 07/28/14 0941 08/04/14 1026   07/28/14 0930  piperacillin-tazobactam (ZOSYN) IVPB 3.375 g  Status:  Discontinued     3.375 g12.5 mL/hr over 240 Minutes Intravenous Every 8 hours 07/28/14 0916 08/04/14 1026   07/28/14 0930  vancomycin (VANCOCIN) IVPB 1000 mg/200 mL premix     1,000 mg200 mL/hr over 60 Minutes Intravenous  Once 07/28/14 0916 07/28/14 1100   07/27/14 1704  bacitracin 50,000 Units in sodium chloride irrigation 0.9 % 500 mL irrigation  Status:  Discontinued       As needed 07/27/14 1704 07/27/14 2111   07/26/14 2300  ampicillin-sulbactam (UNASYN) 1.5 g in sodium chloride 0.9 % 50 mL IVPB  Status:  Discontinued     1.5 g100 mL/hr over 30 Minutes Intravenous Every 6 hours 07/26/14 2213 07/28/14 0422      Assessment: 63 YOF with SAH  AC: None PTA. SCDs  ID: Flucon D#8 for fungemia for minimum of 2 weeks (loaded appropriately), Vanc stopped 11/18 d/t drug rash, S/p 7d Imi for HCAP. ID on board - thinks fungemia d/t line infection. CVL pulled 11/16, PICC pulled 11/18 and new PICC on opposite arm; Unable to do line holiday; TTE done 11/18 - inadequate to r/o IE, f/u TEE. Tmax 99.9. WBC 12.3 up. Low threshold to broaden to micafungin if clinical deterioration  Unasyn 11/2>>11/4 Vanc 11/4>>11/11; 11/15>>11/18 (rash, redness, edema) Zosyn 11/4>>11/11 Imipenem 11/15>> (11/21) Fluconazole 11/16 >>  11/2 + 11/3 BCx >> ngtd 11/6 CDiff neg 11/4 BCx >> ngtd 11/15 BCx >>  Candida tropicalis x 1 11/5 UCx >> yeast 11/15 RCx (TA) >> NOF 11/17 BCx x2>> pending 11/20 CDiff neg  CV: No hx. EF 60-65%, VSS on lasix, simvastatin  Endo: No hx. CBGs 101-185 on SSI  Gl / Nut: LFTs wnl (11/21) TF held 11/14 d/t oral secretions, resumed 11/16 at a low dose. 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 GCS 10, RASS 1 (goal 0). On Fent at 50 mcg/hr, nimodipine D#21/21 (started 11/3, stop date in place for 11/23), keppra PT, Fent patch  Nephro: SCr 0.77, CrCl~50-60 ml/min, UOP/24h: 1.8. Na down 149. Diurese  Pulm: Hx  COPD -Trached 11/12.  Heme / Onc: Hgb 8 << 7.5; got 1 unit PRBC on 11/14 and again 11/17  PTA Med Issues: spiriva, symbicort  BP: SCDs, MC, H2A  Plan - Cont Fluconazole 400 mg/24h for now    [employer] CorporationCrystal S. Merilynn Finlandobertson, PharmD, BCPS Clinical Staff Pharmacist Pager [phone]  Misty Stanleyobertson, Donney Caraveo Stillinger 08/16/2014,1:45 PM

## 2014-08-16 NOTE — Progress Notes (Signed)
Pt seen and examined. No issues overnight.   EXAM: Temp:  [99.1 F (37.3 C)-99.9 F (37.7 C)] 99.7 F (37.6 C) (11/23 1400) Pulse Rate:  [98-119] 117 (11/23 1400) Resp:  [13-31] 26 (11/23 1400) BP: (100-145)/(45-94) 145/60 mmHg (11/23 1400) SpO2:  [99 %-100 %] 100 % (11/23 1400) FiO2 (%):  [40 %] 40 % (11/23 1300) Weight:  [64.3 kg (141 lb 12.1 oz)] 64.3 kg (141 lb 12.1 oz) (11/23 0450) Intake/Output      11/22 0701 - 11/23 0700 11/23 0701 - 11/24 0700   I.V. (mL/kg) 295 (4.6) 25 (0.4)   NG/GT 920 880   IV Piggyback 200    Total Intake(mL/kg) 1415 (22) 905 (14.1)   Urine (mL/kg/hr) 2805 (1.8) 1025 (2.2)   Stool     Total Output 2805 1025   Net -1390 -120         Awake, alert, mouthing words Nods to questions appropriately Follows command briskly RUE/RLE Antigravity with LUE Occasionally wiggles toes LLE Wound c/d/i  LABS: Lab Results  Component Value Date   CREATININE 0.77 08/16/2014   BUN 20 08/16/2014   NA 149* 08/16/2014   K 3.8 08/16/2014   CL 109 08/16/2014   CO2 28 08/16/2014   Lab Results  Component Value Date   WBC 12.3* 08/16/2014   HGB 8.0* 08/16/2014   HCT 26.9* 08/16/2014   MCV 90.6 08/16/2014   PLT 365 08/16/2014    IMPRESSION: - 63 y.o. female s/p SAH and Acom clipping - Neurologically doing well with slowly improving left hemiparesis - Hypernatremia improving - Fungemia  PLAN: - Can d/c Nimotop/Zocor after today - Stable from neurosurgical standpoint for transfer to stepdown - Cont free water for hypernatremia - Abx for fungemia

## 2014-08-16 NOTE — Progress Notes (Addendum)
Patient remains afebrile. No new infections found at this time. Currently on day 8 of fluconazole for candida tropicalis fungemia noted on peripheral cx on 11/15. Recommend to treat with a total of 14 days using 11/18 as day 1 ( day that all lines removed/replaced). Thus fluconazole can be discontinued on December 1st.  Can change IV fluconazole to fluconazole 400mg  by PEG since it has 100% bioavailability  Will sign off.  Duke Salviaynthia B. Drue SecondSnider MD MPH [facility] [phone]

## 2014-08-16 NOTE — Progress Notes (Signed)
Wasted 240 mL of Fentanyl 250mL bag 5810mcg/mL concentration. Witnessed by Lennie MuckleBethany Culbertson, RN.

## 2014-08-16 NOTE — Progress Notes (Signed)
PULMONARY / CRITICAL CARE MEDICINE   Name: ARIATNA JESTER MRN: [id number] DOB: May 08, 1951    ADMISSION DATE:  07/26/2014 CONSULTATION DATE:  07/26/2014  REFERRING MD :  Dr. Franky Macho  CHIEF COMPLAINT:  Headache  INITIAL PRESENTATION: 63 year old female with no known medical history presented to [facility] 11/2 with severe headache, found to have SAH. AMS in ED requiring intubation. She was transferred to [facility] for further eval. PCCM consulted for vent and ICU management.  STUDIES:  11/2 CT head > diffuse SAH, frontal and temporal bilaterally.  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 Status post anterior communicating artery aneurysm clipping; frontal lobe edema stable, small volume intraventricular hemorrhage stable, regression of SAH, no new intracranial abnormality  SIGNIFICANT EVENTS: 11/2[facility], Intubated for airway protection, transferred to [facility] NICU 11/4- Right pterional craniotomy for clipping of anterior communicating artery aneurysm, complex 11/5 - no cuff leak >> started steroids 11/7 not weaning , lowest ps was 12 11/12 trach > 11/14 hgb 6.6, no active sign of bleeding 11/15 worsening renal failure, vasopressin and neo added for BP goals 11/17 Hb 6.7 - 1 U. Afebrile. Excellent UO. More responsive 08/11/14: ID recommending TTE and PICC line changed to left  side. Continues to be on trach and vent. Anasarca +. Low grade fever 74F + 08/12/14 _ bilateral thigh rash/redness with eedema - ID stopped vancomycin and monitoring. Low grade fever +. Edema improved with lasix. Na holding at 156 with free water correction. Fails wean   SUBJECTIVE/OVERNIGHT/INTERVAL HX Neg 1.3 liters  VITAL SIGNS: Temp:  [99.1  F (37.3 C)-99.9 F (37.7 C)] 99.5 F (37.5 C) (11/23 0900) Pulse Rate:  [87-119] 113 (11/23 0911) Resp:  [0-40] 29 (11/23 0911) BP: (87-142)/(45-94) 121/94 mmHg (11/23 0911) SpO2:  [98 %-100 %] 100 % (11/23 0911) FiO2 (%):  [40 %] 40 % (11/23 0911) Weight:  [64.3 kg (141 lb 12.1 oz)] 64.3 kg (141 lb 12.1 oz) (11/23 0450)   HEMODYNAMICS:    VENTILATOR SETTINGS: Vent Mode:  [-] PRVC FiO2 (%):  [40 %] 40 % Set Rate:  [16 bmp] 16 bmp Vt Set:  [370 mL] 370 mL PEEP:  [5 cmH20] 5 cmH20 Plateau Pressure:  [18 cmH20-30 cmH20] 30 cmH20  INTAKE / OUTPUT:  Intake/Output Summary (Last 24 hours) at 08/16/14 1002 Last data filed at 08/16/14 0900  Gross per 24 hour  Intake   1620 ml  Output   2945 ml  Net  -1325 ml    PHYSICAL EXAMINATION:  Gen: on vent, awake and interactive HEENT: scalp incision c/d/i, trach in place, clean PULM: decreased BS and coarse CV: s1 s2 Tachy regula Ab: BS+, soft Ext: massive edema/anasarca improved Derm: no breakdown but has redness bilateral anterior thigh Neuro:  RASS 0, follows commands intermittent  LABS: PULMONARY No results for input(s): PHART, PCO2ART, PO2ART, HCO3, TCO2, O2SAT in the last 168 hours.  Invalid input(s): PCO2, PO2  CBC  Recent Labs Lab 08/14/14 0615 08/15/14 0445 08/16/14 0110  HGB 7.5* 8.0* 8.0*  HCT 24.2* 26.2* 26.9*  WBC 8.8 10.7* 12.3*  PLT 292 313 365    COAGULATION No results for input(s): INR in the last 168 hours.  CARDIAC   No results for input(s): TROPONINI  in the last 168 hours.  Recent Labs Lab 08/12/14 0500  PROBNP 1422.0*     CHEMISTRY  Recent Labs Lab 08/12/14 0600  08/13/14 0440  08/14/14 0615 08/14/14 1700 08/15/14 0445 08/15/14 1700 08/16/14 0110  NA 156*  < >  --   < > 153* 151* 152* 152* 149*  K 3.8  < >  --   < > 3.5* 4.3 4.5 4.2 3.8  CL 122*  < >  --   < > 114* 112 112 111 109  CO2 22  < >  --   < > 26 26 26 29 28   GLUCOSE 156*  < >  --   < > 140* 130* 149* 118* 148*   BUN 18  < >  --   < > 19 20 20 22 20   CREATININE 0.90  < >  --   < > 0.83 0.86 0.82 0.79 0.77  CALCIUM 9.0  < >  --   < > 8.5 8.7 8.9 8.9 9.0  MG 2.0  --  1.9  --  2.2  --  2.1  --  2.1  PHOS 3.2  --  3.6  --  3.4  --  3.8  --  4.0  < > = values in this interval not displayed. Estimated Creatinine Clearance: 58.7 mL/min (by C-G formula based on Cr of 0.77).   LIVER  Recent Labs Lab 08/14/14 0615  AST 23  ALT 20     INFECTIOUS No results for input(s): LATICACIDVEN, PROCALCITON in the last 168 hours.   ENDOCRINE CBG (last 3)   Recent Labs  08/15/14 2356 08/16/14 0356 08/16/14 0802  GLUCAP 112* 185* 135*    IMAGING x48h No results found.   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 Fentanyl gtt now off, likely require patch Nimodipine x 6 wks; to end 08/16/14 per neurosurgery, allow ot dc Started statin 11/4 ; to end 08/16/14 per neurosurgery, consider continuation  PULMONARY OETT 11/2 >>>trach   A: Acute hypoxemic respiratory failure > HCAP complicated by pulmonary edema  COPD, not in exacerbation. Concern for upper airway edema s/p tracheostomy 11/12 P:   PS 10-12 aqttempts, if fails would like sprints trach collar tid Titrate O2 for sat of 88-92%. faild weaning, repeat pcxr in am   CARDIOVASCULAR Rt IJ CVL 11/03 >> A:  Septic shock - off pressors HHH therapy in setting of SAH -- off 3% saline as of 08/11/14   Hypernatremi. Has anasarca + . On combo free water and lasix.  New finding of moderate mitral regurg on echo 08/01/14  P:  Continue free water,  Na in am ,consider dc in am  Lasix 20mg  - change to daily, was neg 1.3 liters, edema remains, pcxr in am   RENAL  A:   hypernatremia - na high AKI > resolved  P:   Start and continue  diuresis since 08/11/14 Continue free water Lasix remain BMET in AM.  GASTROINTESTINAL A:   Nutrition Ileus P:   Tube feeds  Increase to 30/h - reglan x 6 doses SUP: pepcid  HEMATOLOGIC A:   Anemia of critical illness >  Transfused 1 U PRBC 11/14 and 07/30/14 Leukocytosis. P: Limit phlebotomy SCD for DVT prevention  INFECTIOUS  Blood 11/02 >>  NTD Blood 11/03 >>NTD Blood 11/04 >>NTD C diff 11/06 >> neg Sputum 11/2>>neg UC 11-2>>neg ==== Blood 11/15 >>yeast >> Urine 11/15 >> yeast  Resp 11/15 >> .............. Blood 08/10/14 >>  A:   Sepsis 11/04 > no clear source, treated with broad spectrum Abx for possible PNA Septic shock 11/15 due to fungemia  P:   Pulled  Central line (3112 days old) 08/09/14 Changed R Picc to left Picc 08/11/14 Anbitiocs Vanc 07/29/14 >. 08/11/14 (rash) Continue Imipenem 11/15 >11.23  Continue  diflucan 08/09/14  - low threshold to broaden to micafungin if clinical deterioration Await  TTE - and depending on course TEE  Dc Imipenem Any decliens change diflucan to mycofungin  ENDOCRINE A:   No acute issues P:   SSI while on tube feeds  FAMILY  - Updates: 11/23 fam updated  TODAY'S SUMMARY: wean attempt PS 10-12, if fail to TC sprints, dc imipenem, lasix, na in am , allow nimodipine to dc  My CC time 30 minutes.  Mcarthur Rossettianiel J. Tyson AliasFeinstein, MD, FACP Pgr: [phone] [facility]

## 2014-08-17 ENCOUNTER — Inpatient Hospital Stay (HOSPITAL_COMMUNITY): Payer: 59

## 2014-08-17 LAB — BASIC METABOLIC PANEL
Anion gap: 11 (ref 5–15)
BUN: 20 mg/dL (ref 6–23)
CO2: 31 meq/L (ref 19–32)
Calcium: 8.8 mg/dL (ref 8.4–10.5)
Chloride: 108 mEq/L (ref 96–112)
Creatinine, Ser: 0.75 mg/dL (ref 0.50–1.10)
GFR calc Af Amer: >90 mL/min (ref >90)
GFR calc non Af Amer: 87 mL/min — ABNORMAL LOW (ref >90)
GLUCOSE: 146 mg/dL — AB (ref 70–99)
POTASSIUM: 3.9 meq/L (ref 3.7–5.3)
SODIUM: 150 meq/L — AB (ref 137–147)

## 2014-08-17 LAB — CULTURE, BLOOD (ROUTINE X 2)
CULTURE: NO GROWTH
Culture: NO GROWTH

## 2014-08-17 LAB — GLUCOSE, CAPILLARY
GLUCOSE-CAPILLARY: 125 mg/dL — AB (ref 70–99)
GLUCOSE-CAPILLARY: 130 mg/dL — AB (ref 70–99)
Glucose-Capillary: 95 mg/dL (ref 70–99)
Glucose-Capillary: 139 mg/dL — ABNORMAL HIGH (ref 70–99)
Glucose-Capillary: 142 mg/dL — ABNORMAL HIGH (ref 70–99)
Glucose-Capillary: 142 mg/dL — ABNORMAL HIGH (ref 70–99)
Glucose-Capillary: 129 mg/dL — ABNORMAL HIGH (ref 70–99)
Glucose-Capillary: 118 mg/dL — ABNORMAL HIGH (ref 70–99)

## 2014-08-17 MED ORDER — FLUCONAZOLE 40 MG/ML PO SUSR
400.0000 mg | Freq: Every day | ORAL | Status: DC
  Administered 2014-08-17 – 2014-08-25 (×9): 400 mg
  Filled 2014-08-17 (×10): qty 10

## 2014-08-17 NOTE — Progress Notes (Signed)
PULMONARY / CRITICAL CARE MEDICINE   Name: DELMY HOLDREN MRN: [id number] DOB: 1951-04-14    ADMISSION DATE:  07/26/2014 CONSULTATION DATE:  07/26/2014  REFERRING MD :  Dr. Franky Macho  CHIEF COMPLAINT:  Headache  INITIAL PRESENTATION: 63 year old female with no known medical history presented to [facility] 11/2 with severe headache, found to have SAH. AMS in ED requiring intubation. She was transferred to [facility] for further eval. PCCM consulted for vent and ICU management.  STUDIES:  11/2 CT head > diffuse SAH, frontal and temporal bilaterally.  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 Status post anterior communicating artery aneurysm clipping; frontal lobe edema stable, small volume intraventricular hemorrhage stable, regression of SAH, no new intracranial abnormality  SIGNIFICANT EVENTS: 11/2[facility], Intubated for airway protection, transferred to [facility] NICU 11/4- Right pterional craniotomy for clipping of anterior communicating artery aneurysm, complex 11/5 - no cuff leak >> started steroids 11/7 not weaning , lowest ps was 12 11/12 trach > 11/14 hgb 6.6, no active sign of bleeding 11/15 worsening renal failure, vasopressin and neo added for BP goals 11/17 Hb 6.7 - 1 U. Afebrile. Excellent UO. More responsive 08/11/14: ID recommending TTE and PICC line changed to left  side. Continues to be on trach and vent. Anasarca +. Low grade fever 15F + 08/12/14 _ bilateral thigh rash/redness with eedema - ID stopped vancomycin and monitoring. Low grade fever +. Edema improved with lasix. Na holding at 156 with free water correction. Fails wean   SUBJECTIVE/OVERNIGHT/INTERVAL HX Unable to tolerate TC trials today.  VITAL  SIGNS: Temp:  [98.3 F (36.8 C)-99.9 F (37.7 C)] 99.2 F (37.3 C) (11/24 0730) Pulse Rate:  [101-117] 111 (11/24 0730) Resp:  [16-32] 32 (11/24 0730) BP: (100-158)/(45-62) 142/59 mmHg (11/24 0730) SpO2:  [99 %-100 %] 100 % (11/24 0730) FiO2 (%):  [40 %] 40 % (11/24 0730) Weight:  [65 kg (143 lb 4.8 oz)-65.318 kg (144 lb)] 65 kg (143 lb 4.8 oz) (11/24 0500)   HEMODYNAMICS:    VENTILATOR SETTINGS: Vent Mode:  [-] PRVC FiO2 (%):  [40 %] 40 % Set Rate:  [16 bmp] 16 bmp Vt Set:  [370 mL] 370 mL PEEP:  [5 cmH20] 5 cmH20 Plateau Pressure:  [16 cmH20-26 cmH20] 26 cmH20  INTAKE / OUTPUT:  Intake/Output Summary (Last 24 hours) at 08/17/14 1106 Last data filed at 08/17/14 0800  Gross per 24 hour  Intake   1250 ml  Output   1500 ml  Net   -250 ml    PHYSICAL EXAMINATION:  Gen: on vent, awake and interactive HEENT: scalp incision c/d/i, trach in place, clean PULM: decreased BS and coarse CV: s1 s2 Tachy regula Ab: BS+, soft Ext: massive edema/anasarca improved Derm: no breakdown but has redness bilateral anterior thigh Neuro:  RASS 0, follows commands intermittent  LABS: PULMONARY No results for input(s): PHART, PCO2ART, PO2ART, HCO3, TCO2, O2SAT in the last 168 hours.  Invalid input(s): PCO2, PO2  CBC  Recent Labs Lab 08/14/14 0615 08/15/14 0445 08/16/14 0110  HGB 7.5* 8.0* 8.0*  HCT 24.2* 26.2* 26.9*  WBC 8.8 10.7* 12.3*  PLT 292 313 365    COAGULATION No results for input(s): INR in the last 168 hours.  CARDIAC  No results for input(s): TROPONINI in the last 168 hours.  Recent Labs Lab 08/12/14 0500  PROBNP 1422.0*     CHEMISTRY  Recent Labs Lab 08/12/14 0600  08/13/14 0440  08/14/14 0615  08/15/14 0445 08/15/14 1700 08/16/14 0110 08/16/14 1930 08/17/14 0500  NA 156*  < >  --   < > 153*  < > 152* 152* 149* 149* 150*  K 3.8  < >  --   < > 3.5*  < > 4.5 4.2 3.8 3.7 3.9  CL 122*  < >  --   < > 114*  < > 112 111 109 107 108  CO2 22  < >   --   < > 26  < > 26 29 28  33* 31  GLUCOSE 156*  < >  --   < > 140*  < > 149* 118* 148* 71 146*  BUN 18  < >  --   < > 19  < > 20 22 20 21 20   CREATININE 0.90  < >  --   < > 0.83  < > 0.82 0.79 0.77 0.77 0.75  CALCIUM 9.0  < >  --   < > 8.5  < > 8.9 8.9 9.0 9.2 8.8  MG 2.0  --  1.9  --  2.2  --  2.1  --  2.1  --   --   PHOS 3.2  --  3.6  --  3.4  --  3.8  --  4.0  --   --   < > = values in this interval not displayed. Estimated Creatinine Clearance: 63.5 mL/min (by C-G formula based on Cr of 0.75).   LIVER  Recent Labs Lab 08/14/14 0615  AST 23  ALT 20     INFECTIOUS No results for input(s): LATICACIDVEN, PROCALCITON in the last 168 hours.   ENDOCRINE CBG (last 3)   Recent Labs  08/17/14 0045 08/17/14 0559 08/17/14 0754  GLUCAP 125* 139* 142*    IMAGING x48h Dg Chest Port 1 View  08/17/2014   CLINICAL DATA:  Acute respiratory acidosis.  EXAM: PORTABLE CHEST - 1 VIEW  COMPARISON:  08/09/2014.  FINDINGS: Tracheostomy tube in stable position. Interim removal of right PICC line. Interim placement of left PICC line, its tip is in the lower portion of the superior vena cava. Heart size and pulmonary vascularity normal. Bibasilar subsegmental atelectasis present. Interim partial clearing of right lower lobe infiltrate. No pleural effusion or pneumothorax. No acute osseous abnormality.  IMPRESSION: 1. Tracheostomy tube in stable position. Interim removal of right PICC line. Interval placement of left PICC line. Its tip is in the lower portion of the superior vena cava. 2. Bibasilar subsegmental atelectasis with interim partial clearing of right lower lobe infiltrate.   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 Fentanyl gtt now off, PRN pushes at this time. Nimodipine x 6 wks; to end 08/16/14 per neurosurgery, discontinued Started statin 11/4 ; to end 08/16/14 per neurosurgery, consider continuation  PULMONARY OETT 11/2 >>>trach   A: Acute hypoxemic respiratory failure > HCAP complicated by pulmonary edema  COPD, not in exacerbation. Concern for upper airway edema s/p  tracheostomy 11/12 P:   Will attempt high PS trials today since failed TC already. Titrate O2 for sat of 88-92%. Faild weaning, repeat pcxr in am   CARDIOVASCULAR Rt IJ CVL 11/03 >> A:  Septic shock - off pressors HHH therapy in setting of SAH -- off 3% saline as of 08/11/14   Hypernatremi. Has anasarca + . On combo free water and lasix.  New finding of moderate mitral regurg on echo 08/01/14  P:  Continue free water since Na is rising. Lasix 20mg  IV daily.  RENAL  A:   hypernatremia - na high AKI > resolved  P:   Continue diuresis since 08/11/14 Continue free water Lasix 20 mg IV daily Replace electrolytes as indicated BMET in AM.  GASTROINTESTINAL A:   Nutrition Ileus P:   Tube feeds  Increase to 30/h - reglan x 6 doses complete SUP: pepcid  HEMATOLOGIC A:   Anemia of critical illness >  Transfused 1 U PRBC 11/14 and 07/30/14 Leukocytosis. P: Limit phlebotomy SCD for DVT prevention  INFECTIOUS  Blood 11/02 >>NTD Blood 11/03 >>NTD Blood 11/04 >>NTD C diff 11/06 >> neg Sputum 11/2>>neg UC 11-2>>neg ==== Blood 11/15 >>yeast >> Urine 11/15 >> yeast  Resp 11/15 >> .............. Blood 08/10/14 >>  A:   Sepsis 11/04 > no clear source, treated with broad spectrum Abx for possible PNA Septic shock 11/15 due to fungemia, candida tropicalis  P:   Pulled central line (1312 days old) 08/09/14 Changed R Picc to left Picc 08/11/14 Anbitiocs Vanc 07/29/14 >. 08/11/14 (rash) Continue Imipenem 11/15 >11.23  Continue diflucan 08/09/14 - candida tropicalis Await  TTE - and depending  on course TEE  Dced Imipenem Any decliens change diflucan to mycofungin, anticipate will need 21 days of fluconazole.  ENDOCRINE A:   No acute issues P:   SSI while on tube feeds  FAMILY  - Updates: 11/23 fam updated  TODAY'S SUMMARY: Attempt PS trials today, continue diflucan for 21 days.  Hold in SDU.  Will likely need a vent SNF.  My CC time 30 minutes.  Alyson ReedyWesam G. Yacoub, M.D. [facility] Pulmonary/Critical Care Medicine. Pager: [phone]. After hours pager: [phone].

## 2014-08-18 LAB — GLUCOSE, CAPILLARY
GLUCOSE-CAPILLARY: 102 mg/dL — AB (ref 70–99)
GLUCOSE-CAPILLARY: 123 mg/dL — AB (ref 70–99)
GLUCOSE-CAPILLARY: 160 mg/dL — AB (ref 70–99)
Glucose-Capillary: 139 mg/dL — ABNORMAL HIGH (ref 70–99)
Glucose-Capillary: 171 mg/dL — ABNORMAL HIGH (ref 70–99)
Glucose-Capillary: 128 mg/dL — ABNORMAL HIGH (ref 70–99)

## 2014-08-18 LAB — BASIC METABOLIC PANEL
Anion gap: 10 (ref 5–15)
BUN: 23 mg/dL (ref 6–23)
CALCIUM: 9.4 mg/dL (ref 8.4–10.5)
CO2: 37 mEq/L — ABNORMAL HIGH (ref 19–32)
Chloride: 101 mEq/L (ref 96–112)
Creatinine, Ser: 0.73 mg/dL (ref 0.50–1.10)
GFR calc non Af Amer: 88 mL/min — ABNORMAL LOW (ref >90)
Glucose, Bld: 116 mg/dL — ABNORMAL HIGH (ref 70–99)
POTASSIUM: 4.1 meq/L (ref 3.7–5.3)
SODIUM: 148 meq/L — AB (ref 137–147)

## 2014-08-18 NOTE — Plan of Care (Signed)
Problem: Phase I Progression Outcomes Goal: Respiratory status stable Outcome: Progressing  Problem: Phase II Progression Outcomes Goal: Pain controlled Outcome: Completed/Met Date Met:  08/18/14  Problem: Consults Goal: Skin Care Protocol Initiated - if Braden Score 18 or less If consults are not indicated, leave blank or document N/A  Outcome: Completed/Met Date Met:  08/18/14 Goal: Nutrition Consult-if indicated Outcome: Completed/Met Date Met:  08/18/14 Goal: Diabetes Guidelines if Diabetic/Glucose > 140 If diabetic or lab glucose is > 140 mg/dl - Initiate Diabetes/Hyperglycemia Guidelines & Document Interventions  Outcome: Completed/Met Date Met:  08/18/14  Problem: Phase I Progression Outcomes Goal: Pain controlled with appropriate interventions Outcome: Completed/Met Date Met:  08/18/14 Goal: Patient tolerating nututrition at goal Outcome: Completed/Met Date Met:  08/18/14

## 2014-08-18 NOTE — Progress Notes (Signed)
PULMONARY / CRITICAL CARE MEDICINE   Name: Shelton SilvasVicky D Klassen MRN: [id number] DOB: [birth year]    ADMISSION DATE:  07/26/2014 CONSULTATION DATE:  07/26/2014  REFERRING MD :  Dr. Franky Machoabbell  CHIEF COMPLAINT:  Headache  INITIAL PRESENTATION: 63 year old female with no known medical history [facility] with severe headache, found to have SAH. AMS in ED requiring intubation. [facility]. PCCM consulted for vent and ICU management.  STUDIES:  11/2 CT head > diffuse SAH, frontal and temporal bilaterally.  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 Status post anterior communicating artery aneurysm clipping; frontal lobe edema stable, small volume intraventricular hemorrhage stable, regression of SAH, no new intracranial abnormality  SIGNIFICANT EVENTS: 11/2[facility]- SAH, Intubated for airway protection, [facility] 11/4- Right pterional craniotomy for clipping of anterior communicating artery aneurysm, complex 11/5 - no cuff leak >> started steroids 11/7 not weaning , lowest ps was 12 11/12 trach > 11/14 hgb 6.6, no active sign of bleeding 11/15 worsening renal failure, vasopressin and neo added for BP goals 11/17 Hb 6.7 - 1 U. Afebrile. Excellent UO. More responsive 08/11/14: ID recommending TTE and PICC line changed to left  side. Continues to be on trach and vent. Anasarca +. Low grade fever 50F + 08/12/14 _ bilateral thigh rash/redness with eedema - ID stopped vancomycin and monitoring. Low grade fever +. Edema improved with lasix. Na holding at 156 with free water correction. Fails wean   SUBJECTIVE/OVERNIGHT/INTERVAL HX Unable to tolerate TC trials today.  VITAL  SIGNS: Temp:  [98.9 F (37.2 C)-100.2 F (37.9 C)] 99.4 F (37.4 C) (11/25 0420) Pulse Rate:  [106-121] 115 (11/25 0420) Resp:  [10-32] 14 (11/25 0420) BP: (123-150)/(45-67) 129/54 mmHg (11/25 0420) SpO2:  [99 %-100 %] 99 % (11/25 0420) FiO2 (%):  [40 %] 40 % (11/25 0352) Weight:  [62.2 kg (137 lb 2 oz)] 62.2 kg (137 lb 2 oz) (11/25 0420)   HEMODYNAMICS:    VENTILATOR SETTINGS: Vent Mode:  [-] PRVC FiO2 (%):  [40 %] 40 % Set Rate:  [16 bmp] 16 bmp Vt Set:  [370 mL] 370 mL PEEP:  [5 cmH20] 5 cmH20 Plateau Pressure:  [16 cmH20-26 cmH20] 18 cmH20  INTAKE / OUTPUT:  Intake/Output Summary (Last 24 hours) at 08/18/14 0557 Last data filed at 08/18/14 0019  Gross per 24 hour  Intake   1230 ml  Output   3300 ml  Net  -2070 ml    PHYSICAL EXAMINATION:  Gen: on vent, awake and interactive HEENT: scalp incision c/d/i, trach in place, clean PULM: decreased BS and coarse CV: s1 s2 Tachy regula Ab: BS+, soft Ext: massive edema/anasarca improved Derm: no breakdown but has redness bilateral anterior thigh Neuro:  RASS 0, follows commands intermittent  LABS: PULMONARY No results for input(s): PHART, PCO2ART, PO2ART, HCO3, TCO2, O2SAT in the last 168 hours.  Invalid input(s): PCO2, PO2  CBC  Recent Labs Lab 08/14/14 0615 08/15/14 0445 08/16/14 0110  HGB 7.5* 8.0* 8.0*  HCT 24.2* 26.2* 26.9*  WBC 8.8 10.7* 12.3*  PLT 292 313 365    COAGULATION No results for input(s): INR in the last 168 hours.  CARDIAC   No results  for input(s): TROPONINI in the last 168 hours.  Recent Labs Lab 08/12/14 0500  PROBNP 1422.0*     CHEMISTRY  Recent Labs Lab 08/12/14 0600  08/13/14 0440  08/14/14 0615  08/15/14 0445 08/15/14 1700 08/16/14 0110 08/16/14 1930 08/17/14 0500  NA 156*  < >  --   < > 153*  < > 152* 152* 149* 149* 150*  K 3.8  < >  --   < > 3.5*  < > 4.5 4.2 3.8 3.7 3.9  CL 122*  < >  --   < > 114*  < > 112 111 109 107 108  CO2 22  < >  --   < > 26  < >  26 29 28  33* 31  GLUCOSE 156*  < >  --   < > 140*  < > 149* 118* 148* 71 146*  BUN 18  < >  --   < > 19  < > 20 22 20 21 20   CREATININE 0.90  < >  --   < > 0.83  < > 0.82 0.79 0.77 0.77 0.75  CALCIUM 9.0  < >  --   < > 8.5  < > 8.9 8.9 9.0 9.2 8.8  MG 2.0  --  1.9  --  2.2  --  2.1  --  2.1  --   --   PHOS 3.2  --  3.6  --  3.4  --  3.8  --  4.0  --   --   < > = values in this interval not displayed. Estimated Creatinine Clearance: 58 mL/min (by C-G formula based on Cr of 0.75).   LIVER  Recent Labs Lab 08/14/14 0615  AST 23  ALT 20     INFECTIOUS No results for input(s): LATICACIDVEN, PROCALCITON in the last 168 hours.   ENDOCRINE CBG (last 3)   Recent Labs  08/17/14 2029 08/18/14 0018 08/18/14 0414  GLUCAP 118* 160* 102*    IMAGING x48h Dg Chest Port 1 View  08/17/2014   CLINICAL DATA:  Acute respiratory acidosis.  EXAM: PORTABLE CHEST - 1 VIEW  COMPARISON:  08/09/2014.  FINDINGS: Tracheostomy tube in stable position. Interim removal of right PICC line. Interim placement of left PICC line, its tip is in the lower portion of the superior vena cava. Heart size and pulmonary vascularity normal. Bibasilar subsegmental atelectasis present. Interim partial clearing of right lower lobe infiltrate. No pleural effusion or pneumothorax. No acute osseous abnormality.  IMPRESSION: 1. Tracheostomy tube in stable position. Interim removal of right PICC line. Interval placement of left PICC line. Its tip is in the lower portion of the superior vena cava. 2. Bibasilar subsegmental atelectasis with interim partial clearing of right lower lobe infiltrate.   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 Fentanyl gtt now off, PRN pushes at this time. Nimodipine x 6 wks; to end 08/16/14 per neurosurgery, discontinued Started statin 11/4 ; to end 08/16/14 per neurosurgery, consider continuation  PULMONARY OETT 11/2 >>>trach   A: Acute hypoxemic respiratory failure > HCAP complicated by pulmonary edema  COPD, not in exacerbation. Concern for upper airway edema s/p tracheostomy 11/12  P:   Will reattempt high PS trials today since failed TC already. Titrate O2 for sat of 88-92%. Faild weaning, repeat pcxr in am   CARDIOVASCULAR Rt IJ CVL 11/03 >> A:  Septic shock - off pressors HHH therapy in setting of SAH -- off 3% saline as of 08/11/14   Hypernatremi. Has anasarca + . On combo free water and lasix.  New finding of moderate mitral regurg on echo 08/01/14  P:  Continue free water since Na is rising. Lasix 20mg  IV daily.  RENAL  A:   hypernatremia - na high AKI > resolved  P:   Continue diuresis since 08/11/14 Continue free water Lasix 20 mg IV daily Replace electrolytes as indicated BMET in AM.  GASTROINTESTINAL A:   Nutrition Ileus P:   Tube feeds  Increase to 30/h - reglan x 6 doses complete SUP: pepcid  HEMATOLOGIC A:   Anemia of critical illness >  Transfused 1 U PRBC 11/14 and 07/30/14 Leukocytosis. P: Limit phlebotomy SCD for DVT prevention  INFECTIOUS  Blood 11/02 >>NTD Blood 11/03 >>NTD Blood 11/04 >>NTD C diff 11/06 >> neg Sputum 11/2>>neg UC 11-2>>neg ==== Blood 11/15 >>yeast >> Urine 11/15 >> yeast  Resp 11/15 >> .............. Blood 08/10/14 >>  A:   Sepsis 11/04 > no clear source, treated with broad spectrum Abx for possible PNA Septic shock 11/15 due to fungemia, candida tropicalis  P:   Pulled central line (53 days old) 08/09/14 Changed R Picc to left Picc 08/11/14 Anbitiocs Vanc 07/29/14 >. 08/11/14 (rash) Continue Imipenem 11/15 >11.23  Continue diflucan 08/09/14 - candida tropicalis Await  TTE - and depending on course  TEE  Dced Imipenem Any decliens change diflucan to mycofungin, anticipate will need 21 days of fluconazole.  ENDOCRINE A:   No acute issues P:   SSI while on tube feeds  FAMILY  - Updates: 11/23 fam updated  TODAY'S SUMMARY: Attempt PS trials again, continue diflucan for 21 days.  Hold in SDU.  Will likely need a vent SNF.  Will likely place an LTAC referral towards the end of the week.  Alyson Reedy, M.D. [facility] Pulmonary/Critical Care Medicine. Pager: [phone]. After hours pager: [phone].

## 2014-08-18 NOTE — Progress Notes (Signed)
No issues overnight.  EXAM:  BP 129/66 mmHg  Pulse 115  Temp(Src) 98.3 F (36.8 C) (Oral)  Resp 14  Ht 5\' 3"  (1.6 m)  Wt 62.2 kg (137 lb 2 oz)  BMI 24.30 kg/m2  SpO2 100%  Somnolent, but easily arouses to voice  Nods to questions Follows commands RUE/RLE, LUE, occasionally LLE Good strength on R, >antigravity in LUE  Wound c/d/i  IMPRESSION:  63 y.o. female s/p Acom clipping - Neurologically stable - Fungemia, on diflucan - VDRF - Hypernatremia  PLAN: - Repeat Na, cont free H2O - Cont abx

## 2014-08-18 NOTE — Progress Notes (Signed)
RN called elink   1. Hypernatermia but patient on saline kvo -> dc kvo saline. Start saline lock 2. Patient has superfluous order for fent gtt -> dc fent gtt  Dr. Kalman ShanMurali Hanifah Royse, M.D., [facility] Pulmonary and Critical Care Medicine Staff Physician [facility] Pulmonary and Critical Care Pager: [phone], If no answer or between  15:00h - 7:00h: call [phone]  08/18/2014 9:30 PM

## 2014-08-18 NOTE — Progress Notes (Addendum)
NUTRITION FOLLOW UP  INTERVENTION: Continue current TF regimen RD to follow for nutrition care plan  NUTRITION DIAGNOSIS: Inadequate oral intake related to inability to eat as evidenced by NPO status, ongoing  Goal: Pt to meet >/= 90% of their estimated nutrition needs, met    Monitor:  TF regimen & tolerance, respiratory status, weight, labs, I/O's  ASSESSMENT: 63 year old Female with no known medical history presented to [facility] 11/2 with severe headache, found to have SAH. AMS in ED requiring intubation. She was transferred to [facility] for further evaluation.  Patient s/p procedures 11/12: PERCUTANEOUS TRACHEOSTOMY PEG TUBE PLACEMENT  Transferred from 34mNeuro ICU to 2C-Stepdown 11/23.  Patient is currently on ventilator support  MV: 6.3 L/min Temp (24hrs), Avg:99.2 F (37.3 C), Min:98.4 F (36.9 C), Max:100.2 F (37.9 C)   Vital AF 1.2 formula currently infusing at 40 ml/hr via PEG tube providing 1252 kcals, 87 grams of protein, and 778 ml of free water.  Also receiving liquid MVI daily.  Free water flushes at 300 ml every 4 hours.  Disposition: likely vent SNF.  Height: Ht Readings from Last 1 Encounters:  08/16/14 5' 3"  (1.6 m)    Weight: -----> fluctuating  Wt Readings from Last 1 Encounters:  08/18/14 137 lb 2 oz (62.2 kg)    11/24  143 lb 11/23  141 lb 11/22  141 lb 11/21  150 lb 11/20  148 lb 11/19  150 lb 11/18  162 lb 11/17  165 lb 11/16  164 lb 11/15  173 lb 11/14  165 lb  BMI:  Body mass index is 24.3 kg/(m^2).  Re-estimated Nutritional Needs: Kcal: 1350-1500 Protein: 75-85 gm Fluid: >/= 1.5 L/day  Skin: surgical head incision  Diet Order: NPO   Intake/Output Summary (Last 24 hours) at 08/18/14 0955 Last data filed at 08/18/14 0900  Gross per 24 hour  Intake    550 ml  Output   3600 ml  Net  -3050 ml    Labs:   Recent Labs Lab 08/14/14 0615  08/15/14 0445  08/16/14 0110 08/16/14 1930 08/17/14 0500  NA 153*  < > 152*  < >  149* 149* 150*  K 3.5*  < > 4.5  < > 3.8 3.7 3.9  CL 114*  < > 112  < > 109 107 108  CO2 26  < > 26  < > 28 33* 31  BUN 19  < > 20  < > 20 21 20   CREATININE 0.83  < > 0.82  < > 0.77 0.77 0.75  CALCIUM 8.5  < > 8.9  < > 9.0 9.2 8.8  MG 2.2  --  2.1  --  2.1  --   --   PHOS 3.4  --  3.8  --  4.0  --   --   GLUCOSE 140*  < > 149*  < > 148* 71 146*  < > = values in this interval not displayed.  CBG (last 3)   Recent Labs  08/18/14 0018 08/18/14 0414 08/18/14 0746  GLUCAP 160* 102* 139*    Scheduled Meds: . antiseptic oral rinse  7 mL Mouth Rinse QID  . chlorhexidine  15 mL Mouth Rinse BID  . famotidine  20 mg Per Tube Q12H  . feeding supplement (PRO-STAT SUGAR FREE 64)  30 mL Per Tube Daily  . fentaNYL  50 mcg Transdermal Q72H  . fluconazole  400 mg Per Tube QHS  . free water  300 mL Per  Tube Q4H  . furosemide  20 mg Intravenous Daily  . insulin aspart  0-20 Units Subcutaneous 6 times per day  . ipratropium-albuterol  3 mL Nebulization Q4H  . levETIRAcetam  500 mg Per Tube BID  . multivitamin  5 mL Per Tube Daily  . simvastatin  40 mg Oral q1800  . sodium chloride  10-40 mL Intracatheter Q12H    Continuous Infusions: . sodium chloride 10 mL/hr at 08/18/14 0800  . feeding supplement (VITAL AF 1.2 CAL) 1,000 mL (08/17/14 1106)  . fentaNYL infusion INTRAVENOUS Stopped (08/16/14 0800)    Arthur Holms, RD, LDN Pager #: [phone] After-Hours Pager #: [phone]

## 2014-08-19 ENCOUNTER — Inpatient Hospital Stay (HOSPITAL_COMMUNITY): Payer: 59

## 2014-08-19 DIAGNOSIS — E870 Hyperosmolality and hypernatremia: Secondary | ICD-10-CM

## 2014-08-19 LAB — BLOOD GAS, ARTERIAL
Acid-Base Excess: 14.3 mmol/L — ABNORMAL HIGH (ref 0.0–2.0)
BICARBONATE: 39.2 meq/L — AB (ref 20.0–24.0)
Drawn by: 36989
FIO2: 0.4 %
MECHVT: 370 mL
O2 Saturation: 98.8 %
PEEP/CPAP: 5 cmH2O
PO2 ART: 119 mmHg — AB (ref 80.0–100.0)
Patient temperature: 98.6
RATE: 16 resp/min
TCO2: 40.9 mmol/L (ref 0–100)
pCO2 arterial: 56.8 mmHg — ABNORMAL HIGH (ref 35.0–45.0)
pH, Arterial: 7.453 — ABNORMAL HIGH (ref 7.350–7.450)

## 2014-08-19 LAB — GLUCOSE, CAPILLARY
GLUCOSE-CAPILLARY: 106 mg/dL — AB (ref 70–99)
GLUCOSE-CAPILLARY: 196 mg/dL — AB (ref 70–99)
Glucose-Capillary: 130 mg/dL — ABNORMAL HIGH (ref 70–99)
Glucose-Capillary: 145 mg/dL — ABNORMAL HIGH (ref 70–99)
Glucose-Capillary: 167 mg/dL — ABNORMAL HIGH (ref 70–99)
Glucose-Capillary: 209 mg/dL — ABNORMAL HIGH (ref 70–99)

## 2014-08-19 MED ORDER — METHYLPREDNISOLONE SODIUM SUCC 40 MG IJ SOLR
40.0000 mg | Freq: Two times a day (BID) | INTRAMUSCULAR | Status: DC
  Administered 2014-08-19 – 2014-08-21 (×6): 40 mg via INTRAVENOUS
  Filled 2014-08-19 (×9): qty 1

## 2014-08-19 NOTE — Progress Notes (Signed)
Pt returned to room after stat head CT. Pt is now awake and alert to self. This is still a change to prior assessment but pt seems to be improving.

## 2014-08-19 NOTE — Progress Notes (Signed)
Pt seems stable. Awake alert, trached. Nods that she's doing ok

## 2014-08-19 NOTE — Progress Notes (Signed)
Pt having increased bloody secretions, when coming to the room pts sats in the 50s. Changed inner canula and suctioned pt until sats increased. Sats are now 100 but pts loc has changed to now minimally responsive compared to A+Ox4. Meredith Pelalled Elink MD stated that he would send someone to look at pt.

## 2014-08-19 NOTE — Progress Notes (Signed)
[facility] PCCM PROGRESS NOTE  S: [facility] after hypoxic episode. Patient was on mechanical ventilator via trach as she typically is. Was noted to drop SpO2 into 40's. Bedside RN suctioned and changed inner cannula. She reports suctioning out blood and mucous plugs. After these interventions the patient's SpO2 was regained to 100%, however she became minimally responsive. Her baseline prior to this episode was spontaneously awake, alert, and oriented. Able to follow commands and work TV controls. RN reports that after this episode she is unresponsive. It was then that I arrived at bedside.   O: BP 151/87 mmHg  Pulse 118  Temp(Src) 98.8 F (37.1 C) (Oral)  Resp 24  Ht 5\' 3"  (1.6 m)  Wt 62.2 kg (137 lb 2 oz)  BMI 24.30 kg/m2  SpO2 100%  General:  Female of normal body habitus on MV via trach Neuro:  Lethargic, alert to painful stimuli only. Follows commands on R only. After awakes to pain falls back asleep after about 5 seconds. PERRL HEENT:  [place]/AT, no JVD noted. Trach in place. Cardiovascular:  Tachy, regular, no MRG Lungs: Clear bilateral breath sounds. Synchronous with vent. Respirations even.  Abdomen:  Soft, non-tender, non-distended Musculoskeletal:  No acute deformity Skin:  Intact, +2 pitting edema to BLE   A/P: VDRF, tracheostomy status Hypoxemia Acute change in mental status L sided weakness - STAT ABG - STAT CXR - STAT CT head - Increase FIO2 for now - Suctioning PRN by RT and RN - Pending CXR,  If plugging and hemoptysis continue may require FOB.    Joneen RoachPaul Erique Kaser, ACNP [facility] Pager [phone] or [phone]

## 2014-08-19 NOTE — Progress Notes (Signed)
eLink Physician-Brief Progress Note Patient Name: Stephanie SilvasVicky D Doyle DOB: Jun 14, 1951 MRN: [id number]   Date of Service  08/19/2014  HPI/Events of Note  RN reports episode where O2 saturation dropped requiring suctioning; O2 saturation now 100%, but per RN less responsive than prior to the episode  eICU Interventions  Bedside PCCM NP to evaluate, may need head CT     Intervention Category Intermediate Interventions: Change in mental status - evaluation and management  MCQUAID, DOUGLAS 08/19/2014, 3:51 AM

## 2014-08-19 NOTE — Progress Notes (Signed)
ANTIBIOTIC CONSULT NOTE - FOLLOW UP  Pharmacy Consult for Fluconazole Indication: Fungemia  Allergies  Allergen Reactions  . Vancomycin     Pt had red rash all over body after vanc was given, this was told to me in report but had not been added to the allergy list     Patient Measurements: Height: 5\' 3"  (160 cm) Weight: 128 lb 12 oz (58.4 kg) IBW/kg (Calculated) : 52.4  Vital Signs: Temp: 98.8 F (37.1 C) (11/26 1236) Temp Source: Oral (11/26 1236) BP: 112/46 mmHg (11/26 1236) Pulse Rate: 120 (11/26 1236) Intake/Output from previous day: 11/25 0701 - 11/26 0700 In: 2320 [I.V.:360; NG/GT:1960] Out: 3350 [Urine:3075; Stool:275] Intake/Output from this shift: Total I/O In: -  Out: 1550 [Urine:1550]  Labs:  Recent Labs  08/16/14 1930 08/17/14 0500 08/18/14 1630  CREATININE 0.77 0.75 0.73   Estimated Creatinine Clearance: 58 mL/min (by C-G formula based on Cr of 0.73). No results for input(s): VANCOTROUGH, VANCOPEAK, VANCORANDOM, GENTTROUGH, GENTPEAK, GENTRANDOM, TOBRATROUGH, TOBRAPEAK, TOBRARND, AMIKACINPEAK, AMIKACINTROU, AMIKACIN in the last 72 hours.   Microbiology: Recent Results (from the past 720 hour(s))  MRSA PCR Screening     Status: None   Collection Time: 07/26/14  7:58 PM  Result Value Ref Range Status   MRSA by PCR NEGATIVE NEGATIVE Final    Comment:        The GeneXpert MRSA Assay (FDA approved for NASAL specimens only), is one component of a comprehensive MRSA colonization surveillance program. It is not intended to diagnose MRSA infection nor to guide or monitor treatment for MRSA infections.   Culture, Urine     Status: None   Collection Time: 07/26/14 11:12 PM  Result Value Ref Range Status   Specimen Description URINE, CATHETERIZED  Final   Special Requests NONE  Final   Culture  Setup Time   Final    07/27/2014 10:07 Performed at [facility]    Colony Count NO GROWTH Performed at [facility]   Final   Culture NO  GROWTH Performed at [facility]   Final   Report Status 07/28/2014 FINAL  Final  Culture, blood (routine x 2)     Status: None   Collection Time: 07/26/14 11:49 PM  Result Value Ref Range Status   Specimen Description BLOOD LEFT ARM  Final   Special Requests BOTTLES DRAWN AEROBIC ONLY 5CC  Final   Culture  Setup Time   Final    07/27/2014 08:58 Performed at [facility]    Culture   Final    NO GROWTH 5 DAYS Performed at [facility]    Report Status 08/02/2014 FINAL  Final  Culture, respiratory (NON-Expectorated)     Status: None   Collection Time: 07/26/14 11:52 PM  Result Value Ref Range Status   Specimen Description TRACHEAL ASPIRATE  Final   Special Requests NONE  Final   Gram Stain   Final    RARE WBC PRESENT, PREDOMINANTLY PMN RARE SQUAMOUS EPITHELIAL CELLS PRESENT RARE GRAM POSITIVE COCCI IN PAIRS Performed at [facility]    Culture   Final    Non-Pathogenic Oropharyngeal-type Flora Isolated. Performed at [facility]    Report Status 07/29/2014 FINAL  Final  Culture, blood (routine x 2)     Status: None   Collection Time: 07/27/14 12:04 AM  Result Value Ref Range Status   Specimen Description BLOOD LEFT HAND  Final   Special Requests BOTTLES DRAWN AEROBIC ONLY 4CC  Final  Culture  Setup Time   Final    07/27/2014 08:58 Performed at [facility]    Culture   Final    NO GROWTH 5 DAYS Performed at [facility]    Report Status 08/02/2014 FINAL  Final  Culture, respiratory (NON-Expectorated)     Status: None   Collection Time: 07/28/14  8:49 AM  Result Value Ref Range Status   Specimen Description TRACHEAL ASPIRATE  Final   Special Requests Normal  Final   Gram Stain   Final    FEW WBC PRESENT,BOTH PMN AND MONONUCLEAR NO SQUAMOUS EPITHELIAL CELLS SEEN NO ORGANISMS SEEN Performed at [facility]    Culture   Final    Non-Pathogenic Oropharyngeal-type Flora Isolated. Performed at  [facility]    Report Status 07/30/2014 FINAL  Final  Culture, blood (routine x 2)     Status: None   Collection Time: 07/28/14  9:43 AM  Result Value Ref Range Status   Specimen Description BLOOD LEFT HAND  Final   Special Requests BOTTLES DRAWN AEROBIC ONLY 10CC  Final   Culture  Setup Time   Final    07/28/2014 15:42 Performed at [facility]    Culture   Final    NO GROWTH 5 DAYS Performed at [facility]    Report Status 08/03/2014 FINAL  Final  Culture, blood (routine x 2)     Status: None   Collection Time: 07/28/14 10:00 AM  Result Value Ref Range Status   Specimen Description BLOOD LEFT HAND  Final   Special Requests BOTTLES DRAWN AEROBIC ONLY 8CC  Final   Culture  Setup Time   Final    07/28/2014 15:42 Performed at [facility]    Culture   Final    NO GROWTH 5 DAYS Performed at [facility]    Report Status 08/03/2014 FINAL  Final  Clostridium Difficile by PCR     Status: None   Collection Time: 07/30/14  8:06 AM  Result Value Ref Range Status   C difficile by pcr NEGATIVE NEGATIVE Final  Culture, blood (routine x 2)     Status: None (Preliminary result)   Collection Time: 08/08/14  9:40 AM  Result Value Ref Range Status   Specimen Description BLOOD LEFT ARM  Final   Special Requests BOTTLES DRAWN AEROBIC ONLY 2CC  Final   Culture  Setup Time   Final    08/08/2014 15:37 Performed at [facility]    Culture   Final    YEAST Note: Gram Stain Report Called to,Read Back By and Verified With: BETHANY@10 :08AM ON 08/13/14 BY DANTS Performed at [facility]    Report Status PENDING  Incomplete  Culture, blood (routine x 2)     Status: None   Collection Time: 08/08/14 10:20 AM  Result Value Ref Range Status   Specimen Description BLOOD LEFT HAND  Final   Special Requests BOTTLES DRAWN AEROBIC AND ANAEROBIC 5CC  Final   Culture  Setup Time   Final    08/08/2014 19:28 Performed at [facility]     Culture   Final    CANDIDA TROPICALIS Note: Culture results may be compromised due to an excessive volume of blood received in culture bottles. Gram Stain Report Called to,Read Back By and Verified With: MARK FIBENGE ON 08/09/2014 AT 9:48P BY Serafina MitchellWILEJ Performed at [facility]    Report Status 08/16/2014 FINAL  Final  Culture, Urine  Status: None   Collection Time: 08/08/14 10:28 AM  Result Value Ref Range Status   Specimen Description URINE, CATHETERIZED  Final   Special Requests NONE  Final   Culture  Setup Time   Final    08/08/2014 20:36 Performed at [facility]    Colony Count   Final    >=100,000 COLONIES/ML Performed at [facility]    Culture YEAST Performed at [facility]   Final   Report Status 08/10/2014 FINAL  Final  Culture, respiratory (NON-Expectorated)     Status: None   Collection Time: 08/08/14 11:21 AM  Result Value Ref Range Status   Specimen Description TRACHEAL ASPIRATE  Final   Special Requests Normal  Final   Gram Stain   Final    MODERATE WBC PRESENT,BOTH PMN AND MONONUCLEAR RARE SQUAMOUS EPITHELIAL CELLS PRESENT NO ORGANISMS SEEN Performed at [facility]    Culture   Final    Non-Pathogenic Oropharyngeal-type Flora Isolated. Performed at [facility]    Report Status 08/11/2014 FINAL  Final  Culture, blood (routine x 2)     Status: None   Collection Time: 08/10/14  5:00 PM  Result Value Ref Range Status   Specimen Description BLOOD LEFT ARM  Final   Special Requests BOTTLES DRAWN AEROBIC ONLY 10CC  Final   Culture  Setup Time   Final    08/11/2014 00:46 Performed at [facility]    Culture   Final    NO GROWTH 5 DAYS Performed at [facility]    Report Status 08/17/2014 FINAL  Final  Culture, blood (routine x 2)     Status: None   Collection Time: 08/10/14  5:05 PM  Result Value Ref Range Status   Specimen Description BLOOD LEFT ARM  Final   Special Requests BOTTLES  DRAWN AEROBIC ONLY 2CC  Final   Culture  Setup Time   Final    08/11/2014 00:46 Performed at [facility]    Culture   Final    NO GROWTH 5 DAYS Performed at [facility]    Report Status 08/17/2014 FINAL  Final  Clostridium Difficile by PCR     Status: None   Collection Time: 08/13/14  5:35 PM  Result Value Ref Range Status   C difficile by pcr NEGATIVE NEGATIVE Final    Anti-infectives    Start     Dose/Rate Route Frequency Ordered Stop   08/17/14 2200  fluconazole (DIFLUCAN) 40 MG/ML suspension 400 mg     400 mg Per Tube Daily at bedtime 08/17/14 0754     08/13/14 1600  imipenem-cilastatin (PRIMAXIN) 250 mg in sodium chloride 0.9 % 100 mL IVPB     250 mg200 mL/hr over 30 Minutes Intravenous Every 6 hours 08/13/14 0941 08/14/14 2140   08/10/14 2200  fluconazole (DIFLUCAN) IVPB 400 mg  Status:  Discontinued     400 mg100 mL/hr over 120 Minutes Intravenous Every 24 hours 08/09/14 2158 08/17/14 0753   08/10/14 1000  micafungin (MYCAMINE) 100 mg in sodium chloride 0.9 % 100 mL IVPB  Status:  Discontinued     100 mg100 mL/hr over 1 Hours Intravenous Daily 08/10/14 0833 08/10/14 0833   08/09/14 2200  fluconazole (DIFLUCAN) IVPB 800 mg     800 mg200 mL/hr over 120 Minutes Intravenous  Once 08/09/14 2158 08/10/14 0059   08/09/14 1800  vancomycin (VANCOCIN) IVPB 750 mg/150 ml premix  Status:  Discontinued     750  mg150 mL/hr over 60 Minutes Intravenous Every 12 hours 08/09/14 0913 08/11/14 1510   08/09/14 1000  imipenem-cilastatin (PRIMAXIN) 500 mg in sodium chloride 0.9 % 100 mL IVPB  Status:  Discontinued     500 mg200 mL/hr over 30 Minutes Intravenous Every 8 hours 08/09/14 0911 08/13/14 0941   08/08/14 1030  piperacillin-tazobactam (ZOSYN) IVPB 3.375 g  Status:  Discontinued     3.375 g12.5 mL/hr over 240 Minutes Intravenous Every 8 hours 08/08/14 0933 08/08/14 0935   08/08/14 1030  vancomycin (VANCOCIN) IVPB 1000 mg/200 mL premix     1,000 mg200 mL/hr over 60 Minutes  Intravenous Every 24 hours 08/08/14 0933 08/09/14 1052   08/08/14 1030  imipenem-cilastatin (PRIMAXIN) 250 mg in sodium chloride 0.9 % 100 mL IVPB  Status:  Discontinued     250 mg200 mL/hr over 30 Minutes Intravenous Every 6 hours 08/08/14 0938 08/09/14 0910   07/29/14 0930  vancomycin (VANCOCIN) IVPB 1000 mg/200 mL premix  Status:  Discontinued     1,000 mg200 mL/hr over 60 Minutes Intravenous Every 24 hours 07/28/14 0941 08/04/14 1026   07/28/14 0930  piperacillin-tazobactam (ZOSYN) IVPB 3.375 g  Status:  Discontinued     3.375 g12.5 mL/hr over 240 Minutes Intravenous Every 8 hours 07/28/14 0916 08/04/14 1026   07/28/14 0930  vancomycin (VANCOCIN) IVPB 1000 mg/200 mL premix     1,000 mg200 mL/hr over 60 Minutes Intravenous  Once 07/28/14 0916 07/28/14 1100   07/27/14 1704  bacitracin 50,000 Units in sodium chloride irrigation 0.9 % 500 mL irrigation  Status:  Discontinued       As needed 07/27/14 1704 07/27/14 2111   07/26/14 2300  ampicillin-sulbactam (UNASYN) 1.5 g in sodium chloride 0.9 % 50 mL IVPB  Status:  Discontinued     1.5 g100 mL/hr over 30 Minutes Intravenous Every 6 hours 07/26/14 2213 07/28/14 0422      Assessment: 63 YOF admitted on 07/26/2014 with SAH On flucon D#8 for fungemia for minimum of 2 weeks (loaded appropriately), Vanc stopped 11/18 d/t drug rash, S/p 7d Imi for HCAP. ID on board - thinks fungemia d/t line infection. CVL pulled 11/16, PICC pulled 11/18 and new PICC on opposite arm; Unable to do line holiday; TTE done 11/18 - inadequate to r/o IE, f/u TEE. LFTs on 11/20 wnl.  Remains afebrile, WBC elevated at 12.3 on 11/22. Low threshold to broaden to micafungin if clinical deterioration  Unasyn 11/2>>11/4 Vanc 11/4>>11/11; 11/15>>11/18 (rash, redness, edema) Zosyn 11/4>>11/11 Imipenem 11/15>> (11/21) Fluconazole 11/16 >>  11/2, 11/3, 11/4 BCx >> NEG 11/6, 11/20 CDiff neg 11/15 BCx >> Candida tropicalis x 1 11/5 UCx >> yeast 11/15 RCx (TA) >> NOF 11/17  BCx x2>> NEG  Plan - Cont Fluconazole 400 mg/24h for now - Monitor temp trend, LFTs periodically and for clinical improvement  Dearl Rudden K. Bonnye Fava, PharmD Clinical Pharmacist - Resident Pager: [phone] Pharmacy: [phone] 08/19/2014 1:36 PM

## 2014-08-19 NOTE — Progress Notes (Signed)
Called to pt bedside by RN due to pt desating. Inner cannula had been replaced upon arrival, and pt was 100%. Pt had thick bloody secretions in old inner cannula. Pt is stable but developed decreased LOC. Consulting with PCCM NP.

## 2014-08-19 NOTE — Progress Notes (Addendum)
PULMONARY / CRITICAL CARE MEDICINE   Name: Stephanie Doyle MRN: [id number] DOB: Aug 13, 1951    ADMISSION DATE:  07/26/2014 CONSULTATION DATE:  07/26/2014  REFERRING MD :  Dr. Franky Machoabbell  CHIEF COMPLAINT:  Headache  INITIAL PRESENTATION: 63 year old female with no known medical history [facility]/2 with severe headache, found to have SAH. AMS in ED requiring intubation. [facility] PCCM consulted for vent and ICU management.  STUDIES:  11/2 CT head > diffuse SAH, frontal and temporal bilaterally.  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 Afebrile. Excellent UO. More responsive 08/11/14: ID recommending TTE and PICC line changed to left  side. Continues to be on trach and vent. Anasarca +. Low grade fever 71F + 08/12/14 _ bilateral thigh rash/redness with eedema - ID stopped vancomycin and monitoring. Low grade fever +. Edema improved with lasix. Na holding at 156 with free water correction. Fails wean   SUBJECTIVE/OVERNIGHT/INTERVAL HX Episode of desat and AMS overnight with  mucus plugging.  Now is better, CT head was neg  VITAL SIGNS: Temp:  [98.3 F (36.8 C)-98.9 F (37.2 C)] 98.5 F (36.9 C) (11/26 0742) Pulse Rate:  [112-119] 112 (11/26 0842) Resp:  [17-28] 17 (11/26 0842) BP: (102-151)/(52-87) 121/67 mmHg (11/26 0842) SpO2:  [100 %] 100 % (11/26 0842) FiO2 (%):  [40 %] 40 % (11/26 0842) Weight:  [58.4 kg (128 lb 12 oz)] 58.4 kg (128 lb 12 oz) (11/26 0500)   HEMODYNAMICS: CV stable    VENTILATOR SETTINGS: Vent Mode:  [-] PSV;CPAP FiO2 (%):  [40 %] 40 % Set Rate:  [16 bmp] 16 bmp Vt Set:  [370 mL] 370 mL PEEP:  [5 cmH20] 5 cmH20 Pressure Support:  [18 cmH20] 18 cmH20 Plateau Pressure:  [16 cmH20-24 cmH20] 24 cmH20  INTAKE / OUTPUT:  Intake/Output Summary (Last 24 hours) at 08/19/14 0906 Last data filed at 08/19/14 0500  Gross per 24 hour  Intake    770 ml  Output   3050 ml  Net  -2280 ml    PHYSICAL EXAMINATION:  Gen: on vent, awake and interactive HEENT: scalp incision c/d/i, trach in place, clean PULM: decreased BS and coarse CV: s1 s2 Tachy regula Ab: BS+, soft Ext: massive edema/anasarca improved Derm: no breakdown but has redness bilateral anterior thigh Neuro:  RASS 0, follows commands , interactive, no apparent sequelae from mucus plugging  LABS: PULMONARY  Recent Labs Lab 08/19/14 0407  PHART 7.453*  PCO2ART 56.8*  PO2ART 119.0*  HCO3 39.2*  TCO2 40.9  O2SAT 98.8    CBC  Recent Labs Lab 08/14/14 0615 08/15/14 0445 08/16/14 0110  HGB 7.5* 8.0* 8.0*  HCT  24.2* 26.2* 26.9*  WBC 8.8 10.7* 12.3*  PLT 292 313 365    COAGULATION No results for input(s): INR in the last 168 hours.  CARDIAC   No results for input(s): TROPONINI in the last 168 hours. No results for input(s): PROBNP in the last 168 hours.   CHEMISTRY  Recent Labs Lab 08/13/14 0440  08/14/14 0615  08/15/14 0445 08/15/14 1700 08/16/14 0110 08/16/14 1930 08/17/14 0500 08/18/14 1630  NA  --   < > 153*  < > 152* 152* 149* 149*  150* 148*  K  --   < > 3.5*  < > 4.5 4.2 3.8 3.7 3.9 4.1  CL  --   < > 114*  < > 112 111 109 107 108 101  CO2  --   < > 26  < > 26 29 28  33* 31 37*  GLUCOSE  --   < > 140*  < > 149* 118* 148* 71 146* 116*  BUN  --   < > 19  < > 20 22 20 21 20 23   CREATININE  --   < > 0.83  < > 0.82 0.79 0.77 0.77 0.75 0.73  CALCIUM  --   < > 8.5  < > 8.9 8.9 9.0 9.2 8.8 9.4  MG 1.9  --  2.2  --  2.1  --  2.1  --   --   --   PHOS 3.6  --  3.4  --  3.8  --  4.0  --   --   --   < > = values in this interval not displayed. Estimated Creatinine Clearance: 58 mL/min (by C-G formula based on Cr of 0.73).   LIVER  Recent Labs Lab 08/14/14 0615  AST 23  ALT 20     INFECTIOUS No results for input(s): LATICACIDVEN, PROCALCITON in the last 168 hours.   ENDOCRINE CBG (last 3)   Recent Labs  08/19/14 0035 08/19/14 0537 08/19/14 0740  GLUCAP 167* 130* 106*    IMAGING x48h Ct Head Wo Contrast  08/19/2014   CLINICAL DATA:  Hypoxic episode.  EXAM: CT HEAD WITHOUT CONTRAST  TECHNIQUE: Contiguous axial images were obtained from the base of the skull through the vertex without intravenous contrast.  COMPARISON:  08/09/2014  FINDINGS: Skull and Sinuses:Stable appearance of right frontal craniotomy  Unchanged opacification of the left middle ear and mastoid cavities.  Orbits: No acute abnormality.  Brain: Continued clearance of subarachnoid hemorrhage. There is decreasing intraventricular hemorrhage with small volume still visible in the left occipital horn. No new hemorrhage is identified. Mild fluid accumulation deep to the bone flap is unchanged. There may be a tiny hygroma around the left frontal convexity as well, without mass effect. Subacute infarcts in the bilateral ACA territory, most confluent in the inferior bifrontal region and corpus callosum genu. These are less visible due to fogging. An infarct involving the right caudate head and anterior internal capsule has also increased in density. There is no  evidence of new infarct or hemorrhagic conversion. Grossly stable orientation of aneurysm clips near the right anterior communicating artery. There is mild ventriculomegaly which is stable from previous.  IMPRESSION: 1. No acute intracranial findings. 2. Clearing intracranial hemorrhage with minimal intraventricular blood persistent. 3. Evolution of multi focal brain infarct, as above. 4. Stable mild ventriculomegaly.   Electronically Signed   By: Tiburcio PeaJonathan  Watts M.D.   On: 08/19/2014 05:29   Dg Chest Port 1 View  08/19/2014  CLINICAL DATA:  Hypoxia  EXAM: PORTABLE CHEST - 1 VIEW  COMPARISON:  08/17/2014  FINDINGS: Tracheostomy tube remains well seated. Left upper extremity PICC, tip at the SVC.  Normal heart size and mediastinal contours for technique.  Persistent hazy and streaky opacification at the bases. No edema, effusion, or visible pneumothorax.  IMPRESSION: 1. No new findings to explain hypoxia. 2. Unchanged bibasilar atelectasis or pneumonia.   Electronically Signed   By: Tiburcio Pea M.D.   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 Fentanyl gtt now off, PRN pushes at this time. Nimodipine now OFF Stopped STATIN  PULMONARY OETT 11/2 >>>trach   A: Acute hypoxemic respiratory failure > HCAP complicated by pulmonary edema  COPD, with mucus plugging, bronchoconstriction Concern for upper airway edema s/p tracheostomy 11/12 P:   Cont full vent for now Titrate O2 for sat of 88-92%. Start steroids IV  Cont duoneb q4H  CARDIOVASCULAR Rt IJ CVL 11/03 >> A:  Septic shock - off pressors HHH therapy in setting of SAH -- off 3% saline as of 08/11/14   Hypernatremi. Has anasarca + . On combo free water and lasix.  New finding of moderate mitral  regurg on echo 08/01/14  P:  Cont  free water Lasix 20mg  IV daily.  RENAL  A:   hypernatremia - na high AKI > resolved  P:   Continue diuresis  Continue free water Lasix 20 mg IV daily Replace electrolytes as indicated BMET in AM.  GASTROINTESTINAL A:   Nutrition Ileus P:   Tube feeds   SUP: pepcid  HEMATOLOGIC A:   Anemia of critical illness >  monitor Leukocytosis. P: Limit phlebotomy SCD for DVT prevention  INFECTIOUS  Blood 11/02 >>NTD Blood 11/03 >>NTD Blood 11/04 >>NTD C diff 11/06 >> neg Sputum 11/2>>neg UC 11-2>>neg ==== Blood 11/15 >>yeast >>candida tropicalis 2/2 pos Urine 11/15 >> yeast  Resp 11/15 >>NEG .............. Blood 08/10/14 >>NG  A:   Sepsis 11/04 > no clear source, treated with broad spectrum Abx for possible PNA Septic shock 11/15 due to fungemia, candida tropicalis poss line related TTE NEG for vegetations, prob need TEE to r/o endocarditis P:   Pulled central line (65 days old) 08/09/14 Changed R Picc to left Picc 08/11/14 Anbitiocs Vanc 07/29/14 >. 08/11/14 (rash) Continue Imipenem 11/15 >11.23  Continue diflucan 08/09/14 - candida tropicalis  Any decline will change diflucan to mycofungin, anticipate will need 21 days of fluconazole. Consider TEE  ENDOCRINE A:   No acute issues P:   SSI while on tube feeds  FAMILY  - Updates: no family in room 11/26  CC time   TODAY'S SUMMARY:mucus plugging overnight, full vent 11/26, continue diflucan for 21 days.  Hold in SDU.  Will likely need a vent SNF.  Needs LTAC care   Dorcas Carrow Beeper  [phone]  Cell  [phone]  If no response or cell goes to voicemail, call beeper [phone] 9:14 AM 08/19/2014

## 2014-08-20 LAB — CULTURE, BLOOD (ROUTINE X 2)

## 2014-08-20 LAB — BASIC METABOLIC PANEL
ANION GAP: 7 (ref 5–15)
BUN: 26 mg/dL — ABNORMAL HIGH (ref 6–23)
CO2: 42 mEq/L (ref 19–32)
Calcium: 9.5 mg/dL (ref 8.4–10.5)
Chloride: 99 mEq/L (ref 96–112)
Creatinine, Ser: 0.67 mg/dL (ref 0.50–1.10)
GFR calc non Af Amer: >90 mL/min (ref >90)
Glucose, Bld: 237 mg/dL — ABNORMAL HIGH (ref 70–99)
POTASSIUM: 4.1 meq/L (ref 3.7–5.3)
SODIUM: 148 meq/L — AB (ref 137–147)

## 2014-08-20 LAB — CBC WITH DIFFERENTIAL/PLATELET
BASOS ABS: 0 10*3/uL (ref 0.0–0.1)
Basophils Relative: 0 % (ref 0–1)
Eosinophils Absolute: 0 10*3/uL (ref 0.0–0.7)
Eosinophils Relative: 0 % (ref 0–5)
HCT: 26.1 % — ABNORMAL LOW (ref 36.0–46.0)
Hemoglobin: 7.7 g/dL — ABNORMAL LOW (ref 12.0–15.0)
LYMPHS ABS: 0.7 10*3/uL (ref 0.7–4.0)
Lymphocytes Relative: 7 % — ABNORMAL LOW (ref 12–46)
MCH: 26.8 pg (ref 26.0–34.0)
MCHC: 29.5 g/dL — ABNORMAL LOW (ref 30.0–36.0)
MCV: 90.9 fL (ref 78.0–100.0)
Monocytes Absolute: 0.4 10*3/uL (ref 0.1–1.0)
Monocytes Relative: 4 % (ref 3–12)
NEUTROS PCT: 89 % — AB (ref 43–77)
Neutro Abs: 9.2 10*3/uL — ABNORMAL HIGH (ref 1.7–7.7)
PLATELETS: 433 10*3/uL — AB (ref 150–400)
RBC: 2.87 MIL/uL — AB (ref 3.87–5.11)
RDW: 15.3 % (ref 11.5–15.5)
WBC: 10.3 10*3/uL (ref 4.0–10.5)

## 2014-08-20 LAB — GLUCOSE, CAPILLARY
GLUCOSE-CAPILLARY: 184 mg/dL — AB (ref 70–99)
GLUCOSE-CAPILLARY: 200 mg/dL — AB (ref 70–99)
Glucose-Capillary: 216 mg/dL — ABNORMAL HIGH (ref 70–99)
Glucose-Capillary: 266 mg/dL — ABNORMAL HIGH (ref 70–99)
Glucose-Capillary: 174 mg/dL — ABNORMAL HIGH (ref 70–99)
Glucose-Capillary: 289 mg/dL — ABNORMAL HIGH (ref 70–99)

## 2014-08-20 MED ORDER — FUROSEMIDE 10 MG/ML PO SOLN
40.0000 mg | Freq: Every day | ORAL | Status: DC
  Administered 2014-08-21 – 2014-08-26 (×7): 40 mg
  Filled 2014-08-20 (×3): qty 4
  Filled 2014-08-20: qty 5
  Filled 2014-08-20 (×4): qty 4

## 2014-08-20 NOTE — Progress Notes (Addendum)
PHARMACY - BRIEF PROGRESS NOTE  Blood culture results noted from 11/15 - 1/2 Candida tropicalis and now 1/2 Candida glabrata.  [facility] who had also questioned the results.  She had run advanced confirmatory testing on the C. glabrata.  A previous tech had run confirmatory testing on the C. tropicalis isolate.   She has repeat blood cultures from 11/17 that are clear. She has been on Fluconazole since 11/16 (Day #12).  She did not receive any echinocandin therapy this admission.  Assessment: Fungemia with discordant culture results.  It appears she has cleared her fungemia on fluconazole therapy which indicates both of her isolates are likely fluconazole sensitive.  Plan: Discussed with Dr. Orvan Falconerampbell.  He will review her case today.  At this time no therapy changes are recommended.  Stephanie Doyle, Pharm.D., BCPS, AAHIVP Clinical Pharmacist Phone: [phone] or [phone] 08/20/2014, 8:54 AM  Addendum: I agree with continuing high-dose fluconazole since she remains afebrile and repeat blood cultures on November 17 were negative. I strongly suspect that both cannulae sweats or fluconazole sensitive. I would continue fluconazole therapy through December 1.  Stephanie AstersJohn Glynda Soliday, MD [facility] [phone] pager   [phone] cell 08/21/2014, 2:51 PM

## 2014-08-20 NOTE — Plan of Care (Signed)
Problem: SLP Cognition Goals Goal: Patient will demonstrate attention to functional Patient will demonstrate attention to functional task with Outcome: Progressing  Problem: SLP Language Goals Goal: Patient will communicate needs/wants with Outcome: Progressing Goal: Patient will follow commands during a functional ADL with Outcome: Progressing

## 2014-08-20 NOTE — Evaluation (Signed)
Speech Language Pathology Evaluation Patient Details Name: Stephanie SilvasVicky D Sybert MRN: [id number] DOB: October 17, 1950 Today's Date: 08/20/2014 Time: [id number] SLP Time Calculation (min) (ACUTE ONLY): 26 min  Problem List:  Patient Active Problem List   Diagnosis Date Noted  . Hypernatremia 08/19/2014  . Acute respiratory failure with hypoxia   . HCAP (healthcare-associated pneumonia)   . Acute on chronic respiratory failure 08/11/2014  . Fungemia 08/11/2014  . Hypomagnesemia 08/01/2014  . Hypophosphatemia 08/01/2014  . Subarachnoid hemorrhage   . Acute respiratory failure   . Absolute anemia   . COPD, moderate 05/18/2014   Past Medical History:  Past Medical History  Diagnosis Date  . COPD (chronic obstructive pulmonary disease)    Past Surgical History:  Past Surgical History  Procedure Laterality Date  . Craniotomy Right 07/27/2014    Procedure: CRANIOTOMY INTRACRANIAL ANEURYSM FOR Pterional Aneurysm;  Surgeon: Lisbeth RenshawNeelesh Nundkumar, MD;  Location: [facility] NEURO ORS;  Service: Neurosurgery;  Laterality: Right;  . Esophagogastroduodenoscopy (egd) with propofol N/A 08/05/2014    Procedure: ESOPHAGOGASTRODUODENOSCOPY (EGD) WITH PROPOFOL;  Surgeon: [name] Thompson, MD;  [facility];  Service: General;  Laterality: N/A;  . Peg placement N/A 08/05/2014    Procedure: PERCUTANEOUS ENDOSCOPIC GASTROSTOMY (PEG) PLACEMENT;  Surgeon: [name] Thompson, MD;  Location: [facility] ENDOSCOPY;  Service: General;  Laterality: N/A;  bedside/tech only   HPI:  Stephanie Doyle is a 63 y.o. female who presented to the emergency department with sudden onset of severe headache, requiring intubation in the emergency department. Workup included CT scan of the brain which demonstrated diffuse subarachnoid hemorrhage, frontal and temporal bilaterally. 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 Pt underwent right craniotomy for clipping of the ACA aneurysm on 11/3. Pt underwent trach  placement after failing to wean from the vent on 08/14/14. She has remained ventilatory dependent due to fungemia and possible airway edema following trach. Pt has been reported to be alert and nodding to questions. SLP to evaluate commincation and cognitive fucntion.    Assessment / Plan / Recommendation Clinical Impression  Pt presents with fleeting focused, to occasionally briefly sustained, attention. SLP had to arouse pt from sleep; once awake total assist needed for pt to respond to any question or follow one step commands. Pt initially was restless, pulling at lines and not attentive to therapist despite max to total assist. Briefly pt became attentive while SLP in pts left visual field, able to follow one step commands x5, respond to Y/N with 50% accuracy, attempted to write her name on a white board with perseverative errors observed. Pt demonstrates deficits in low level attention and signs also concerning for expressive and receptive language impairement. Recommend continued acute therapy to maximize attention and participation with basic functional and communicative tasks.     SLP Assessment  Patient needs continued Speech Lanaguage Pathology Services    Follow Up Recommendations  LTACH;Skilled Nursing facility    Frequency and Duration min 2x/week  2 weeks   Pertinent Vitals/Pain     SLP Goals  Potential to Achieve Goals (ACUTE ONLY): Fair  SLP Evaluation Prior Functioning  Cognitive/Linguistic Baseline: Information not available   Cognition  Overall Cognitive Status: Impaired/Different from baseline Arousal/Alertness: Awake/alert Orientation Level: Oriented to person;Disoriented to place;Disoriented to time;Disoriented to situation Attention: Focused Focused Attention: Impaired Focused Attention Impairment: Functional basic Awareness: Impaired Awareness Impairment: Intellectual impairment Problem Solving: Impaired Problem Solving Impairment: Functional  basic Safety/Judgment: Impaired    Comprehension  Auditory Comprehension Overall Auditory Comprehension:  Impaired Yes/No Questions: Impaired Basic Biographical Questions: 26-50% accurate Commands: Impaired One Step Basic Commands: 25-49% accurate Interfering Components: Attention;Visual impairments Visual Recognition/Discrimination Discrimination: Exceptions to [facility] Common Objects: Unable to indentify    Expression Verbal Expression Overall Verbal Expression: Other (comment) (trached and vented. no movement of articulators. ) Common Objects: Unable to indentify   Oral / Motor Oral Motor/Sensory Function Overall Oral Motor/Sensory Function: Other (comment) (UTA due to mentation)   GO    Harlon DittyBonnie Natasha Burda, MA CCC-SLP [phone]  Claudine MoutonDeBlois, Ladashia Demarinis Caroline 08/20/2014, 2:07 PM

## 2014-08-20 NOTE — Progress Notes (Signed)
Subjective: Patient nods and smiles that she is doing okay  Objective: Vital signs in last 24 hours: Temp:  [98.4 F (36.9 C)-99.6 F (37.6 C)] 98.4 F (36.9 C) (11/27 0700) Pulse Rate:  [96-120] 100 (11/27 0826) Resp:  [10-20] 16 (11/27 0826) BP: (112-150)/(46-68) 145/66 mmHg (11/27 0826) SpO2:  [95 %-100 %] 99 % (11/27 0826) FiO2 (%):  [40 %] 40 % (11/27 0826) Weight:  [126 lb 1.7 oz (57.2 kg)] 126 lb 1.7 oz (57.2 kg) (11/27 0500)  Intake/Output from previous day: 11/26 0730 - 11/27 0729 In: -  Out: 3575 [Urine:3575] Intake/Output this shift: Total I/O In: 1820 [NG/GT:1820] Out: -   No change in neurologic exam, she is awake and alert, she is tremulous.  Lab Results: Lab Results  Component Value Date   WBC 10.3 08/20/2014   HGB 7.7* 08/20/2014   HCT 26.1* 08/20/2014   MCV 90.9 08/20/2014   PLT 433* 08/20/2014   Lab Results  Component Value Date   INR 1.27 08/05/2014   BMET Lab Results  Component Value Date   NA 148* 08/20/2014   K 4.1 08/20/2014   CL 99 08/20/2014   CO2 42* 08/20/2014   GLUCOSE 237* 08/20/2014   BUN 26* 08/20/2014   CREATININE 0.67 08/20/2014   CALCIUM 9.5 08/20/2014    Studies/Results: Ct Head Wo Contrast  08/19/2014   CLINICAL DATA:  Hypoxic episode.  EXAM: CT HEAD WITHOUT CONTRAST  TECHNIQUE: Contiguous axial images were obtained from the base of the skull through the vertex without intravenous contrast.  COMPARISON:  08/09/2014  FINDINGS: Skull and Sinuses:Stable appearance of right frontal craniotomy  Unchanged opacification of the left middle ear and mastoid cavities.  Orbits: No acute abnormality.  Brain: Continued clearance of subarachnoid hemorrhage. There is decreasing intraventricular hemorrhage with small volume still visible in the left occipital horn. No new hemorrhage is identified. Mild fluid accumulation deep to the bone flap is unchanged. There may be a tiny hygroma around the left frontal convexity as well, without mass  effect. Subacute infarcts in the bilateral ACA territory, most confluent in the inferior bifrontal region and corpus callosum genu. These are less visible due to fogging. An infarct involving the right caudate head and anterior internal capsule has also increased in density. There is no evidence of new infarct or hemorrhagic conversion. Grossly stable orientation of aneurysm clips near the right anterior communicating artery. There is mild ventriculomegaly which is stable from previous.  IMPRESSION: 1. No acute intracranial findings. 2. Clearing intracranial hemorrhage with minimal intraventricular blood persistent. 3. Evolution of multi focal brain infarct, as above. 4. Stable mild ventriculomegaly.   Electronically Signed   By: Tiburcio PeaJonathan  Watts M.D.   On: 08/19/2014 05:29   Dg Chest Port 1 View  08/19/2014   CLINICAL DATA:  Hypoxia  EXAM: PORTABLE CHEST - 1 VIEW  COMPARISON:  08/17/2014  FINDINGS: Tracheostomy tube remains well seated. Left upper extremity PICC, tip at the SVC.  Normal heart size and mediastinal contours for technique.  Persistent hazy and streaky opacification at the bases. No edema, effusion, or visible pneumothorax.  IMPRESSION: 1. No new findings to explain hypoxia. 2. Unchanged bibasilar atelectasis or pneumonia.   Electronically Signed   By: Tiburcio PeaJonathan  Watts M.D.   On: 08/19/2014 04:36    Assessment/Plan: Stable neurologic exam. Continue current management. Remains ventilated through tracheostomy.   LOS: 25 days    JONES,DAVID S 08/20/2014, 11:04 AM

## 2014-08-20 NOTE — Progress Notes (Signed)
eLink Physician-Brief Progress Note Patient Name: Stephanie Doyle DOB: 16-Sep-1951 MRN: [id number]   [facility] of [facility]  08/20/2014  HPI/Events of Note  [facility] c. Ellard ArtisGlabrata and tropicalis both isolated from 11/15 blood culture, for some reason c. Ellard ArtisGlabrata reported on 11/27 Pharmacy has discussed with ID, considering clinical improvement with fluconazole will maintain that treatment for now  eICU Interventions  none     Intervention Category Major Interventions: Infection - evaluation and management  MCQUAID, DOUGLAS 08/20/2014, 8:22 PM

## 2014-08-20 NOTE — Progress Notes (Signed)
PULMONARY / CRITICAL CARE MEDICINE   Name: Stephanie Doyle MRN: [id number] DOB: Feb 11, 1951    ADMISSION DATE:  07/26/2014 CONSULTATION DATE:  07/26/2014  REFERRING MD :  Dr. Franky Machoabbell  CHIEF COMPLAINT:  Headache  INITIAL PRESENTATION: 63 year old female with no known medical history [facility] 11/2 with severe headache, found to have SAH. AMS in ED requiring intubation. [facility]. PCCM consulted for vent and ICU management.  STUDIES:  11/2 CT head > diffuse SAH, frontal and temporal bilaterally.  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 Status post anterior communicating artery aneurysm clipping; frontal lobe edema stable, small volume intraventricular hemorrhage stable, regression of SAH, no new intracranial abnormality  SIGNIFICANT EVENTS: 11/2[facility]- SAH, Intubated for airway protection, [facility] 11/4- Right pterional craniotomy for clipping of anterior communicating artery aneurysm, complex 11/5 - no cuff leak >> started steroids 11/7 not weaning , lowest ps was 12 11/12 trach > 11/14 hgb 6.6, no active sign of bleeding 11/15 worsening renal failure, vasopressin and neo added for BP goals 11/17 Hb 6.7 - 1 U. Afebrile. Excellent UO. More responsive 08/11/14: ID recommending TTE and PICC line changed to left  side. Continues to be on trach and vent. Anasarca +. Low grade fever 68F + 08/12/14 _ bilateral thigh rash/redness with eedema - ID stopped vancomycin and monitoring. Low grade fever +. Edema improved with lasix. Na holding at 156 with free water correction. Fails wean   SUBJECTIVE/OVERNIGHT/INTERVAL HX No events since yesterday's desat episode  with AMS.  Improved this AM.  VITAL SIGNS: Temp:  [98.4 F (36.9 C)-99.6 F (37.6 C)] 98.4 F (36.9 C) (11/27 0700) Pulse Rate:  [96-120] 100 (11/27 0826) Resp:  [10-20] 16 (11/27 0826) BP: (112-150)/(46-68) 145/66 mmHg (11/27 0826) SpO2:  [95 %-100 %] 99 % (11/27 0826) FiO2 (%):  [40 %] 40 % (11/27 0826) Weight:  [57.2 kg (126 lb 1.7 oz)] 57.2 kg (126 lb 1.7 oz) (11/27 0500)   HEMODYNAMICS: CV stable    VENTILATOR SETTINGS: Vent Mode:  [-] PSV;CPAP FiO2 (%):  [40 %] 40 % Set Rate:  [16 bmp] 16 bmp Vt Set:  [370 mL] 370 mL PEEP:  [5 cmH20] 5 cmH20 Pressure Support:  [16 cmH20] 16 cmH20 Plateau Pressure:  [14 cmH20-17 cmH20] 15 cmH20  INTAKE / OUTPUT:  Intake/Output Summary (Last 24 hours) at 08/20/14 1059 Last data filed at 08/20/14 0900  Gross per 24 hour  Intake   1820 ml  Output   3575 ml  Net  -1755 ml    PHYSICAL EXAMINATION:  Gen: on vent, awake and interactive HEENT: scalp incision c/d/i, trach in place, clean PULM: decreased BS and coarse CV: s1 s2 Tachy regula Ab: BS+, soft Ext: massive edema/anasarca improved Derm: no breakdown but has redness bilateral anterior thigh Neuro:  RASS 0, follows commands , interactive, no apparent sequelae from mucus plugging  LABS: PULMONARY  Recent Labs Lab 08/19/14 0407  PHART 7.453*  PCO2ART 56.8*  PO2ART 119.0*  HCO3 39.2*  TCO2 40.9  O2SAT 98.8    CBC  Recent Labs Lab 08/15/14 0445 08/16/14 0110 08/20/14 0545  HGB 8.0* 8.0* 7.7*  HCT 26.2* 26.9* 26.1*  WBC  10.7* 12.3* 10.3  PLT 313 365 433*    COAGULATION No results for input(s): INR in the last 168 hours.  CARDIAC   No results for input(s): TROPONINI in the last 168 hours. No results for input(s): PROBNP in the last 168 hours.   CHEMISTRY  Recent Labs Lab 08/14/14 0615  08/15/14 0445  08/16/14 0110 08/16/14 1930 08/17/14 0500 08/18/14 1630 08/20/14 0545  NA 153*  < > 152*  < > 149* 149* 150* 148* 148*  K 3.5*  < > 4.5  < >  3.8 3.7 3.9 4.1 4.1  CL 114*  < > 112  < > 109 107 108 101 99  CO2 26  < > 26  < > 28 33* 31 37* 42*  GLUCOSE 140*  < > 149*  < > 148* 71 146* 116* 237*  BUN 19  < > 20  < > 20 21 20 23  26*  CREATININE 0.83  < > 0.82  < > 0.77 0.77 0.75 0.73 0.67  CALCIUM 8.5  < > 8.9  < > 9.0 9.2 8.8 9.4 9.5  MG 2.2  --  2.1  --  2.1  --   --   --   --   PHOS 3.4  --  3.8  --  4.0  --   --   --   --   < > = values in this interval not displayed. Estimated Creatinine Clearance: 58 mL/min (by C-G formula based on Cr of 0.67).   LIVER  Recent Labs Lab 08/14/14 0615  AST 23  ALT 20     INFECTIOUS No results for input(s): LATICACIDVEN, PROCALCITON in the last 168 hours.   ENDOCRINE CBG (last 3)   Recent Labs  08/20/14 0017 08/20/14 0432 08/20/14 0738  GLUCAP 200* 216* 266*    IMAGING x48h Ct Head Wo Contrast  08/19/2014   CLINICAL DATA:  Hypoxic episode.  EXAM: CT HEAD WITHOUT CONTRAST  TECHNIQUE: Contiguous axial images were obtained from the base of the skull through the vertex without intravenous contrast.  COMPARISON:  08/09/2014  FINDINGS: Skull and Sinuses:Stable appearance of right frontal craniotomy  Unchanged opacification of the left middle ear and mastoid cavities.  Orbits: No acute abnormality.  Brain: Continued clearance of subarachnoid hemorrhage. There is decreasing intraventricular hemorrhage with small volume still visible in the left occipital horn. No new hemorrhage is identified. Mild fluid accumulation deep to the bone flap is unchanged. There may be a tiny hygroma around the left frontal convexity as well, without mass effect. Subacute infarcts in the bilateral ACA territory, most confluent in the inferior bifrontal region and corpus callosum genu. These are less visible due to fogging. An infarct involving the right caudate head and anterior internal capsule has also increased in density. There is no evidence of new infarct or hemorrhagic conversion. Grossly stable  orientation of aneurysm clips near the right anterior communicating artery. There is mild ventriculomegaly which is stable from previous.  IMPRESSION: 1. No acute intracranial findings. 2. Clearing intracranial hemorrhage with minimal intraventricular blood persistent. 3. Evolution of multi focal brain infarct, as above. 4. Stable mild ventriculomegaly.   Electronically Signed   By: Tiburcio PeaJonathan  Watts M.D.   On: 08/19/2014 05:29   Dg Chest Port 1 View  08/19/2014   CLINICAL DATA:  Hypoxia  EXAM: PORTABLE CHEST - 1 VIEW  COMPARISON:  08/17/2014  FINDINGS: Tracheostomy tube remains well seated. Left upper extremity PICC, tip at the SVC.  Normal heart  size and mediastinal contours for technique.  Persistent hazy and streaky opacification at the bases. No edema, effusion, or visible pneumothorax.  IMPRESSION: 1. No new findings to explain hypoxia. 2. Unchanged bibasilar atelectasis or pneumonia.   Electronically Signed   By: Tiburcio Pea M.D.   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 Fentanyl gtt now off, PRN pushes at this time. Nimodipine now OFF Stopped STATIN  PULMONARY OETT 11/2 >>>trach   A: Acute hypoxemic respiratory failure > HCAP complicated by pulmonary edema  COPD, with mucus plugging, bronchoconstriction Concern for upper airway edema s/p tracheostomy 11/12 P:   Begin PS trials. Titrate O2 for sat of 88-92%. Continue steroids IV solumedrol 40 mg IV q12 hours Cont duoneb q4H  CARDIOVASCULAR Rt IJ CVL 11/03 >> A:  Septic shock - off pressors HHH therapy in setting of SAH -- off 3% saline as of 08/11/14   Hypernatremi. Has anasarca + . On combo free water and lasix.  New finding of moderate mitral regurg on echo 08/01/14  P:  Cont  free  water Lasix 20mg  IV daily >>> Change to 40 mg PO daily.  RENAL  A:   hypernatremia - na high AKI > resolved  P:   Lasix change to 40 mg PO daily. Continue free water Replace electrolytes as indicated BMET in AM.  GASTROINTESTINAL A:   Nutrition Ileus P:   Tube feeds   SUP: pepcid  HEMATOLOGIC A:   Anemia of critical illness >  monitor Leukocytosis. P: Limit phlebotomy SCD for DVT prevention  INFECTIOUS  Blood 11/02 >>NTD Blood 11/03 >>NTD Blood 11/04 >>NTD C diff 11/06 >> neg Sputum 11/2>>neg UC 11-2>>neg ==== Blood 11/15 >>yeast >>candida tropicalis 2/2 pos Urine 11/15 >> yeast  Resp 11/15 >>NEG .............. Blood 08/10/14 >>NG  A:   Sepsis 11/04 > no clear source, treated with broad spectrum Abx for possible PNA Septic shock 11/15 due to fungemia, candida tropicalis poss line related TTE NEG for vegetations, prob need TEE to r/o endocarditis P:   Pulled central line (52 days old) 08/09/14 Changed R Picc to left Picc 08/11/14 Anbitiocs Vanc 07/29/14 >. 08/11/14 (rash) Continue Imipenem 11/15 >11.23  Continue diflucan 08/09/14 - candida tropicalis  Any decline will change diflucan to mycofungin, anticipate will need 21 days of fluconazole. Consider TEE  ENDOCRINE A:   No acute issues P:   SSI while on tube feeds  FAMILY  - Updates: no family in room 11/26  TODAY'S SUMMARY: Change lasix as above, restart PS trials, awaiting placement, not tolerating TC, would not place on TC.   Alyson Reedy, M.D. [facility] Pulmonary/Critical Care Medicine. Pager: [phone]. After hours pager: [phone].  10:59 AM 08/20/2014

## 2014-08-21 LAB — GLUCOSE, CAPILLARY
GLUCOSE-CAPILLARY: 223 mg/dL — AB (ref 70–99)
GLUCOSE-CAPILLARY: 223 mg/dL — AB (ref 70–99)
Glucose-Capillary: 199 mg/dL — ABNORMAL HIGH (ref 70–99)
Glucose-Capillary: 216 mg/dL — ABNORMAL HIGH (ref 70–99)
Glucose-Capillary: 230 mg/dL — ABNORMAL HIGH (ref 70–99)
Glucose-Capillary: 234 mg/dL — ABNORMAL HIGH (ref 70–99)

## 2014-08-21 LAB — CBC
HCT: 26.7 % — ABNORMAL LOW (ref 36.0–46.0)
HEMOGLOBIN: 7.8 g/dL — AB (ref 12.0–15.0)
MCH: 26.9 pg (ref 26.0–34.0)
MCHC: 29.2 g/dL — ABNORMAL LOW (ref 30.0–36.0)
MCV: 92.1 fL (ref 78.0–100.0)
Platelets: 474 10*3/uL — ABNORMAL HIGH (ref 150–400)
RBC: 2.9 MIL/uL — ABNORMAL LOW (ref 3.87–5.11)
RDW: 15.6 % — ABNORMAL HIGH (ref 11.5–15.5)
WBC: 14.4 10*3/uL — ABNORMAL HIGH (ref 4.0–10.5)

## 2014-08-21 LAB — BASIC METABOLIC PANEL
Anion gap: 10 (ref 5–15)
BUN: 33 mg/dL — ABNORMAL HIGH (ref 6–23)
CALCIUM: 9.6 mg/dL (ref 8.4–10.5)
CO2: 38 mEq/L — ABNORMAL HIGH (ref 19–32)
CREATININE: 0.65 mg/dL (ref 0.50–1.10)
Chloride: 98 mEq/L (ref 96–112)
GFR calc Af Amer: >90 mL/min (ref >90)
GFR calc non Af Amer: >90 mL/min (ref >90)
Glucose, Bld: 252 mg/dL — ABNORMAL HIGH (ref 70–99)
Potassium: 4.3 mEq/L (ref 3.7–5.3)
Sodium: 146 mEq/L (ref 137–147)

## 2014-08-21 LAB — MAGNESIUM: Magnesium: 2.3 mg/dL (ref 1.5–2.5)

## 2014-08-21 LAB — PHOSPHORUS: Phosphorus: 3.6 mg/dL (ref 2.3–4.6)

## 2014-08-21 MED ORDER — IPRATROPIUM-ALBUTEROL 0.5-2.5 (3) MG/3ML IN SOLN
3.0000 mL | Freq: Four times a day (QID) | RESPIRATORY_TRACT | Status: DC
  Administered 2014-08-21 – 2014-08-26 (×22): 3 mL via RESPIRATORY_TRACT
  Filled 2014-08-21 (×23): qty 3

## 2014-08-21 NOTE — Progress Notes (Signed)
Overall stable. No new issues. Hypernatremia improved. No new recommendations. Continue current management.

## 2014-08-21 NOTE — Progress Notes (Signed)
PULMONARY / CRITICAL CARE MEDICINE   Name: Stephanie Doyle MRN: [id number] DOB: [birth year]    ADMISSION DATE:  07/26/2014 CONSULTATION DATE:  07/26/2014  REFERRING MD :  Dr. Franky Macho  CHIEF COMPLAINT:  Headache  INITIAL PRESENTATION: 63 year old female with no known medical history presented to [facility] 11/2 with severe headache, found to have SAH. AMS in ED requiring intubation. She was transferred to [facility] for further eval. PCCM consulted for vent and ICU management.  STUDIES:  11/2 CT head > diffuse SAH, frontal and temporal bilaterally.  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 Status post anterior communicating artery aneurysm clipping; frontal lobe edema stable, small volume intraventricular hemorrhage stable, regression of SAH, no new intracranial abnormality  SIGNIFICANT EVENTS: 11/2[facility] Intubated for airway protection, transferred to [facility] NICU 11/4- Right pterional craniotomy for clipping of anterior communicating artery aneurysm, complex 11/5 - no cuff leak >> started steroids 11/7 not weaning , lowest ps was 12 11/12 trach > 11/14 hgb 6.6, no active sign of bleeding 11/15 worsening renal failure, vasopressin and neo added for BP goals 11/17 Hb 6.7 - 1 U. Afebrile. Excellent UO. More responsive 08/11/14: ID recommending TTE and PICC line changed to left  side. Continues to be on trach and vent. Anasarca +. Low grade fever 10F + 08/12/14 _ bilateral thigh rash/redness with eedema - ID stopped vancomycin and monitoring. Low grade fever +. Edema improved with lasix. Na holding at 156 with free water correction. Fails wean   SUBJECTIVE/OVERNIGHT/INTERVAL HX No events reported overnight  VITAL  SIGNS: Temp:  [97.9 F (36.6 C)-98.8 F (37.1 C)] 98.5 F (36.9 C) (11/28 0410) Pulse Rate:  [87-115] 114 (11/28 0410) Resp:  [12-25] 25 (11/28 0410) BP: (108-145)/(49-82) 145/82 mmHg (11/28 0410) SpO2:  [99 %-100 %] 100 % (11/28 0410) FiO2 (%):  [40 %] 40 % (11/28 0351) Weight:  [54.5 kg (120 lb 2.4 oz)] 54.5 kg (120 lb 2.4 oz) (11/28 0441)     VENTILATOR SETTINGS: Vent Mode:  [-] PRVC FiO2 (%):  [40 %] 40 % Set Rate:  [16 bmp] 16 bmp Vt Set:  [370 mL] 370 mL PEEP:  [5 cmH20] 5 cmH20 Pressure Support:  [16 cmH20] 16 cmH20 Plateau Pressure:  [13 cmH20-17 cmH20] 13 cmH20  INTAKE / OUTPUT:  Intake/Output Summary (Last 24 hours) at 08/21/14 0640 Last data filed at 08/20/14 2350  Gross per 24 hour  Intake   3010 ml  Output   1700 ml  Net   1310 ml    PHYSICAL EXAMINATION:  Gen: on vent, awake and interactive HEENT: scalp incision c/d/i, trach in place, clean PULM: decreased BS and coarse CV: s1 s2 Tachy regula Ab: BS+, soft Ext: massive edema/anasarca improved Derm: no breakdown but has redness bilateral anterior thigh Neuro:  RASS 0, follows commands , interactive  LABS: PULMONARY  Recent Labs Lab 08/19/14 0407  PHART 7.453*  PCO2ART 56.8*  PO2ART 119.0*  HCO3 39.2*  TCO2 40.9  O2SAT 98.8    CBC  Recent Labs Lab 08/16/14 0110 08/20/14 0545 08/21/14 0505  HGB 8.0* 7.7* 7.8*  HCT 26.9* 26.1* 26.7*  WBC 12.3* 10.3 14.4*  PLT 365 433* 474*    COAGULATION No results for input(s): INR  in the last 168 hours.  CARDIAC   No results for input(s): TROPONINI in the last 168 hours. No results for input(s): PROBNP in the last 168 hours.   CHEMISTRY  Recent Labs Lab 08/15/14 0445  08/16/14 0110 08/16/14 1930 08/17/14 0500 08/18/14 1630 08/20/14 0545 08/21/14 0505  NA 152*  < > 149* 149* 150* 148* 148* 146  K 4.5  < > 3.8 3.7 3.9 4.1 4.1 4.3  CL 112  < > 109 107 108 101 99 98  CO2 26  < > 28 33* 31 37* 42* 38*  GLUCOSE 149*  < > 148* 71 146*  116* 237* 252*  BUN 20  < > 20 21 20 23  26* 33*  CREATININE 0.82  < > 0.77 0.77 0.75 0.73 0.67 0.65  CALCIUM 8.9  < > 9.0 9.2 8.8 9.4 9.5 9.6  MG 2.1  --  2.1  --   --   --   --  2.3  PHOS 3.8  --  4.0  --   --   --   --  3.6  < > = values in this interval not displayed. Estimated Creatinine Clearance: 58 mL/min (by C-G formula based on Cr of 0.65).   LIVER No results for input(s): AST, ALT, ALKPHOS, BILITOT, PROT, ALBUMIN, INR in the last 168 hours.   INFECTIOUS No results for input(s): LATICACIDVEN, PROCALCITON in the last 168 hours.   ENDOCRINE CBG (last 3)   Recent Labs  08/20/14 2000 08/20/14 2350 08/21/14 0408  GLUCAP 289* 216* 223*    IMAGING x48h No results found.   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 Fentanyl gtt now off, PRN pushes at this time. Nimodipine now OFF Stopped STATIN  PULMONARY OETT 11/2 >>>trach   A: Acute hypoxemic respiratory failure > HCAP complicated by pulmonary edema  COPD, with mucus plugging, bronchoconstriction Concern for upper airway edema s/p tracheostomy 11/12 P:   Continue vent weaning as pt can tolerate Titrate O2 for sat of 88-92%. Continue steroids IV solumedrol 40 mg IV q12 hours > decrease 11/29 Change duoneb q6H  CARDIOVASCULAR Rt IJ CVL 11/03 >> A:  Septic shock, resolved HHH therapy in setting of SAH, completed Hypernatremia resolved.  Anasarca  New finding of moderate mitral regurg on echo 08/01/14  P:  Cont  free water Lasix 40 mg PO daily.  RENAL  A:   Hypernatremia, improving AKI > resolved  P:   Lasix 40 mg PO daily. Continue free water Replace electrolytes as indicated Follow BMP  GASTROINTESTINAL A:   Nutrition Ileus P:   Tube feeds   SUP: pepcid  HEMATOLOGIC A:   Anemia of critical illness >   monitor Leukocytosis. P: Limit phlebotomy SCD for DVT prevention  INFECTIOUS  Blood 11/02 >>NTD Blood 11/03 >>NTD Blood 11/04 >>NTD C diff 11/06 >> neg Sputum 11/2>>neg UC 11-2>>neg ==== Blood 11/15 >>yeast >>candida tropicalis 2/2 pos + glabrata (only speciated 11/27) Urine 11/15 >> yeast  Resp 11/15 >>NEG .............. Blood 08/10/14 >>NG  A:   Sepsis 11/04 > no clear source, treated with broad spectrum Abx for possible PNA Septic shock 11/15 due to fungemia, candida tropicalis poss line related, note late speciation glabrata 11/27 TTE NEG for vegetations, prob need TEE to r/o endocarditis P:   Pulled central line (12 days  old) 08/09/14 Changed R Picc to left Picc 08/11/14 Anbitiocs Vanc 07/29/14 >. 08/11/14 (rash) Continue Imipenem 11/15 >11.23  Continue diflucan 08/09/14 - candida tropicalis + glabrata  Because she has had good clinical response to fluconazole, will continue for now. If any decline will change diflucan to mycofungin, anticipate will need 21 days of fluconazole. Consider TEE  ENDOCRINE A:   No acute issues P:   SSI while on tube feeds  FAMILY  - Updates: no family in room 11/28  TODAY'S SUMMARY: working on PSV trials. Note new finding C glabrata + tropicalis on blood cx's 11/15  Levy Pupaobert Dmari Schubring, MD, PhD 08/21/2014, 6:48 AM [facility] [phone] or if no answer [phone]

## 2014-08-21 NOTE — Progress Notes (Signed)
Mittens off on rounds and replaced. Pt. Noted to be pulling at trach collar.

## 2014-08-21 NOTE — Progress Notes (Signed)
Air added to cuff by RT using the minimal cuff technique due to cuff leak.

## 2014-08-22 DIAGNOSIS — J449 Chronic obstructive pulmonary disease, unspecified: Secondary | ICD-10-CM

## 2014-08-22 LAB — GLUCOSE, CAPILLARY
Glucose-Capillary: 168 mg/dL — ABNORMAL HIGH (ref 70–99)
Glucose-Capillary: 182 mg/dL — ABNORMAL HIGH (ref 70–99)
Glucose-Capillary: 210 mg/dL — ABNORMAL HIGH (ref 70–99)
Glucose-Capillary: 214 mg/dL — ABNORMAL HIGH (ref 70–99)
Glucose-Capillary: 224 mg/dL — ABNORMAL HIGH (ref 70–99)
Glucose-Capillary: 295 mg/dL — ABNORMAL HIGH (ref 70–99)

## 2014-08-22 MED ORDER — ALPRAZOLAM 0.5 MG PO TABS
0.5000 mg | ORAL_TABLET | Freq: Three times a day (TID) | ORAL | Status: DC
  Administered 2014-08-22 – 2014-08-23 (×5): 0.5 mg
  Filled 2014-08-22 (×5): qty 1

## 2014-08-22 MED ORDER — INSULIN GLARGINE 100 UNIT/ML ~~LOC~~ SOLN
10.0000 [IU] | Freq: Two times a day (BID) | SUBCUTANEOUS | Status: DC
  Administered 2014-08-22 – 2014-08-26 (×9): 10 [IU] via SUBCUTANEOUS
  Filled 2014-08-22 (×10): qty 0.1

## 2014-08-22 MED ORDER — PREDNISONE 20 MG PO TABS
30.0000 mg | ORAL_TABLET | Freq: Every day | ORAL | Status: DC
  Administered 2014-08-22 – 2014-08-24 (×3): 30 mg
  Filled 2014-08-22 (×4): qty 1

## 2014-08-22 NOTE — Progress Notes (Signed)
Constantly removing mittens. Notified Dr. Delford FieldWright while here on rounds and medication ordered for anxiety.

## 2014-08-22 NOTE — Progress Notes (Addendum)
PULMONARY / CRITICAL CARE MEDICINE   Name: Stephanie Doyle MRN: [id number] DOB: Dec 19, 1950    ADMISSION DATE:  07/26/2014 CONSULTATION DATE:  07/26/2014  REFERRING MD :  Dr. Franky Machoabbell  CHIEF COMPLAINT:  Headache  INITIAL PRESENTATION: 63 year old female with no known medical history presented to [facility] 11/2 with severe headache, found to have SAH. AMS in ED requiring intubation. [facility]. PCCM consulted for vent and ICU management.  STUDIES:  11/2 CT head > diffuse SAH, frontal and temporal bilaterally.  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 Status post anterior communicating artery aneurysm clipping; frontal lobe edema stable, small volume intraventricular hemorrhage stable, regression of SAH, no new intracranial abnormality  SIGNIFICANT EVENTS: 11/2[facility]- SAH, Intubated for airway protection, [facility] 11/4- Right pterional craniotomy for clipping of anterior communicating artery aneurysm, complex 11/5 - no cuff leak >> started steroids 11/7 not weaning , lowest ps was 12 11/12 trach > 11/14 hgb 6.6, no active sign of bleeding 11/15 worsening renal failure, vasopressin and neo added for BP goals 11/17 Hb 6.7 - 1 U. Afebrile. Excellent UO. More responsive 08/11/14: ID recommending TTE and PICC line changed to left  side. Continues to be on trach and vent. Anasarca +. Low grade fever 7F + 08/12/14 _ bilateral thigh rash/redness with eedema - ID stopped vancomycin and monitoring. Low grade fever +. Edema improved with lasix. Na holding at 156 with free water correction. Fails wean   SUBJECTIVE/OVERNIGHT/INTERVAL HX No events reported overnight  VITAL  SIGNS: Temp:  [98 F (36.7 C)-99.1 F (37.3 C)] 98.1 F (36.7 C) (11/29 0337) Pulse Rate:  [101-128] 109 (11/29 0337) Resp:  [16-35] 23 (11/29 0337) BP: (99-156)/(47-89) 132/81 mmHg (11/29 0600) SpO2:  [99 %-100 %] 100 % (11/29 0337) FiO2 (%):  [40 %] 40 % (11/29 0320) Weight:  [51.9 kg (114 lb 6.7 oz)] 51.9 kg (114 lb 6.7 oz) (11/29 0337)     VENTILATOR SETTINGS: Vent Mode:  [-] PRVC FiO2 (%):  [40 %] 40 % Set Rate:  [16 bmp] 16 bmp Vt Set:  [370 mL] 370 mL PEEP:  [5 cmH20] 5 cmH20 Plateau Pressure:  [14 cmH20-17 cmH20] 17 cmH20  INTAKE / OUTPUT:  Intake/Output Summary (Last 24 hours) at 08/22/14 0804 Last data filed at 08/22/14 0344  Gross per 24 hour  Intake      0 ml  Output   3750 ml  Net  -3750 ml    PHYSICAL EXAMINATION:  Gen: on vent, awake and interactive HEENT: scalp incision c/d/i, trach in place, clean PULM: decreased BS and coarse CV: s1 s2 Tachy regula Ab: BS+, soft Ext: massive edema/anasarca improved Derm: no breakdown but has redness bilateral anterior thigh Neuro:  RASS 0, follows commands , interactive  LABS: PULMONARY  Recent Labs Lab 08/19/14 0407  PHART 7.453*  PCO2ART 56.8*  PO2ART 119.0*  HCO3 39.2*  TCO2 40.9  O2SAT 98.8    CBC  Recent Labs Lab 08/16/14 0110 08/20/14 0545 08/21/14 0505  HGB 8.0* 7.7* 7.8*  HCT 26.9* 26.1* 26.7*  WBC 12.3* 10.3 14.4*  PLT 365 433* 474*    COAGULATION No results for input(s): INR in the last 168 hours.  CARDIAC   No results for input(s): TROPONINI in the last 168 hours. No results for input(s): PROBNP in the last 168 hours.   CHEMISTRY  Recent Labs Lab 08/16/14 0110 08/16/14 1930 08/17/14 0500 08/18/14 1630 08/20/14 0545 08/21/14 0505  NA 149* 149* 150* 148* 148* 146  K 3.8 3.7 3.9 4.1 4.1 4.3  CL 109 107 108 101 99 98  CO2 28 33* 31 37* 42* 38*  GLUCOSE 148* 71 146* 116* 237* 252*  BUN 20 21 20 23  26* 33*  CREATININE 0.77 0.77 0.75 0.73 0.67 0.65  CALCIUM 9.0 9.2  8.8 9.4 9.5 9.6  MG 2.1  --   --   --   --  2.3  PHOS 4.0  --   --   --   --  3.6   Estimated Creatinine Clearance: 57.4 mL/min (by C-G formula based on Cr of 0.65).   LIVER No results for input(s): AST, ALT, ALKPHOS, BILITOT, PROT, ALBUMIN, INR in the last 168 hours.   INFECTIOUS No results for input(s): LATICACIDVEN, PROCALCITON in the last 168 hours.   ENDOCRINE CBG (last 3)   Recent Labs  08/21/14 1925 08/22/14 0015 08/22/14 0343  GLUCAP 199* 214* 224*    IMAGING x48h No results found.   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 Fentanyl gtt now off, PRN pushes at this time. Nimodipine now OFF Stopped STATIN  PULMONARY OETT 11/2 >>>trach   A: Acute hypoxemic respiratory failure > HCAP complicated by pulmonary edema  COPD, with mucus plugging, bronchoconstriction Concern for upper airway edema s/p tracheostomy 11/12 P:   Continue vent weaning as pt can tolerate Titrate O2 for sat of 88-92%. D/c iv steroids 11/29, change to per tube prednisone and rapid taper Cont  duoneb q6H  CARDIOVASCULAR Rt IJ CVL 11/03 >> A:  Septic shock, resolved HHH therapy in setting of SAH, completed Hypernatremia resolved.  Anasarca  New finding of moderate mitral regurg on echo 08/01/14  P:  Cont  free water Lasix 40 mg PO daily.  RENAL  A:   Hypernatremia, improving AKI > resolved  P:   Lasix 40 mg PO daily. Continue free water Replace electrolytes as indicated Follow BMP  GASTROINTESTINAL A:   Nutrition Ileus P:   Tube feeds   SUP: pepcid  HEMATOLOGIC A:   Anemia of critical illness >  monitor Leukocytosis. P: Limit phlebotomy SCD for DVT prevention  INFECTIOUS  Blood 11/02 >>NTD Blood 11/03 >>NTD Blood 11/04 >>NTD C diff 11/06 >> neg Sputum 11/2>>neg UC  11-2>>neg ==== Blood 11/15 >>yeast >>candida tropicalis 2/2 pos + glabrata (only speciated 11/27) Urine 11/15 >> yeast  Resp 11/15 >>NEG .............. Blood 08/10/14 >>NG  A:   Sepsis 11/04 > no clear source, treated with broad spectrum Abx for possible PNA Septic shock 11/15 due to fungemia, candida tropicalis poss line related, note late speciation glabrata 11/27 TTE NEG for vegetations, prob need TEE to r/o endocarditis P:   Pulled central line (49 days old) 08/09/14 Changed R Picc to left Picc 08/11/14 Anbitiocs Vanc 07/29/14 >. 08/11/14 (rash) Continue Imipenem 11/15 >11.23  Continue diflucan 08/09/14 - candida tropicalis + glabrata  Because she has had good clinical response to fluconazole, will continue for now. If any decline will change diflucan to mycofungin, anticipate will need 21 days of  fluconazole. If declines again, change to mycofungin and obtain TEE. TTE was neg   ENDOCRINE A:   Hyperglycemia steroid induced P:   SSI while on tube feeds Add lantus 10units bid while on steroids  NEUROLOGY: A: 11/2- SAH, Intubated for airway protection, [facility] 11[facility]- Right pterional craniotomy for clipping of anterior communicating artery aneurysm, complex Mild anxiety but neuro status improving daily  P: Per neurosurgery Add anxiolytic  FAMILY  - Updates: no family in room 11/29  TODAY'S SUMMARY: working on PSV trials. Taper steroids. Add xanax for anxiety issues   Dorcas Carrowatrick WrightMD 08/22/2014, 8:04 AM [facility]  [phone]  Cell  [phone]  If no response or cell goes to voicemail, call beeper [phone]

## 2014-08-22 NOTE — Progress Notes (Signed)
ANTIBIOTIC CONSULT NOTE - FOLLOW UP  Pharmacy Consult for Fluconazole Indication: Fungemia  Allergies  Allergen Reactions  . Vancomycin     Pt had red rash all over body after vanc was given, this was told to me in report but had not been added to the allergy list     Patient Measurements: Height: 5\' 3"  (160 cm) Weight: 114 lb 6.7 oz (51.9 kg) IBW/kg (Calculated) : 52.4  Vital Signs: Temp: 98.1 F (36.7 C) (11/29 0337) Temp Source: Oral (11/29 0337) BP: 132/81 mmHg (11/29 0600) Pulse Rate: 109 (11/29 0337) Intake/Output from previous day: 11/28 0701 - 11/29 0700 In: -  Out: 4150 [Urine:4150] Intake/Output from this shift:    Labs:  Recent Labs  08/20/14 0545 08/21/14 0505  WBC 10.3 14.4*  HGB 7.7* 7.8*  PLT 433* 474*  CREATININE 0.67 0.65   Estimated Creatinine Clearance: 57.4 mL/min (by C-G formula based on Cr of 0.65). No results for input(s): VANCOTROUGH, VANCOPEAK, VANCORANDOM, GENTTROUGH, GENTPEAK, GENTRANDOM, TOBRATROUGH, TOBRAPEAK, TOBRARND, AMIKACINPEAK, AMIKACINTROU, AMIKACIN in the last 72 hours.   Microbiology: Recent Results (from the past 720 hour(s))  MRSA PCR Screening     Status: None   Collection Time: 07/26/14  7:58 PM  Result Value Ref Range Status   MRSA by PCR NEGATIVE NEGATIVE Final    Comment:        The GeneXpert MRSA Assay (FDA approved for NASAL specimens only), is one component of a comprehensive MRSA colonization surveillance program. It is not intended to diagnose MRSA infection nor to guide or monitor treatment for MRSA infections.   Culture, Urine     Status: None   Collection Time: 07/26/14 11:12 PM  Result Value Ref Range Status   Specimen Description URINE, CATHETERIZED  Final   Special Requests NONE  Final   Culture  Setup Time   Final    07/27/2014 10:07 Performed at [facility]    Colony Count NO GROWTH Performed at [facility]   Final   Culture NO GROWTH Performed at [facility]    Final   Report Status 07/28/2014 FINAL  Final  Culture, blood (routine x 2)     Status: None   Collection Time: 07/26/14 11:49 PM  Result Value Ref Range Status   Specimen Description BLOOD LEFT ARM  Final   Special Requests BOTTLES DRAWN AEROBIC ONLY 5CC  Final   Culture  Setup Time   Final    07/27/2014 08:58 Performed at [facility]    Culture   Final    NO GROWTH 5 DAYS Performed at [facility]    Report Status 08/02/2014 FINAL  Final  Culture, respiratory (NON-Expectorated)     Status: None   Collection Time: 07/26/14 11:52 PM  Result Value Ref Range Status   Specimen Description TRACHEAL ASPIRATE  Final   Special Requests NONE  Final   Gram Stain   Final    RARE WBC PRESENT, PREDOMINANTLY PMN RARE SQUAMOUS EPITHELIAL CELLS PRESENT RARE GRAM POSITIVE COCCI IN PAIRS Performed at [facility]    Culture   Final    Non-Pathogenic Oropharyngeal-type Flora Isolated. Performed at [facility]    Report Status 07/29/2014 FINAL  Final  Culture, blood (routine x 2)     Status: None   Collection Time: 07/27/14 12:04 AM  Result Value Ref Range Status   Specimen Description BLOOD LEFT HAND  Final   Special Requests BOTTLES DRAWN AEROBIC ONLY 4CC  Final  Culture  Setup Time   Final    07/27/2014 08:58 Performed at [facility]    Culture   Final    NO GROWTH 5 DAYS Performed at [facility]    Report Status 08/02/2014 FINAL  Final  Culture, respiratory (NON-Expectorated)     Status: None   Collection Time: 07/28/14  8:49 AM  Result Value Ref Range Status   Specimen Description TRACHEAL ASPIRATE  Final   Special Requests Normal  Final   Gram Stain   Final    FEW WBC PRESENT,BOTH PMN AND MONONUCLEAR NO SQUAMOUS EPITHELIAL CELLS SEEN NO ORGANISMS SEEN Performed at [facility]    Culture   Final    Non-Pathogenic Oropharyngeal-type Flora Isolated. Performed at [facility]    Report Status  07/30/2014 FINAL  Final  Culture, blood (routine x 2)     Status: None   Collection Time: 07/28/14  9:43 AM  Result Value Ref Range Status   Specimen Description BLOOD LEFT HAND  Final   Special Requests BOTTLES DRAWN AEROBIC ONLY 10CC  Final   Culture  Setup Time   Final    07/28/2014 15:42 Performed at [facility]    Culture   Final    NO GROWTH 5 DAYS Performed at [facility]    Report Status 08/03/2014 FINAL  Final  Culture, blood (routine x 2)     Status: None   Collection Time: 07/28/14 10:00 AM  Result Value Ref Range Status   Specimen Description BLOOD LEFT HAND  Final   Special Requests BOTTLES DRAWN AEROBIC ONLY 8CC  Final   Culture  Setup Time   Final    07/28/2014 15:42 Performed at [facility]    Culture   Final    NO GROWTH 5 DAYS Performed at [facility]    Report Status 08/03/2014 FINAL  Final  Clostridium Difficile by PCR     Status: None   Collection Time: 07/30/14  8:06 AM  Result Value Ref Range Status   C difficile by pcr NEGATIVE NEGATIVE Final  Culture, blood (routine x 2)     Status: None   Collection Time: 08/08/14  9:40 AM  Result Value Ref Range Status   Specimen Description BLOOD LEFT ARM  Final   Special Requests BOTTLES DRAWN AEROBIC ONLY 2CC  Final   Culture  Setup Time   Final    08/08/2014 15:37 Performed at [facility]    Culture   Final    CANDIDA GLABRATA Note: Gram Stain Report Called to,Read Back By and Verified With: BETHANY@10 :08AM ON 08/13/14 BY DANTS Performed at [facility]    Report Status 08/20/2014 FINAL  Final  Culture, blood (routine x 2)     Status: None   Collection Time: 08/08/14 10:20 AM  Result Value Ref Range Status   Specimen Description BLOOD LEFT HAND  Final   Special Requests BOTTLES DRAWN AEROBIC AND ANAEROBIC 5CC  Final   Culture  Setup Time   Final    08/08/2014 19:28 Performed at [facility]    Culture   Final    CANDIDA  TROPICALIS Note: Culture results may be compromised due to an excessive volume of blood received in culture bottles. Gram Stain Report Called to,Read Back By and Verified With: MARK FIBENGE ON 08/09/2014 AT 9:48P BY Serafina Mitchell Performed at [facility]    Report Status 08/16/2014 FINAL  Final  Culture, Urine  Status: None   Collection Time: 08/08/14 10:28 AM  Result Value Ref Range Status   Specimen Description URINE, CATHETERIZED  Final   Special Requests NONE  Final   Culture  Setup Time   Final    08/08/2014 20:36 [facility]    Colony Count   Final    >=100,000 COLONIES/ML [facility]    Culture YEAST [facility]   Final   Report Status 08/10/2014 FINAL  Final  Culture, respiratory (NON-Expectorated)     Status: None   Collection Time: 08/08/14 11:21 AM  Result Value Ref Range Status   Specimen Description TRACHEAL ASPIRATE  Final   Special Requests Normal  Final   Gram Stain   Final    MODERATE WBC PRESENT,BOTH PMN AND MONONUCLEAR RARE SQUAMOUS EPITHELIAL CELLS PRESENT NO ORGANISMS SEEN [facility]    Culture   Final    Non-Pathogenic Oropharyngeal-type Flora Isolated. [facility]    Report Status 08/11/2014 FINAL  Final  Culture, blood (routine x 2)     Status: None   Collection Time: 08/10/14  5:00 PM  Result Value Ref Range Status   Specimen Description BLOOD LEFT ARM  Final   Special Requests BOTTLES DRAWN AEROBIC ONLY 10CC  Final   Culture  Setup Time   Final    08/11/2014 00:46 [facility]    Culture   Final    NO GROWTH 5 DAYS [facility]    Report Status 08/17/2014 FINAL  Final  Culture, blood (routine x 2)     Status: None   Collection Time: 08/10/14  5:05 PM  Result Value Ref Range Status   Specimen Description BLOOD LEFT ARM  Final   Special Requests BOTTLES DRAWN AEROBIC ONLY 2CC  Final    Culture  Setup Time   Final    08/11/2014 00:46 [facility]    Culture   Final    NO GROWTH 5 DAYS [facility]    Report Status 08/17/2014 FINAL  Final  Clostridium Difficile by PCR     Status: None   Collection Time: 08/13/14  5:35 PM  Result Value Ref Range Status   C difficile by pcr NEGATIVE NEGATIVE Final    Anti-infectives    Start     Dose/Rate Route Frequency Ordered Stop   08/17/14 2200  fluconazole (DIFLUCAN) 40 MG/ML suspension 400 mg     400 mg Per Tube Daily at bedtime 08/17/14 0754     08/13/14 1600  imipenem-cilastatin (PRIMAXIN) 250 mg in sodium chloride 0.9 % 100 mL IVPB     250 mg200 mL/hr over 30 Minutes Intravenous Every 6 hours 08/13/14 0941 08/14/14 2140   08/10/14 2200  fluconazole (DIFLUCAN) IVPB 400 mg  Status:  Discontinued     400 mg100 mL/hr over 120 Minutes Intravenous Every 24 hours 08/09/14 2158 08/17/14 0753   08/10/14 1000  micafungin (MYCAMINE) 100 mg in sodium chloride 0.9 % 100 mL IVPB  Status:  Discontinued     100 mg100 mL/hr over 1 Hours Intravenous Daily 08/10/14 0833 08/10/14 0833   08/09/14 2200  fluconazole (DIFLUCAN) IVPB 800 mg     800 mg200 mL/hr over 120 Minutes Intravenous  Once 08/09/14 2158 08/10/14 0059   08/09/14 1800  vancomycin (VANCOCIN) IVPB 750 mg/150 ml premix  Status:  Discontinued     750  mg150 mL/hr over 60 Minutes Intravenous Every 12 hours 08/09/14 0913 08/11/14 1510   08/09/14 1000  imipenem-cilastatin (PRIMAXIN) 500 mg in sodium chloride 0.9 % 100 mL IVPB  Status:  Discontinued     500 mg200 mL/hr over 30 Minutes Intravenous Every 8 hours 08/09/14 0911 08/13/14 0941   08/08/14 1030  piperacillin-tazobactam (ZOSYN) IVPB 3.375 g  Status:  Discontinued     3.375 g12.5 mL/hr over 240 Minutes Intravenous Every 8 hours 08/08/14 0933 08/08/14 0935   08/08/14 1030  vancomycin (VANCOCIN) IVPB 1000 mg/200 mL premix     1,000 mg200 mL/hr over 60 Minutes Intravenous Every 24 hours  08/08/14 0933 08/09/14 1052   08/08/14 1030  imipenem-cilastatin (PRIMAXIN) 250 mg in sodium chloride 0.9 % 100 mL IVPB  Status:  Discontinued     250 mg200 mL/hr over 30 Minutes Intravenous Every 6 hours 08/08/14 0938 08/09/14 0910   07/29/14 0930  vancomycin (VANCOCIN) IVPB 1000 mg/200 mL premix  Status:  Discontinued     1,000 mg200 mL/hr over 60 Minutes Intravenous Every 24 hours 07/28/14 0941 08/04/14 1026   07/28/14 0930  piperacillin-tazobactam (ZOSYN) IVPB 3.375 g  Status:  Discontinued     3.375 g12.5 mL/hr over 240 Minutes Intravenous Every 8 hours 07/28/14 0916 08/04/14 1026   07/28/14 0930  vancomycin (VANCOCIN) IVPB 1000 mg/200 mL premix     1,000 mg200 mL/hr over 60 Minutes Intravenous  Once 07/28/14 0916 07/28/14 1100   07/27/14 1704  bacitracin 50,000 Units in sodium chloride irrigation 0.9 % 500 mL irrigation  Status:  Discontinued       As needed 07/27/14 1704 07/27/14 2111   07/26/14 2300  ampicillin-sulbactam (UNASYN) 1.5 g in sodium chloride 0.9 % 50 mL IVPB  Status:  Discontinued     1.5 g100 mL/hr over 30 Minutes Intravenous Every 6 hours 07/26/14 2213 07/28/14 0422      Assessment: 63 YOF admitted on 07/26/2014 with SAH On flucon D# 12 for fungemia for minimum of 2 weeks (loaded appropriately), Vanc stopped 11/18 d/t drug rash, S/p 7d Imi for HCAP. ID on board - thinks fungemia d/t line infection. CVL pulled 11/16, PICC pulled 11/18 and new PICC on opposite arm; Unable to do line holiday; TTE done 11/18 - inadequate to r/o IE. Repeat cultures have remained neg. Plan is for 2 wks per ID  Unasyn 11/2>>11/4 Vanc 11/4>>11/11; 11/15>>11/18 (rash, redness, edema) Zosyn 11/4>>11/11 Imipenem 11/15>> (11/21) Fluconazole 11/16 >>  Plan  Fluconazole 400mg  PO qday  Ulyses SouthwardMinh Lilianah Buffin, PharmD Pager: [phone] 08/22/2014 7:42 AM

## 2014-08-23 LAB — GLUCOSE, CAPILLARY
GLUCOSE-CAPILLARY: 121 mg/dL — AB (ref 70–99)
GLUCOSE-CAPILLARY: 149 mg/dL — AB (ref 70–99)
Glucose-Capillary: 135 mg/dL — ABNORMAL HIGH (ref 70–99)
Glucose-Capillary: 137 mg/dL — ABNORMAL HIGH (ref 70–99)
Glucose-Capillary: 225 mg/dL — ABNORMAL HIGH (ref 70–99)
Glucose-Capillary: 94 mg/dL (ref 70–99)

## 2014-08-23 MED ORDER — SODIUM CHLORIDE 0.9 % IV BOLUS (SEPSIS)
500.0000 mL | Freq: Once | INTRAVENOUS | Status: AC
  Administered 2014-08-23: 500 mL via INTRAVENOUS

## 2014-08-23 MED ORDER — ALPRAZOLAM 0.25 MG PO TABS
0.2500 mg | ORAL_TABLET | Freq: Three times a day (TID) | ORAL | Status: DC
  Administered 2014-08-23 – 2014-08-26 (×9): 0.25 mg
  Filled 2014-08-23 (×9): qty 1

## 2014-08-23 NOTE — Progress Notes (Signed)
PULMONARY / CRITICAL CARE MEDICINE   Name: Stephanie Doyle MRN: [id number] DOB: [birth year]    ADMISSION DATE:  07/26/2014 CONSULTATION DATE:  07/26/2014  REFERRING MD :  Dr. Franky Macho  CHIEF COMPLAINT:  Headache  INITIAL PRESENTATION: 63 year old female with no known medical history presented to [facility] 11/2 with severe headache, found to have SAH. AMS in ED requiring intubation. She was transferred to [facility] for further eval. PCCM consulted for vent and ICU management.  STUDIES:  11/2 CT head > diffuse SAH, frontal and temporal bilaterally.  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 Status post anterior communicating artery aneurysm clipping; frontal lobe edema stable, small volume intraventricular hemorrhage stable, regression of SAH, no new intracranial abnormality  SIGNIFICANT EVENTS: 11/2[facility], Intubated for airway protection, transferred to [facility] NICU 11/4- Right pterional craniotomy for clipping of anterior communicating artery aneurysm, complex 11/5 - no cuff leak >> started steroids 11/7 not weaning , lowest ps was 12 11/12 trach > 11/14 hgb 6.6, no active sign of bleeding 11/15 worsening renal failure, vasopressin and neo added for BP goals 11/17 Hb 6.7 - 1 U. Afebrile. Excellent UO. More responsive 08/11/14: ID recommending TTE and PICC line changed to left  side. Continues to be on trach and vent. Anasarca +. Low grade fever 60F + 08/12/14 _ bilateral thigh rash/redness with eedema - ID stopped vancomycin and monitoring. Low grade fever +. Edema improved with lasix. Na holding at 156 with free water correction. Fails wean   SUBJECTIVE/OVERNIGHT/INTERVAL HX No events reported overnight.  Cont to fail  weaning attempts. Vt 200 PSV 15/5  VITAL SIGNS: Temp:  [98.4 F (36.9 C)-98.7 F (37.1 C)] 98.7 F (37.1 C) (11/30 0731) Pulse Rate:  [87-116] 90 (11/30 0843) Resp:  [16-23] 18 (11/30 0843) BP: (98-142)/(47-68) 104/50 mmHg (11/30 0843) SpO2:  [100 %] 100 % (11/30 0843) FiO2 (%):  [40 %] 40 % (11/30 0843) Weight:  [114 lb 10.2 oz (52 kg)] 114 lb 10.2 oz (52 kg) (11/30 0354)     VENTILATOR SETTINGS: Vent Mode:  [-] PRVC FiO2 (%):  [40 %] 40 % Set Rate:  [16 bmp] 16 bmp Vt Set:  [370 mL] 370 mL PEEP:  [5 cmH20] 5 cmH20 Plateau Pressure:  [14 cmH20-21 cmH20] 18 cmH20  INTAKE / OUTPUT:  Intake/Output Summary (Last 24 hours) at 08/23/14 1121 Last data filed at 08/23/14 1018  Gross per 24 hour  Intake   1370 ml  Output   3600 ml  Net  -2230 ml    PHYSICAL EXAMINATION:  Gen: on vent, somnolent  HEENT: scalp incision c/d/i, trach in place, clean PULM: decreased BS and coarse CV: s1 s2 Tachy regula Ab: BS+, soft Ext: edema/anasarca improved Derm: no breakdown but has redness bilateral anterior thigh Neuro:  RASS 0,does not follow commands  LABS: PULMONARY  Recent Labs Lab 08/19/14 0407  PHART 7.453*  PCO2ART 56.8*  PO2ART 119.0*  HCO3 39.2*  TCO2 40.9  O2SAT 98.8    CBC  Recent Labs Lab 08/20/14 0545 08/21/14 0505  HGB 7.7* 7.8*  HCT 26.1* 26.7*  WBC 10.3 14.4*  PLT 433* 474*    COAGULATION No results for input(s): INR in the last 168 hours.  CARDIAC  No results for input(s): TROPONINI in the last 168 hours. No results for input(s): PROBNP in the last 168 hours.   CHEMISTRY  Recent Labs Lab 08/16/14 1930 08/17/14 0500 08/18/14 1630 08/20/14 0545 08/21/14 0505  NA 149* 150* 148* 148* 146  K 3.7 3.9 4.1 4.1 4.3  CL 107 108 101 99 98  CO2 33* 31 37* 42* 38*  GLUCOSE 71 146* 116* 237* 252*  BUN 21 20 23  26* 33*  CREATININE 0.77 0.75 0.73 0.67 0.65  CALCIUM 9.2 8.8 9.4 9.5 9.6  MG  --   --   --   --  2.3  PHOS  --   --   --   --  3.6    Estimated Creatinine Clearance: 57.6 mL/min (by C-G formula based on Cr of 0.65).   LIVER No results for input(s): AST, ALT, ALKPHOS, BILITOT, PROT, ALBUMIN, INR in the last 168 hours.   INFECTIOUS No results for input(s): LATICACIDVEN, PROCALCITON in the last 168 hours.   ENDOCRINE CBG (last 3)   Recent Labs  08/22/14 2359 08/23/14 0357 08/23/14 0733  GLUCAP 121* 135* 137*    IMAGING x48h No results found.   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 Fentanyl gtt now off, PRN pushes at this time. Nimodipine, statin now off   PULMONARY OETT 11/2 >>>trach   A: Acute hypoxemic respiratory failure > HCAP complicated by pulmonary edema  COPD, with mucus plugging, bronchoconstriction Concern for upper airway edema s/p tracheostomy 11/12 P:   Continue attempts at vent weaning - not tolerating  Titrate O2 for sat of 88-92%. Per tube prednisone with rapid taper Cont  duoneb q6H  CARDIOVASCULAR Rt IJ CVL 11/03 >> A:  Septic shock, resolved HHH therapy in setting of SAH, completed Hypernatremia resolved.  Anasarca  New finding of moderate mitral regurg on echo 08/01/14  P:  Cont  free water Lasix 40 mg PO daily.  RENAL  A:   Hypernatremia, improving AKI > resolved  P:   Lasix 40 mg PO daily. Continue free water Replace electrolytes as indicated Follow BMP  GASTROINTESTINAL A:   Nutrition Ileus P:   Tube feeds   SUP: pepcid  HEMATOLOGIC A:   Anemia of critical illness >  monitor Leukocytosis. P: Limit phlebotomy SCD for DVT prevention  INFECTIOUS  Blood 11/02 >>NTD Blood 11/03 >>NTD Blood 11/04 >>NTD C diff 11/06 >> neg Sputum 11/2>>neg UC 11-2>>neg ==== Blood 11/15 >>yeast >>candida tropicalis 2/2 pos + glabrata (only speciated 11/27) Urine 11/15 >> yeast   Resp 11/15 >>NEG .............. Blood 08/10/14 >>NG  A:   Sepsis 11/04 > no clear source, treated with broad spectrum Abx for possible PNA Septic shock 11/15 due to fungemia, candida tropicalis poss line related, note late speciation glabrata 11/27 TTE NEG for vegetations, prob need TEE to r/o endocarditis P:   Pulled central line (4812 days old) 08/09/14 Changed R Picc to left Picc 08/11/14 Anbitiocs Vanc 07/29/14 >. 08/11/14 (rash) Continue Imipenem 11/15 >11.23  Continue diflucan 08/09/14 - candida tropicalis + glabrata  - Because she has had good clinical response to fluconazole, now f/u BC both negative, will continue for now. If any decline will change diflucan to mycofungin and TEE - anticipate will need 21 days of fluconazole.  ENDOCRINE A:   Hyperglycemia steroid induced P:  SSI while on tube feeds Add lantus 10units bid while on steroids  NEUROLOGY: A: 11/2- SAH, Intubated for airway protection, [facility] 11[facility]4- Right pterional craniotomy for clipping of anterior communicating artery aneurysm, complex Mild anxiety but neuro status improving daily  P: Per neurosurgery Add anxiolytic  FAMILY  - Updates: sister updated 11/30  TODAY'S SUMMARY: Not tol weaning.  ?consider LTAC.   Dirk DressKaty Whiteheart, NP 08/23/2014  11:21 AM Pager: [phone] or [phone]  Continue diflucan for 21 days given fungemia, will continue to push weaning trials.  Taper prednisone.  Needs LTAC placement.  Patient seen and examined, agree with above note.  I dictated the care and orders written for this patient under my direction.  Alyson ReedyWesam G Tearsa Kowalewski, MD [phone]

## 2014-08-23 NOTE — Progress Notes (Signed)
Call placed to [facility] to address low BP orders received

## 2014-08-23 NOTE — Progress Notes (Signed)
Requested to assess this pt's PICC line that is "looped" underneath the dressing. Arrived to find that the PICC is out to the 11 cm mark (when it was placed it was left out to the 0 mark).  Part of the catheter is outside of the dressing. The dressing is dated "08/18/14 with the initials of SS.  I advised the nurse to contact the MD to get an order to remove the PICC line or to get a chest xray order to check for tip placement.  The PICC line is not currently being used. Stephanie Doyle, Stephanie Doyle M

## 2014-08-23 NOTE — Progress Notes (Addendum)
Per MD order, PICC line removed. Cath intact at 35cm. Vaseline pressure gauze to site, pressure held x 5min. No bleeding to site. Consuello Masseimmons, Catheryne Deford M

## 2014-08-23 NOTE — Progress Notes (Signed)
eLink Physician-Brief Progress Note Patient Name: Stephanie SilvasVicky D Stepka DOB: [birth year] MRN: [id number]   Date of Service  08/23/2014  HPI/Events of Note  Notified that pt has decreased ability to interact with RN today, corresponds with addition of her scheduled xanax 11/29. She is also on a fentanyl patch for the last week. Also, her PICC has apparently been accidentally pulled ~ 10cm, just discovered. She is not on any vasoactive gtt's anymore.   eICU Interventions  - will get alternative IV access and plan to pull PICC - will decrease the xanax to 0.25mg  q8h and follow her anxiety / sedation level.      Intervention Category Minor Interventions: Routine modifications to care plan (e.g. PRN medications for pain, fever)  Renella Steig S. 08/23/2014, 4:48 PM

## 2014-08-23 NOTE — Progress Notes (Signed)
Call to E-link pt's bp has been soft all day and now reading is 83/35 hr NSR 85 rr 16 02 sats 100 %  Trach care done New IV access has been done # 20 Lt hand no change in neuro status but still drowsy.

## 2014-08-23 NOTE — Progress Notes (Signed)
Started  500 cc NS bolus IV

## 2014-08-24 LAB — BASIC METABOLIC PANEL
Anion gap: 9 (ref 5–15)
BUN: 49 mg/dL — AB (ref 6–23)
CALCIUM: 9.1 mg/dL (ref 8.4–10.5)
CHLORIDE: 97 meq/L (ref 96–112)
CO2: 39 mEq/L — ABNORMAL HIGH (ref 19–32)
Creatinine, Ser: 0.78 mg/dL (ref 0.50–1.10)
GFR calc non Af Amer: 86 mL/min — ABNORMAL LOW (ref >90)
Glucose, Bld: 148 mg/dL — ABNORMAL HIGH (ref 70–99)
Potassium: 4 mEq/L (ref 3.7–5.3)
Sodium: 145 mEq/L (ref 137–147)

## 2014-08-24 LAB — GLUCOSE, CAPILLARY
GLUCOSE-CAPILLARY: 159 mg/dL — AB (ref 70–99)
GLUCOSE-CAPILLARY: 242 mg/dL — AB (ref 70–99)
GLUCOSE-CAPILLARY: 143 mg/dL — AB (ref 70–99)
GLUCOSE-CAPILLARY: 111 mg/dL — AB (ref 70–99)
Glucose-Capillary: 153 mg/dL — ABNORMAL HIGH (ref 70–99)
Glucose-Capillary: 143 mg/dL — ABNORMAL HIGH (ref 70–99)
Glucose-Capillary: 181 mg/dL — ABNORMAL HIGH (ref 70–99)

## 2014-08-24 MED ORDER — PREDNISONE 20 MG PO TABS
20.0000 mg | ORAL_TABLET | Freq: Every day | ORAL | Status: DC
  Administered 2014-08-25 – 2014-08-26 (×2): 20 mg
  Filled 2014-08-24 (×3): qty 1

## 2014-08-24 NOTE — Progress Notes (Signed)
No issues overnight.   EXAM:  BP 121/56 mmHg  Pulse 97  Temp(Src) 98.7 F (37.1 C) (Oral)  Resp 30  Ht 5\' 3"  (1.6 m)  Wt 50.9 kg (112 lb 3.4 oz)  BMI 19.88 kg/m2  SpO2 100%  Awake, alert Moves RUE/LUE/RLE spontaneously Occasionally follows simple commands Wound c/d/i  IMPRESSION:  63 y.o. female s/p SAH with Acom clipping Relatively stable from neurologic standpoint VDRF  PLAN: - Will likely need LTACH placement

## 2014-08-24 NOTE — Progress Notes (Signed)
Speech Language Pathology Treatment: Cognitive-Linquistic  Patient Details Name: Stephanie Doyle Wiemers MRN: [id number] DOB: 1950/10/21 Today's Date: 08/24/2014 Time: [id number] SLP Time Calculation (min) (ACUTE ONLY): 23 min  Assessment / Plan / Recommendation Clinical Impression  Pt requires Max cues from SLP for initiation of tasks, with fleeting periods of sustained attention during functional ADL. SLP and NT assisted pt with cleaning and changing of bed linens with Max-Total A required for following one-step commands throughout bed mobility. Pt responded to ~50% of yes/no questions with head nods, shaking her head "no" in response despite question asked. Will continue to follow.   HPI HPI: Stephanie Doyle Kwan is a 63 y.o. female who presented to the emergency department with sudden onset of severe headache, requiring intubation in the emergency department. Workup included CT scan of the brain which demonstrated diffuse subarachnoid hemorrhage, frontal and temporal bilaterally. 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 Pt underwent right craniotomy for clipping of the ACA aneurysm on 11/3. Pt underwent trach placement after failing to wean from the vent on 08/14/14. She has remained ventilatory dependent due to fungemia and possible airway edema following trach. Pt has been reported to be alert and nodding to questions. SLP to evaluate commincation and cognitive fucntion.    Pertinent Vitals Pain Assessment: Faces Faces Pain Scale: No hurt  SLP Plan  Continue with current plan of care    Recommendations                Oral Care Recommendations: Oral care Q4 per protocol Follow up Recommendations: LTACH;Skilled Nursing facility Plan: Continue with current plan of care    GO      Maxcine HamLaura Paiewonsky, M.A. CCC-SLP [phone]  Maxcine Hamaiewonsky, Danielle Mink 08/24/2014, 4:38 PM

## 2014-08-24 NOTE — Progress Notes (Signed)
PULMONARY / CRITICAL CARE MEDICINE   Name: Stephanie Doyle MRN: [id number] DOB: [birth year]    ADMISSION DATE:  07/26/2014 CONSULTATION DATE:  07/26/2014  REFERRING MD :  Dr. Franky Macho  CHIEF COMPLAINT:  Headache  INITIAL PRESENTATION: 63 year old female with no known medical history presented to [facility] 11/2 with severe headache, found to have SAH. AMS in ED requiring intubation. She was transferred to [facility] for further eval. PCCM consulted for vent and ICU management.  STUDIES:  11/2 CT head > diffuse SAH, frontal and temporal bilaterally.  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 Status post anterior communicating artery aneurysm clipping; frontal lobe edema stable, small volume intraventricular hemorrhage stable, regression of SAH, no new intracranial abnormality  SIGNIFICANT EVENTS: 11/2[facility], Intubated for airway protection, transferred to [facility] NICU 11/4- Right pterional craniotomy for clipping of anterior communicating artery aneurysm, complex 11/5 - no cuff leak >> started steroids 11/7 not weaning , lowest ps was 12 11/12 trach > 11/14 hgb 6.6, no active sign of bleeding 11/15 worsening renal failure, vasopressin and neo added for BP goals 11/17 Hb 6.7 - 1 U. Afebrile. Excellent UO. More responsive 08/11/14: ID recommending TTE and PICC line changed to left  side. Continues to be on trach and vent. Anasarca +. Low grade fever 4F + 08/12/14 _ bilateral thigh rash/redness with eedema - ID stopped vancomycin and monitoring. Low grade fever +. Edema improved with lasix. Na holding at 156 with free water correction. Fails wean   SUBJECTIVE/OVERNIGHT/INTERVAL HX No events reported overnight.  Cont to fail  weaning attempts. Vt 200 PSV 15/5  VITAL SIGNS: Temp:  [97.2 F (36.2 C)-98.7 F (37.1 C)] 98.7 F (37.1 C) (12/01 1132) Pulse Rate:  [84-104] 97 (12/01 1153) Resp:  [16-30] 30 (12/01 1153) BP: (89-127)/(39-65) 121/56 mmHg (12/01 1153) SpO2:  [100 %] 100 % (12/01 1132) FiO2 (%):  [30 %-40 %] 30 % (12/01 1153) Weight:  [112 lb 3.4 oz (50.9 kg)] 112 lb 3.4 oz (50.9 kg) (12/01 0430)     VENTILATOR SETTINGS: Vent Mode:  [-] PSV;CPAP FiO2 (%):  [30 %-40 %] 30 % Set Rate:  [16 bmp] 16 bmp Vt Set:  [370 mL] 370 mL PEEP:  [5 cmH20] 5 cmH20 Pressure Support:  [10 cmH20] 10 cmH20 Plateau Pressure:  [13 cmH20-17 cmH20] 16 cmH20  INTAKE / OUTPUT:  Intake/Output Summary (Last 24 hours) at 08/24/14 1351 Last data filed at 08/24/14 0912  Gross per 24 hour  Intake    960 ml  Output   1885 ml  Net   -925 ml    PHYSICAL EXAMINATION:  Gen: on vent, awake , on ps vent HEENT: scalp incision c/d/i, trach in place, clean PULM: decreased BS and coarse CV: s1 s2 Tachy regula Ab: BS+, soft Ext: edema/anasarca improved Derm: no breakdown but has redness bilateral anterior thigh Neuro:  RASS 0,does not follow commands, tracks  LABS: PULMONARY  Recent Labs Lab 08/19/14 0407  PHART 7.453*  PCO2ART 56.8*  PO2ART 119.0*  HCO3 39.2*  TCO2 40.9  O2SAT 98.8    CBC  Recent Labs Lab 08/20/14 0545 08/21/14 0505  HGB 7.7* 7.8*  HCT 26.1* 26.7*  WBC 10.3 14.4*  PLT 433* 474*  COAGULATION No results for input(s): INR in the last 168 hours.  CARDIAC   No results for input(s): TROPONINI in the last 168 hours. No results for input(s): PROBNP in the last 168 hours.   CHEMISTRY  Recent Labs Lab 08/18/14 1630 08/20/14 0545 08/21/14 0505 08/24/14 0247  NA 148* 148* 146 145  K 4.1 4.1 4.3 4.0  CL 101 99 98 97  CO2 37* 42* 38* 39*  GLUCOSE 116* 237* 252* 148*  BUN 23 26* 33* 49*  CREATININE 0.73 0.67 0.65 0.78  CALCIUM 9.4 9.5 9.6 9.1  MG  --   --  2.3  --   PHOS  --    --  3.6  --    Estimated Creatinine Clearance: 56.3 mL/min (by C-G formula based on Cr of 0.78).   LIVER No results for input(s): AST, ALT, ALKPHOS, BILITOT, PROT, ALBUMIN, INR in the last 168 hours.   INFECTIOUS No results for input(s): LATICACIDVEN, PROCALCITON in the last 168 hours.   ENDOCRINE CBG (last 3)   Recent Labs  08/24/14 0502 08/24/14 0744 08/24/14 1125  GLUCAP 143* 181* 242*    IMAGING x48h No results found.   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 Fentanyl gtt now off, PRN pushes at this time. Nimodipine, statin now off   PULMONARY OETT 11/2 >>>trach   A: Acute hypoxemic respiratory failure > HCAP complicated by pulmonary edema  COPD, with mucus plugging, bronchoconstriction Concern for upper airway edema s/p tracheostomy 11/12 P:   Continue attempts at vent weaning  Titrate O2 for sat of 88-92%. Per tube prednisone with rapid taper Cont  duoneb q6H  CARDIOVASCULAR Rt IJ CVL 11/03 >> A:  Septic shock, resolved HHH therapy in setting of SAH, completed Hypernatremia resolved.  Anasarca  New finding of moderate mitral regurg on echo 08/01/14  P:  Cont  free water Lasix 40 mg PO daily.  RENAL  Recent Labs Lab 08/20/14 0545 08/21/14 0505 08/24/14 0247  NA 148* 146 145    A:   Hypernatremia, improving AKI > resolved  P:   Lasix 40 mg PO daily. Continue free water Replace electrolytes as indicated Follow BMP  GASTROINTESTINAL A:   Nutrition Ileus P:   Tube feeds   SUP: pepcid  HEMATOLOGIC A:   Anemia of critical illness >  monitor Leukocytosis. P: Limit phlebotomy SCD for DVT prevention  INFECTIOUS  Blood 11/02 >>NTD Blood 11/03 >>NTD Blood 11/04 >>NTD C diff 11/06 >> neg Sputum 11/2>>neg UC 11-2>>neg ==== Blood 11/15 >>yeast  >>candida tropicalis 2/2 pos + glabrata (only speciated 11/27) Urine 11/15 >> yeast  Resp 11/15 >>NEG .............. Blood 08/10/14 >>NG  A:   Sepsis 11/04 > no clear source, treated with broad spectrum Abx for possible PNA Septic shock 11/15 due to fungemia, candida tropicalis poss line related, note late speciation glabrata 11/27 TTE NEG for vegetations, prob need TEE to r/o endocarditis P:   Pulled central line (5012 days old) 08/09/14 Changed R Picc to left Picc 08/11/14 Anbitiocs Vanc 07/29/14 >. 08/11/14 (rash) Continue Imipenem 11/15 >11.23  Continue diflucan 08/09/14 - candida tropicalis + glabrata  - Because she has had good clinical response to fluconazole, now f/u BC both negative, will continue for now. If any decline will change diflucan to mycofungin and TEE - anticipate will need  21 days of fluconazole.  ENDOCRINE A:   Hyperglycemia steroid induced P:   SSI while on tube feeds Add lantus 10units bid while on steroids  NEUROLOGY: A: 11/2- SAH, Intubated for airway protection, [facility]- Right pterional craniotomy for clipping of anterior communicating artery aneurysm, complex Mild anxiety but neuro status improving daily  P: Per neurosurgery Add anxiolytic  FAMILY  - Updates: sister updated 11/30  On PS 5, may need LTAC  [facility] ACNP [facility] PCCM Pager [phone] till 3 pm If no answer page [phone] 08/24/2014, 1:52 PM  Continue on PS, will need LTAC once cleared by insurance.  Will be a very slow wean.  Patient seen and examined, agree with above note.  I dictated the care and orders written for this patient under my direction.  Alyson ReedyWesam G Yacoub, MD [phone]

## 2014-08-24 NOTE — Plan of Care (Signed)
Problem: Phase I Progression Outcomes Goal: Respiratory status stable Outcome: Progressing     

## 2014-08-24 NOTE — Plan of Care (Signed)
Problem: Consults Goal: Diabetes Guidelines if Diabetic/Glucose > 140 If diabetic or lab glucose is > 140 mg/dl - Initiate Diabetes/Hyperglycemia Guidelines & Document Interventions  Outcome: Progressing  Problem: RH SKIN INTEGRITY Goal: RH STG SKIN FREE OF INFECTION/BREAKDOWN Outcome: Progressing

## 2014-08-25 ENCOUNTER — Inpatient Hospital Stay (HOSPITAL_COMMUNITY): Payer: 59

## 2014-08-25 LAB — BASIC METABOLIC PANEL
ANION GAP: 9 (ref 5–15)
BUN: 44 mg/dL — ABNORMAL HIGH (ref 6–23)
CO2: 38 mEq/L — ABNORMAL HIGH (ref 19–32)
Calcium: 9.7 mg/dL (ref 8.4–10.5)
Chloride: 95 mEq/L — ABNORMAL LOW (ref 96–112)
Creatinine, Ser: 0.77 mg/dL (ref 0.50–1.10)
GFR calc non Af Amer: 86 mL/min — ABNORMAL LOW (ref >90)
Glucose, Bld: 106 mg/dL — ABNORMAL HIGH (ref 70–99)
POTASSIUM: 3.8 meq/L (ref 3.7–5.3)
Sodium: 142 mEq/L (ref 137–147)

## 2014-08-25 LAB — GLUCOSE, CAPILLARY
GLUCOSE-CAPILLARY: 143 mg/dL — AB (ref 70–99)
Glucose-Capillary: 134 mg/dL — ABNORMAL HIGH (ref 70–99)
Glucose-Capillary: 79 mg/dL (ref 70–99)
Glucose-Capillary: 173 mg/dL — ABNORMAL HIGH (ref 70–99)
Glucose-Capillary: 204 mg/dL — ABNORMAL HIGH (ref 70–99)
Glucose-Capillary: 94 mg/dL (ref 70–99)

## 2014-08-25 NOTE — Progress Notes (Signed)
ANTIBIOTIC CONSULT NOTE - FOLLOW UP  Pharmacy Consult for Fluconazole Indication: Fungemia  Allergies  Allergen Reactions  . Vancomycin     Pt had red rash all over body after vanc was given, this was told to me in report but had not been added to the allergy list     Patient Measurements: Height: 5\' 3"  (160 cm) Weight: 107 lb 5.8 oz (48.7 kg) IBW/kg (Calculated) : 52.4  Vital Signs: Temp: 98.6 F (37 C) (12/02 1559) Temp Source: Axillary (12/02 1559) BP: 94/53 mmHg (12/02 1559) Pulse Rate: 95 (12/02 1559) Intake/Output from previous day: 12/01 0701 - 12/02 0700 In: 2410 [I.V.:10; NG/GT:2400] Out: 3175 [Urine:3175] Intake/Output from this shift: Total I/O In: 600 [NG/GT:600] Out: 1050 [Urine:1050]  Labs:  Recent Labs  08/24/14 0247 08/25/14 0410  CREATININE 0.78 0.77   Estimated Creatinine Clearance: 53.9 mL/min (by C-G formula based on Cr of 0.77). No results for input(s): VANCOTROUGH, VANCOPEAK, VANCORANDOM, GENTTROUGH, GENTPEAK, GENTRANDOM, TOBRATROUGH, TOBRAPEAK, TOBRARND, AMIKACINPEAK, AMIKACINTROU, AMIKACIN in the last 72 hours.   Microbiology: Recent Results (from the past 720 hour(s))  MRSA PCR Screening     Status: None   Collection Time: 07/26/14  7:58 PM  Result Value Ref Range Status   MRSA by PCR NEGATIVE NEGATIVE Final    Comment:        The GeneXpert MRSA Assay (FDA approved for NASAL specimens only), is one component of a comprehensive MRSA colonization surveillance program. It is not intended to diagnose MRSA infection nor to guide or monitor treatment for MRSA infections.   Culture, Urine     Status: None   Collection Time: 07/26/14 11:12 PM  Result Value Ref Range Status   Specimen Description URINE, CATHETERIZED  Final   Special Requests NONE  Final   Culture  Setup Time   Final    07/27/2014 10:07 Performed at [facility]    Colony Count NO GROWTH Performed at [facility]   Final   Culture NO  GROWTH Performed at [facility]   Final   Report Status 07/28/2014 FINAL  Final  Culture, blood (routine x 2)     Status: None   Collection Time: 07/26/14 11:49 PM  Result Value Ref Range Status   Specimen Description BLOOD LEFT ARM  Final   Special Requests BOTTLES DRAWN AEROBIC ONLY 5CC  Final   Culture  Setup Time   Final    07/27/2014 08:58 Performed at [facility]    Culture   Final    NO GROWTH 5 DAYS Performed at [facility]    Report Status 08/02/2014 FINAL  Final  Culture, respiratory (NON-Expectorated)     Status: None   Collection Time: 07/26/14 11:52 PM  Result Value Ref Range Status   Specimen Description TRACHEAL ASPIRATE  Final   Special Requests NONE  Final   Gram Stain   Final    RARE WBC PRESENT, PREDOMINANTLY PMN RARE SQUAMOUS EPITHELIAL CELLS PRESENT RARE GRAM POSITIVE COCCI IN PAIRS Performed at [facility]    Culture   Final    Non-Pathogenic Oropharyngeal-type Flora Isolated. Performed at [facility]    Report Status 07/29/2014 FINAL  Final  Culture, blood (routine x 2)     Status: None   Collection Time: 07/27/14 12:04 AM  Result Value Ref Range Status   Specimen Description BLOOD LEFT HAND  Final   Special Requests BOTTLES DRAWN AEROBIC ONLY 4CC  Final   Culture  Setup  Time   Final    07/27/2014 08:58 [facility]    Culture   Final    NO GROWTH 5 DAYS [facility] at [facility]    Report Status 08/02/2014 FINAL  Final  Culture, respiratory (NON-Expectorated)     Status: None   Collection Time: 07/28/14  8:49 AM  Result Value Ref Range Status   Specimen Description TRACHEAL ASPIRATE  Final   Special Requests Normal  Final   Gram Stain   Final    FEW WBC PRESENT,BOTH PMN AND MONONUCLEAR NO SQUAMOUS EPITHELIAL CELLS SEEN NO ORGANISMS SEEN [facility]    Culture   Final    Non-Pathogenic Oropharyngeal-type Flora Isolated. Performed at  [facility]    Report Status 07/30/2014 FINAL  Final  Culture, blood (routine x 2)     Status: None   Collection Time: 07/28/14  9:43 AM  Result Value Ref Range Status   Specimen Description BLOOD LEFT HAND  Final   Special Requests BOTTLES DRAWN AEROBIC ONLY 10CC  Final   Culture  Setup Time   Final    07/28/2014 15:42 [facility]    Culture   Final    NO GROWTH 5 DAYS [facility]formed at [facility]    Report Status 08/03/2014 FINAL  Final  Culture, blood (routine x 2)     Status: None   Collection Time: 07/28/14 10:00 AM  Result Value Ref Range Status   Specimen Description BLOOD LEFT HAND  Final   Special Requests BOTTLES DRAWN AEROBIC ONLY 8CC  Final   Culture  Setup Time   Final    07/28/2014 15:42 [facility]formed at [facility]    Culture   Final    NO GROWTH 5 DAYS [facility]formed at [facility]    Report Status 08/03/2014 FINAL  Final  Clostridium Difficile by PCR     Status: None   Collection Time: 07/30/14  8:06 AM  Result Value Ref Range Status   C difficile by pcr NEGATIVE NEGATIVE Final  Culture, blood (routine x 2)     Status: None   Collection Time: 08/08/14  9:40 AM  Result Value Ref Range Status   Specimen Description BLOOD LEFT ARM  Final   Special Requests BOTTLES DRAWN AEROBIC ONLY 2CC  Final   Culture  Setup Time   Final    08/08/2014 15:37 [facility]formed at [facility]    Culture   Final    CANDIDA GLABRATA Note: Gram Stain Report Called to,Read Back By and Verified With: BETHANY@10 :08AM ON 08/13/14 BY DANTS Performed at [facility]    Report Status 08/20/2014 FINAL  Final  Culture, blood (routine x 2)     Status: None   Collection Time: 08/08/14 10:20 AM  Result Value Ref Range Status   Specimen Description BLOOD LEFT HAND  Final   Special Requests BOTTLES DRAWN AEROBIC AND ANAEROBIC 5CC  Final   Culture  Setup Time   Final    08/08/2014 19:28 [facility]formed at [facility]     Culture   Final    CANDIDA TROPICALIS Note: Culture results may be compromised due to an excessive volume of blood received in culture bottles. Gram Stain Report Called to,Read Back By and Verified With: MARK FIBENGE ON 08/09/2014 AT 9:48P BY Serafina MitchellWILEJ Performed at [facility]    Report Status 08/16/2014 FINAL  Final  Culture, Urine     Status:  None   Collection Time: 08/08/14 10:28 AM  Result Value Ref Range Status   Specimen Description URINE, CATHETERIZED  Final   Special Requests NONE  Final   Culture  Setup Time   Final    08/08/2014 20:36 Performed at [facility]    Colony Count   Final    >=100,000 COLONIES/ML Performed at [facility]    Culture YEAST Performed at [facility]   Final   Report Status 08/10/2014 FINAL  Final  Culture, respiratory (NON-Expectorated)     Status: None   Collection Time: 08/08/14 11:21 AM  Result Value Ref Range Status   Specimen Description TRACHEAL ASPIRATE  Final   Special Requests Normal  Final   Gram Stain   Final    MODERATE WBC PRESENT,BOTH PMN AND MONONUCLEAR RARE SQUAMOUS EPITHELIAL CELLS PRESENT NO ORGANISMS SEEN Performed at [facility]    Culture   Final    Non-Pathogenic Oropharyngeal-type Flora Isolated. Performed at [facility]    Report Status 08/11/2014 FINAL  Final  Culture, blood (routine x 2)     Status: None   Collection Time: 08/10/14  5:00 PM  Result Value Ref Range Status   Specimen Description BLOOD LEFT ARM  Final   Special Requests BOTTLES DRAWN AEROBIC ONLY 10CC  Final   Culture  Setup Time   Final    08/11/2014 00:46 Performed at [facility]    Culture   Final    NO GROWTH 5 DAYS Performed at [facility]    Report Status 08/17/2014 FINAL  Final  Culture, blood (routine x 2)     Status: None   Collection Time: 08/10/14  5:05 PM  Result Value Ref Range Status   Specimen Description BLOOD LEFT ARM  Final   Special Requests BOTTLES  DRAWN AEROBIC ONLY 2CC  Final   Culture  Setup Time   Final    08/11/2014 00:46 Performed at [facility]    Culture   Final    NO GROWTH 5 DAYS Performed at [facility]    Report Status 08/17/2014 FINAL  Final  Clostridium Difficile by PCR     Status: None   Collection Time: 08/13/14  5:35 PM  Result Value Ref Range Status   C difficile by pcr NEGATIVE NEGATIVE Final    Anti-infectives    Start     Dose/Rate Route Frequency Ordered Stop   08/17/14 2200  fluconazole (DIFLUCAN) 40 MG/ML suspension 400 mg     400 mg Per Tube Daily at bedtime 08/17/14 0754     08/13/14 1600  imipenem-cilastatin (PRIMAXIN) 250 mg in sodium chloride 0.9 % 100 mL IVPB     250 mg200 mL/hr over 30 Minutes Intravenous Every 6 hours 08/13/14 0941 08/14/14 2140   08/10/14 2200  fluconazole (DIFLUCAN) IVPB 400 mg  Status:  Discontinued     400 mg100 mL/hr over 120 Minutes Intravenous Every 24 hours 08/09/14 2158 08/17/14 0753   08/10/14 1000  micafungin (MYCAMINE) 100 mg in sodium chloride 0.9 % 100 mL IVPB  Status:  Discontinued     100 mg100 mL/hr over 1 Hours Intravenous Daily 08/10/14 0833 08/10/14 0833   08/09/14 2200  fluconazole (DIFLUCAN) IVPB 800 mg     800 mg200 mL/hr over 120 Minutes Intravenous  Once 08/09/14 2158 08/10/14 0059   08/09/14 1800  vancomycin (VANCOCIN) IVPB 750 mg/150 ml premix  Status:  Discontinued     750 mg150  mL/hr over 60 Minutes Intravenous Every 12 hours 08/09/14 0913 08/11/14 1510   08/09/14 1000  imipenem-cilastatin (PRIMAXIN) 500 mg in sodium chloride 0.9 % 100 mL IVPB  Status:  Discontinued     500 mg200 mL/hr over 30 Minutes Intravenous Every 8 hours 08/09/14 0911 08/13/14 0941   08/08/14 1030  piperacillin-tazobactam (ZOSYN) IVPB 3.375 g  Status:  Discontinued     3.375 g12.5 mL/hr over 240 Minutes Intravenous Every 8 hours 08/08/14 0933 08/08/14 0935   08/08/14 1030  vancomycin (VANCOCIN) IVPB 1000 mg/200 mL premix     1,000 mg200 mL/hr over 60 Minutes  Intravenous Every 24 hours 08/08/14 0933 08/09/14 1052   08/08/14 1030  imipenem-cilastatin (PRIMAXIN) 250 mg in sodium chloride 0.9 % 100 mL IVPB  Status:  Discontinued     250 mg200 mL/hr over 30 Minutes Intravenous Every 6 hours 08/08/14 0938 08/09/14 0910   07/29/14 0930  vancomycin (VANCOCIN) IVPB 1000 mg/200 mL premix  Status:  Discontinued     1,000 mg200 mL/hr over 60 Minutes Intravenous Every 24 hours 07/28/14 0941 08/04/14 1026   07/28/14 0930  piperacillin-tazobactam (ZOSYN) IVPB 3.375 g  Status:  Discontinued     3.375 g12.5 mL/hr over 240 Minutes Intravenous Every 8 hours 07/28/14 0916 08/04/14 1026   07/28/14 0930  vancomycin (VANCOCIN) IVPB 1000 mg/200 mL premix     1,000 mg200 mL/hr over 60 Minutes Intravenous  Once 07/28/14 0916 07/28/14 1100   07/27/14 1704  bacitracin 50,000 Units in sodium chloride irrigation 0.9 % 500 mL irrigation  Status:  Discontinued       As needed 07/27/14 1704 07/27/14 2111   07/26/14 2300  ampicillin-sulbactam (UNASYN) 1.5 g in sodium chloride 0.9 % 50 mL IVPB  Status:  Discontinued     1.5 g100 mL/hr over 30 Minutes Intravenous Every 6 hours 07/26/14 2213 07/28/14 0422      Assessment: 63 YOF admitted on 07/26/2014 with SAH On flucon D# 17 for fungemia for minimum of 2 weeks (loaded appropriately), Vanc stopped 11/18 d/t drug rash, S/p 7d Imi for HCAP. ID on board - thinks fungemia d/t line infection. CVL pulled 11/16, PICC pulled 11/18 and new PICC on opposite arm; Unable to do line holiday; TTE done 11/18 - inadequate to r/o IE. Likely will need TEE. Repeat cultures have remained neg. Plan is for 2 wks per ID but CCM recommending 3 weeks of fluconazole therapy unless there is a decline in patient in which case would switch to micafungin.   Unasyn 11/2>>11/4 Vanc 11/4>>11/11; 11/15>>11/18 (rash, redness, edema) Zosyn 11/4>>11/11 Imipenem 11/15>> (11/21) Fluconazole 11/16 >>  Plan  Fluconazole 400mg  PO qday Per CCM, continue for 21 days  which means end date should be 12/6  Vinnie LevelBenjamin Rakia Frayne, PharmD., BCPS Clinical Pharmacist Pager [phone]

## 2014-08-25 NOTE — Progress Notes (Signed)
PULMONARY / CRITICAL CARE MEDICINE   Name: Stephanie Doyle MRN: [id number] DOB: April 24, 1951    ADMISSION DATE:  07/26/2014 CONSULTATION DATE:  07/26/2014  REFERRING MD :  Dr. Franky Machoabbell  CHIEF COMPLAINT:  Headache  INITIAL PRESENTATION: 63 year old female with no known medical history [facility]2 with severe headache, found to have SAH. AMS in ED requiring intubation. [facility] PCCM consulted for vent and ICU management.  STUDIES:  11/2 CT head > diffuse SAH, frontal and temporal bilaterally.  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 Status post anterior communicating artery aneurysm clipping; frontal lobe edema stable, small volume intraventricular hemorrhage stable, regression of SAH, no new intracranial abnormality  SIGNIFICANT EVENTS: 11/2[facility], Intubated for airway protection, [facility] 11[facility]- Right pterional craniotomy for clipping of anterior communicating artery aneurysm, complex 11/5 - no cuff leak >> started steroids 11/7 not weaning , lowest ps was 12 11/12 trach > 11/14 hgb 6.6, no active sign of bleeding 11/15 worsening renal failure, vasopressin and neo added for BP goals 11/17 Hb 6.7 - 1 U. Afebrile. Excellent UO. More responsive 08/11/14: ID recommending TTE and PICC line changed to left  side. Continues to be on trach and vent. Anasarca +. Low grade fever 10F + 08/12/14 _ bilateral thigh rash/redness with eedema - ID stopped vancomycin and monitoring. Low grade fever +. Edema improved with lasix. Na holding at 156 with free water correction. Fails wean   SUBJECTIVE/OVERNIGHT/INTERVAL HX No events reported overnight.  Cont to fail  weaning attempts. PSV 14/5  VITAL SIGNS: Temp:  [98.4 F (36.9 C)-98.7 F (37.1 C)] 98.4 F (36.9 C) (12/02 0741) Pulse Rate:  [81-108] 96 (12/02 0840) Resp:  [11-38] 29 (12/02 0840) BP: (97-133)/(43-60) 114/59 mmHg (12/02 0840) SpO2:  [100 %] 100 % (12/02 0840) FiO2 (%):  [30 %-40 %] 40 % (12/02 0840) Weight:  [48.7 kg (107 lb 5.8 oz)] 48.7 kg (107 lb 5.8 oz) (12/02 0500)    VENTILATOR SETTINGS: Vent Mode:  [-] CPAP;PSV FiO2 (%):  [30 %-40 %] 40 % Set Rate:  [16 bmp] 16 bmp Vt Set:  [370 mL] 370 mL PEEP:  [5 cmH20] 5 cmH20 Pressure Support:  [10 cmH20-14 cmH20] 14 cmH20 Plateau Pressure:  [9 cmH20-16 cmH20] 14 cmH20  INTAKE / OUTPUT:  Intake/Output Summary (Last 24 hours) at 08/25/14 1123 Last data filed at 08/25/14 [id number]  Gross per 24 hour  Intake   2410 ml  Output   3575 ml  Net  -1165 ml    PHYSICAL EXAMINATION:  Gen: on vent, awake , on ps vent HEENT: scalp incision c/d/i, trach in place, clean PULM: decreased BS and coarse CV: s1 s2 Tachy regula Ab: BS+, soft Ext: edema/anasarca improved Derm: no breakdown but has redness bilateral anterior thigh Neuro:  RASS 0,does not follow commands, tracks  LABS: PULMONARY  Recent Labs Lab 08/19/14 0407  PHART 7.453*  PCO2ART 56.8*  PO2ART 119.0*  HCO3 39.2*  TCO2 40.9  O2SAT 98.8    CBC  Recent Labs Lab 08/20/14 0545 08/21/14 0505  HGB 7.7* 7.8*  HCT 26.1* 26.7*  WBC 10.3 14.4*  PLT 433* 474*    COAGULATION No results  for input(s): INR in the last 168 hours.  CARDIAC   No results for input(s): TROPONINI in the last 168 hours. No results for input(s): PROBNP in the last 168 hours.   CHEMISTRY  Recent Labs Lab 08/18/14 1630 08/20/14 0545 08/21/14 0505 08/24/14 0247 08/25/14 0410  NA 148* 148* 146 145 142  K 4.1 4.1 4.3 4.0 3.8  CL 101 99 98 97 95*  CO2 37* 42* 38* 39* 38*  GLUCOSE 116* 237* 252* 148* 106*  BUN 23 26* 33* 49* 44*  CREATININE 0.73 0.67 0.65 0.78 0.77  CALCIUM 9.4 9.5  9.6 9.1 9.7  MG  --   --  2.3  --   --   PHOS  --   --  3.6  --   --    Estimated Creatinine Clearance: 53.9 mL/min (by C-G formula based on Cr of 0.77).   LIVER No results for input(s): AST, ALT, ALKPHOS, BILITOT, PROT, ALBUMIN, INR in the last 168 hours.   INFECTIOUS No results for input(s): LATICACIDVEN, PROCALCITON in the last 168 hours.   ENDOCRINE CBG (last 3)   Recent Labs  08/25/14 0059 08/25/14 0455 08/25/14 0739  GLUCAP 143* 134* 79    IMAGING x48h Dg Chest Port 1 View  08/25/2014   CLINICAL DATA:  Respiratory failure.  EXAM: PORTABLE CHEST - 1 VIEW  COMPARISON:  03/13/2014.  FINDINGS: Tracheostomy tube in stable position. Interim clearing of bibasilar atelectasis. Heart size normal. No pleural effusion or pneumothorax. No acute osseous abnormality. What appears be a gastrostomy tube noted projected over the stomach.  IMPRESSION: 1. Tracheostomy tube in stable position. 2. Interim clearing of bibasilar atelectasis.   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 Fentanyl gtt now off, PRN pushes at this time, if needed around the clock will consider drip. Nimodipine, statin now off.  PULMONARY OETT 11/2 >>>trach   A: Acute hypoxemic respiratory failure > HCAP complicated by pulmonary edema  COPD, with mucus plugging, bronchoconstriction Concern for upper airway edema s/p tracheostomy 11/12 P:   Continue attempts at vent weaning. Titrate O2 for sat of 88-92%. Per tube prednisone with rapid taper. Cont  duoneb q6H  CARDIOVASCULAR Rt IJ CVL 11/03 >> A:  Septic shock, resolved HHH therapy in setting of SAH, completed Hypernatremia resolved.  Anasarca  New finding of moderate mitral regurg on echo 08/01/14  P:   Cont  free water Lasix 40 mg PO daily.  RENAL  Recent Labs Lab 08/21/14 0505 08/24/14 0247 08/25/14 0410  NA 146 145 142   A:   Hypernatremia, improving AKI > resolved  P:   Lasix 40 mg PO daily. Continue free water. Replace electrolytes as indicated. Follow BMP.  GASTROINTESTINAL A:   Nutrition Ileus P:   Tube feeds   SUP: pepcid  HEMATOLOGIC A:   Anemia of critical illness >  monitor Leukocytosis. P: Limit phlebotomy SCD for DVT prevention  INFECTIOUS  Blood 11/02 >>NTD Blood 11/03 >>NTD Blood 11/04 >>NTD C diff 11/06 >> neg Sputum 11/2>>neg UC 11-2>>neg ==== Blood 11/15 >>yeast >>candida tropicalis 2/2 pos + glabrata (only speciated 11/27) Urine 11/15 >> yeast  Resp 11/15 >>NEG .............. Blood 08/10/14 >>NG  A:   Sepsis 11/04 > no  clear source, treated with broad spectrum Abx for possible PNA Septic shock 11/15 due to fungemia, candida tropicalis poss line related, note late speciation glabrata 11/27 TTE NEG for vegetations, prob need TEE to r/o endocarditis P:   Pulled central line (7512 days old) 08/09/14 Changed R Picc to left Picc 08/11/14 Anbitiocs Vanc 07/29/14 >. 08/11/14 (rash) Continue Imipenem 11/15 >11.23  Continue diflucan 08/09/14 - candida tropicalis + glabrata  - Because she has had good clinical response to fluconazole, now f/u BC both negative, will continue for now. If any decline will change diflucan to mycofungin and TEE - anticipate will need 21 days of fluconazole.  ENDOCRINE A:   Hyperglycemia steroid induced P:   SSI while on tube feeds Add lantus 10units bid while on steroids  NEUROLOGY: A: 11/2- SAH, Intubated for airway protection, [facility]- Right pterional craniotomy for clipping of anterior communicating artery aneurysm, complex Mild anxiety but neuro status improving daily  P: Per neurosurgery Add anxiolytic  FAMILY  - Updates: sister updated 11/30  Failing weaning, will need  vent SNF.  Alyson ReedyWesam G. Eshika Reckart, M.D. [facility]. Pager: [phone]. After hours pager: [phone].

## 2014-08-25 NOTE — Progress Notes (Signed)
NUTRITION FOLLOW UP  INTERVENTION: Continue current TF regimen RD to follow for nutrition care plan  NUTRITION DIAGNOSIS: Inadequate oral intake related to inability to eat as evidenced by NPO status, ongoing  Goal: Pt to meet >/= 90% of their estimated nutrition needs, met    Monitor:  TF regimen & tolerance, respiratory status, weight, labs, I/O's  ASSESSMENT: 63 year old Female with no known medical history presented to [facility] 11/2 with severe headache, found to have SAH. AMS in ED requiring intubation. She was transferred to [facility] for further evaluation.  Patient s/p procedures 11/12: PERCUTANEOUS TRACHEOSTOMY PEG TUBE PLACEMENT  Transferred from 45mNeuro ICU to 2C-Stepdown 11/23.  Patient is currently on ventilator support MV: 5.8 L/min Temp (24hrs), Avg:98.5 F (36.9 C), Min:98.4 F (36.9 C), Max:98.6 F (37 C)   Pt continues to fail weaning.  Vital AF 1.2 formula infusing at 40 ml/hr via PEG tube providing 1252 kcals, 87 grams of protein, and 778 ml of free water.  Also receiving liquid MVI daily.  Free water flushes at 300 ml every 4 hours.  Disposition: vent SNF.  Height: Ht Readings from Last 1 Encounters:  08/16/14 5' 3"  (1.6 m)    Weight: -----> fluctuating  Wt Readings from Last 1 Encounters:  08/25/14 107 lb 5.8 oz (48.7 kg)    12/01  112 lb 11/30  114 lb 11/29  114 lb 11/28  120 lb 11/27  126 lb 11/26  128 lb 11/25  137 lb 11/24  143 lb 11/23  141 lb 11/22  141 lb 11/21  150 lb 11/20  148 lb 11/19  150 lb 11/18  162 lb  BMI:  Body mass index is 19.02 kg/(m^2).  Re-estimated Nutritional Needs: Kcal: 1250-1400 Protein: 75-85 gm Fluid: >/= 1.5 L/day  Skin: surgical head incision  Diet Order: NPO   Intake/Output Summary (Last 24 hours) at 08/25/14 1205 Last data filed at 08/25/14 1144  Gross per 24 hour  Intake   2410 ml  Output   3975 ml  Net  -1565 ml    Labs:   Recent Labs Lab 08/21/14 0505 08/24/14 0247 08/25/14 0410   NA 146 145 142  K 4.3 4.0 3.8  CL 98 97 95*  CO2 38* 39* 38*  BUN 33* 49* 44*  CREATININE 0.65 0.78 0.77  CALCIUM 9.6 9.1 9.7  MG 2.3  --   --   PHOS 3.6  --   --   GLUCOSE 252* 148* 106*    CBG (last 3)   Recent Labs  08/25/14 0059 08/25/14 0455 08/25/14 0739  GLUCAP 143* 134* 79    Scheduled Meds: . ALPRAZolam  0.25 mg Per Tube 3 times per day  . antiseptic oral rinse  7 mL Mouth Rinse QID  . chlorhexidine  15 mL Mouth Rinse BID  . famotidine  20 mg Per Tube Q12H  . feeding supplement (PRO-STAT SUGAR FREE 64)  30 mL Per Tube Daily  . fentaNYL  50 mcg Transdermal Q72H  . fluconazole  400 mg Per Tube QHS  . free water  300 mL Per Tube Q4H  . furosemide  40 mg Per Tube Daily  . insulin aspart  0-20 Units Subcutaneous 6 times per day  . insulin glargine  10 Units Subcutaneous BID  . ipratropium-albuterol  3 mL Nebulization Q6H  . levETIRAcetam  500 mg Per Tube BID  . multivitamin  5 mL Per Tube Daily  . predniSONE  20 mg Per Tube Q breakfast  .  sodium chloride  10-40 mL Intracatheter Q12H    Continuous Infusions: . feeding supplement (VITAL AF 1.2 CAL) 1,000 mL (08/25/14 0950)    Arthur Holms, RD, LDN Pager #: [phone] After-Hours Pager #: [phone]

## 2014-08-25 NOTE — Plan of Care (Signed)
Problem: Phase I Progression Outcomes Goal: Baseline oxygen/pH stable Outcome: Progressing     

## 2014-08-25 NOTE — Clinical Social Work Placement (Signed)
  [facility], [place], [place] [phone] Faxed [address]  [facility], [place], [place] -[place]- [phone] Faxed [address]  [facility], [place], [place]- [phone] Faxed 12/2  CSW will continue to follow.  Merlyn LotJenna Holoman, LCSWA Clinical Social Worker [phone]

## 2014-08-25 NOTE — Clinical Social Work Note (Signed)
Clinical Social Work Department BRIEF PSYCHOSOCIAL ASSESSMENT 08/25/2014  Patient:  Stephanie Doyle,Stephanie Doyle     Account Number:  [id number]     Admit date:  07/26/2014  Clinical Social Worker:  Merlyn LotHOLOMAN,Vaneza Pickart, CLINICAL SOCIAL WORKER  Date/Time:  08/25/2014 02:43 PM  Referred by:  Physician  Date Referred:  08/24/2014 Referred for  SNF Placement   Other Referral:   Interview type:  Family Other interview type:    PSYCHOSOCIAL DATA Living Status:  ALONE Admitted from facility:   Level of care:   Primary support name:  Stephanie Doyle Primary support relationship to patient:  SIBLING Degree of support available:   Patients sister is very involved in care and is considering taking the patient home on a ventilator    CURRENT CONCERNS Current Concerns  Post-Acute Placement   Other Concerns:    SOCIAL WORK ASSESSMENT / PLAN CSW spoke with patients sister concerning Vent SNF- patients sister is hopeful that LTACH will be an option but is agreeable to CSW looking in Vent SNF options.   Assessment/plan status:  Psychosocial Support/Ongoing Assessment of Needs Other assessment/ plan:   FL2  PASAR   Information/referral to community resources:    PATIENT'S/FAMILY'S RESPONSE TO PLAN OF CARE: Patients sister is agreeable to looking into Vent SNF but is also interested in potentially taking the patient home.       Merlyn LotJenna Holoman, LCSWA Clinical Social Worker [phone]

## 2014-08-26 ENCOUNTER — Other Ambulatory Visit (HOSPITAL_COMMUNITY): Payer: Self-pay

## 2014-08-26 ENCOUNTER — Inpatient Hospital Stay
Admission: AD | Admit: 2014-08-26 | Discharge: 2014-09-23 | Disposition: A | Discharge status: Skilled Nursing Facility | Payer: Self-pay | Source: Ambulatory Visit | Attending: Internal Medicine | Admitting: Internal Medicine

## 2014-08-26 DIAGNOSIS — I609 Nontraumatic subarachnoid hemorrhage, unspecified: Secondary | ICD-10-CM

## 2014-08-26 DIAGNOSIS — Z13810 Encounter for screening for upper gastrointestinal disorder: Secondary | ICD-10-CM

## 2014-08-26 DIAGNOSIS — Z09 Encounter for follow-up examination after completed treatment for conditions other than malignant neoplasm: Secondary | ICD-10-CM

## 2014-08-26 DIAGNOSIS — J9600 Acute respiratory failure, unspecified whether with hypoxia or hypercapnia: Secondary | ICD-10-CM | POA: Diagnosis present

## 2014-08-26 DIAGNOSIS — J9690 Respiratory failure, unspecified, unspecified whether with hypoxia or hypercapnia: Secondary | ICD-10-CM

## 2014-08-26 DIAGNOSIS — B49 Unspecified mycosis: Secondary | ICD-10-CM | POA: Diagnosis present

## 2014-08-26 DIAGNOSIS — J9620 Acute and chronic respiratory failure, unspecified whether with hypoxia or hypercapnia: Secondary | ICD-10-CM | POA: Diagnosis present

## 2014-08-26 DIAGNOSIS — Z930 Tracheostomy status: Secondary | ICD-10-CM | POA: Insufficient documentation

## 2014-08-26 DIAGNOSIS — E870 Hyperosmolality and hypernatremia: Secondary | ICD-10-CM | POA: Diagnosis present

## 2014-08-26 DIAGNOSIS — J449 Chronic obstructive pulmonary disease, unspecified: Secondary | ICD-10-CM | POA: Diagnosis present

## 2014-08-26 DIAGNOSIS — J189 Pneumonia, unspecified organism: Secondary | ICD-10-CM | POA: Diagnosis present

## 2014-08-26 LAB — BLOOD GAS, ARTERIAL
ACID-BASE EXCESS: 12.6 mmol/L — AB (ref 0.0–2.0)
Bicarbonate: 36.8 mEq/L — ABNORMAL HIGH (ref 20.0–24.0)
FIO2: 0.4 %
O2 Saturation: 99.4 %
PCO2 ART: 48.2 mmHg — AB (ref 35.0–45.0)
PEEP: 5 cmH2O
PH ART: 7.495 — AB (ref 7.350–7.450)
Patient temperature: 98.6
RATE: 16 resp/min
TCO2: 38.2 mmol/L (ref 0–100)
VT: 400 mL
pO2, Arterial: 199 mmHg — ABNORMAL HIGH (ref 80.0–100.0)

## 2014-08-26 LAB — GLUCOSE, CAPILLARY
GLUCOSE-CAPILLARY: 107 mg/dL — AB (ref 70–99)
GLUCOSE-CAPILLARY: 124 mg/dL — AB (ref 70–99)
GLUCOSE-CAPILLARY: 195 mg/dL — AB (ref 70–99)
Glucose-Capillary: 104 mg/dL — ABNORMAL HIGH (ref 70–99)
Glucose-Capillary: 111 mg/dL — ABNORMAL HIGH (ref 70–99)

## 2014-08-26 MED ORDER — PREDNISONE 10 MG PO TABS
10.0000 mg | ORAL_TABLET | Freq: Every day | ORAL | Status: DC
  Filled 2014-08-26: qty 1

## 2014-08-26 NOTE — Progress Notes (Signed)
Attempted Report to RT. Secretary to have RT to call back.

## 2014-08-26 NOTE — Trach Care Team (Signed)
Trach Care Progression Note   Patient Details Name: Stephanie Doyle MRN: [id number] DOB: 1951/05/21 Today's Date: 08/26/2014   Tracheostomy Assessment    Tracheostomy Shiley 6 mm Cuffed (Active)  Status Secured 08/26/2014 12:15 PM  Site Assessment Clean;Dry 08/26/2014 12:15 PM  Site Care Protective barrier to skin 08/26/2014 12:15 PM  Inner Cannula Care Changed/new 08/26/2014  6:25 AM  Ties Assessment Clean;Dry;Secure 08/26/2014 12:15 PM  Cuff pressure (cm) 25 cm 08/26/2014  3:03 AM  Trach Changed Yes 08/26/2014  6:25 AM  Emergency Equipment at bedside Yes 08/26/2014 12:15 PM     Care Needs     Respiratory Therapy O2 Device: Ventilator FiO2 (%): 40 % SpO2: 100 %    Speech Language Pathology  SLP chart review complete: Patient currently receiving at SLP services (for cognition. Not ready for PMSV - vent) Follow up Recommendations: LTACH, Skilled Nursing facility   Physical Therapy      Occupational Therapy      Nutritional Patient's Current Diet: NPO, Tube feeding Tube Feeding: Vital AF 1.2 Cal Tube Feeding Frequency: Continuous Tube Feeding Strength: Full strength    Case Management/Social Work  For discharge today Production managerelect    Provider Trach Care Team/Provider                            Recommendations Trach Care Team Members Present-  Cherylin MylarLauren Doyle, RT, Shon BatonJenna Holloman, SW, Joaquin CourtsKimberly Harris, RD    Discharge pending.           Xhaiden Coombs, Silva BandyDebra Anita (scribe for team) 08/26/2014, 2:18 PM

## 2014-08-26 NOTE — Progress Notes (Signed)
PULMONARY / CRITICAL CARE MEDICINE   Name: Stephanie Doyle MRN: [id number] DOB: 10/08/1950    ADMISSION DATE:  07/26/2014 CONSULTATION DATE:  07/26/2014  REFERRING MD :  Dr. Franky Machoabbell  CHIEF COMPLAINT:  Headache  INITIAL PRESENTATION: 63 year old female with no known medical history [facility] with severe headache, found to have SAH. AMS in ED requiring intubation. [facility]. PCCM consulted for vent and ICU management.  STUDIES:  11/2 CT head > diffuse SAH, frontal and temporal bilaterally.  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 Status post anterior communicating artery aneurysm clipping; frontal lobe edema stable, small volume intraventricular hemorrhage stable, regression of SAH, no new intracranial abnormality  SIGNIFICANT EVENTS: 11/[facility], Intubated for airway protection, [facility]- Right pterional craniotomy for clipping of anterior communicating artery aneurysm, complex 11/5 - no cuff leak >> started steroids 11/7 not weaning , lowest ps was 12 11/12 trach > 11/14 hgb 6.6, no active sign of bleeding 11/15 worsening renal failure, vasopressin and neo added for BP goals 11/17 Hb 6.7 - 1 U. Afebrile. Excellent UO. More responsive 08/11/14: ID recommending TTE and PICC line changed to left  side. Continues to be on trach and vent. Anasarca +. Low grade fever 7F + 08/12/14 _ bilateral thigh rash/redness with eedema - ID stopped vancomycin and monitoring. Low grade fever +. Edema improved with lasix. Na holding at 156 with free water correction. Fails wean   SUBJECTIVE/OVERNIGHT/INTERVAL HX No events reported overnight.  Cont to fail  weaning attempts. PSV 14/5  VITAL SIGNS: Temp:  [98.4 F (36.9 C)-99 F (37.2 C)] 98.6 F (37 C) (12/03 0800) Pulse Rate:  [95-109] 105 (12/03 0800) Resp:  [16-29] 29 (12/03 0800) BP: (94-136)/(50-61) 125/50 mmHg (12/03 0800) SpO2:  [100 %] 100 % (12/03 0800) FiO2 (%):  [40 %] 40 % (12/03 0800) Weight:  [48.2 kg (106 lb 4.2 oz)] 48.2 kg (106 lb 4.2 oz) (12/03 0400)    VENTILATOR SETTINGS: Vent Mode:  [-] PSV;CPAP FiO2 (%):  [40 %] 40 % Set Rate:  [16 bmp] 16 bmp Vt Set:  [370 mL] 370 mL PEEP:  [5 cmH20] 5 cmH20 Pressure Support:  [12 cmH20-14 cmH20] 12 cmH20 Plateau Pressure:  [15 cmH20] 15 cmH20  INTAKE / OUTPUT:  Intake/Output Summary (Last 24 hours) at 08/26/14 1045 Last data filed at 08/26/14 1029  Gross per 24 hour  Intake   1730 ml  Output   3550 ml  Net  -1820 ml    PHYSICAL EXAMINATION:  Gen: on vent, awake , on ps vent HEENT: scalp incision c/d/i, trach in place, clean PULM: decreased BS and coarse CV: s1 s2 Tachy regula Ab: BS+, soft Ext: edema/anasarca improved Derm: no breakdown but has redness bilateral anterior thigh Neuro:  RASS 0,does not follow commands, tracks  LABS: PULMONARY No results for input(s): PHART, PCO2ART, PO2ART, HCO3, TCO2, O2SAT in the last 168 hours.  Invalid input(s): PCO2, PO2  CBC  Recent Labs Lab 08/20/14 0545 08/21/14 0505  HGB 7.7* 7.8*  HCT 26.1* 26.7*  WBC 10.3 14.4*  PLT 433* 474*    COAGULATION No results for input(s): INR in the last 168 hours.  CARDIAC   No results for input(s): TROPONINI in the last 168 hours. No results for input(s): PROBNP in the last 168 hours.   CHEMISTRY  Recent Labs Lab 08/20/14 0545 08/21/14 0505 08/24/14 0247 08/25/14 0410  NA 148* 146 145 142  K 4.1 4.3 4.0 3.8  CL 99 98 97 95*  CO2 42* 38* 39* 38*  GLUCOSE 237* 252* 148* 106*  BUN 26* 33* 49* 44*  CREATININE 0.67 0.65 0.78 0.77  CALCIUM 9.5 9.6 9.1 9.7  MG  --  2.3  --   --   PHOS  --  3.6  --   --     Estimated Creatinine Clearance: 53.3 mL/min (by C-G formula based on Cr of 0.77).   LIVER No results for input(s): AST, ALT, ALKPHOS, BILITOT, PROT, ALBUMIN, INR in the last 168 hours.   INFECTIOUS No results for input(s): LATICACIDVEN, PROCALCITON in the last 168 hours.   ENDOCRINE CBG (last 3)   Recent Labs  08/26/14 0040 08/26/14 0434 08/26/14 0758  GLUCAP 111* 107* 124*    IMAGING x48h Dg Chest Port 1 View  08/25/2014   CLINICAL DATA:  Respiratory failure.  EXAM: PORTABLE CHEST - 1 VIEW  COMPARISON:  03/13/2014.  FINDINGS: Tracheostomy tube in stable position. Interim clearing of bibasilar atelectasis. Heart size normal. No pleural effusion or pneumothorax. No acute osseous abnormality. What appears be a gastrostomy tube noted projected over the stomach.  IMPRESSION: 1. Tracheostomy tube in stable position. 2. Interim clearing of bibasilar atelectasis.   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 Fentanyl gtt now off, PRN pushes at this time, if needed around the clock will consider patch. Nimodipine, statin now off.  PULMONARY OETT 11/2 >>>trach   A: Acute hypoxemic respiratory failure > HCAP complicated by pulmonary edema  COPD, with mucus plugging, bronchoconstriction Concern for upper airway edema s/p tracheostomy 11/12 P:   Continue attempts at vent weaning. Titrate O2 for sat of 88-92%. Per tube prednisone with rapid taper 12/3 decreased to 10 mg daily. Cont  duoneb q6H  CARDIOVASCULAR Rt IJ CVL 11/03 >> A:  Septic shock, resolved HHH therapy in setting of SAH, completed Hypernatremia resolved.  Anasarca  New finding of moderate mitral regurg on echo 08/01/14  P:  Cont  free water Lasix 40 mg PO  daily.  RENAL  Recent Labs Lab 08/21/14 0505 08/24/14 0247 08/25/14 0410  NA 146 145 142   A:   Hypernatremia, improving AKI > resolved  P:   Lasix 40 mg PO daily. Continue free water. Replace electrolytes as indicated. Follow BMP.  GASTROINTESTINAL A:   Nutrition Ileus P:   Tube feeds   SUP: pepcid  HEMATOLOGIC A:   Anemia of critical illness >  monitor Leukocytosis. P: Limit phlebotomy SCD for DVT prevention  INFECTIOUS  Blood 11/02 >>NTD Blood 11/03 >>NTD Blood 11/04 >>NTD C diff 11/06 >> neg Sputum 11/2>>neg UC 11-2>>neg ==== Blood 11/15 >>yeast >>candida tropicalis 2/2 pos + glabrata (only speciated 11/27) Urine 11/15 >> yeast  Resp 11/15 >>NEG .............. Blood 08/10/14 >>NG  A:   Sepsis 11/04 > no clear source, treated with broad spectrum Abx for possible PNA Septic shock 11/15 due to fungemia, candida tropicalis poss  line related, note late speciation glabrata 11/27 TTE NEG for vegetations, prob need TEE to r/o endocarditis P:   Pulled central line (35 days old) 08/09/14 Changed R Picc to left Picc 08/11/14 Anbitiocs Vanc 07/29/14 >. 08/11/14 (rash) Continue Imipenem 11/15 >11.23  Continue diflucan 08/09/14 - candida tropicalis + glabrata  - Because she has had good clinical response to fluconazole, now f/u BC both negative, will continue for now. If any decline will change diflucan to mycofungin and TEE. - Plan 21 days of fluconazole.  ENDOCRINE A:   Hyperglycemia steroid induced P:   SSI while on tube feeds Add lantus 10units bid while on steroids  NEUROLOGY: A: 11/2- SAH, Intubated for airway protection, transferred to [facility] NICU 11/4- Right pterional craniotomy for clipping of anterior communicating artery aneurysm, complex Mild anxiety but neuro status improving daily  P: Per neurosurgery Add anxiolytic  FAMILY  - Updates: No family bedsdie.  Transfer to [facility] today.  Alyson Reedy, M.D. [facility] Pulmonary/Critical  Care Medicine. Pager: [phone]. After hours pager: [phone].

## 2014-08-26 NOTE — Clinical Social Work Note (Signed)
CSW [facility]- potentially bed available today/tomorrow.  CSW then informed during rounds that [facility].  CSW signing off.  Merlyn LotJenna Holoman, LCSWA Clinical Social Worker [phone]

## 2014-08-26 NOTE — Progress Notes (Signed)
[facility]

## 2014-08-27 LAB — PROTIME-INR
INR: 1.12 (ref 0.00–1.49)
Prothrombin Time: 14.6 seconds (ref 11.6–15.2)

## 2014-08-27 LAB — COMPREHENSIVE METABOLIC PANEL
ALT: 28 U/L (ref 0–35)
AST: 21 U/L (ref 0–37)
Albumin: 3.4 g/dL — ABNORMAL LOW (ref 3.5–5.2)
Alkaline Phosphatase: 64 U/L (ref 39–117)
Anion gap: 13 (ref 5–15)
BUN: 55 mg/dL — ABNORMAL HIGH (ref 6–23)
CALCIUM: 10 mg/dL (ref 8.4–10.5)
CO2: 34 meq/L — AB (ref 19–32)
Chloride: 93 mEq/L — ABNORMAL LOW (ref 96–112)
Creatinine, Ser: 0.92 mg/dL (ref 0.50–1.10)
GFR, EST AFRICAN AMERICAN: 74 mL/min — AB (ref >90)
GFR, EST NON AFRICAN AMERICAN: 64 mL/min — AB (ref >90)
GLUCOSE: 106 mg/dL — AB (ref 70–99)
Potassium: 4.4 mEq/L (ref 3.7–5.3)
Sodium: 140 mEq/L (ref 137–147)
Total Bilirubin: 0.4 mg/dL (ref 0.3–1.2)
Total Protein: 7.2 g/dL (ref 6.0–8.3)

## 2014-08-27 LAB — CBC
HCT: 33.1 % — ABNORMAL LOW (ref 36.0–46.0)
Hemoglobin: 9.9 g/dL — ABNORMAL LOW (ref 12.0–15.0)
MCH: 26.3 pg (ref 26.0–34.0)
MCHC: 29.9 g/dL — ABNORMAL LOW (ref 30.0–36.0)
MCV: 87.8 fL (ref 78.0–100.0)
PLATELETS: 415 10*3/uL — AB (ref 150–400)
RBC: 3.77 MIL/uL — AB (ref 3.87–5.11)
RDW: 15.1 % (ref 11.5–15.5)
WBC: 13.7 10*3/uL — AB (ref 4.0–10.5)

## 2014-08-27 LAB — LACTIC ACID, PLASMA: Lactic Acid, Venous: 0.8 mmol/L (ref 0.5–2.2)

## 2014-08-27 LAB — APTT: APTT: 25 s (ref 24–37)

## 2014-08-27 LAB — TSH: TSH: 3.31 u[IU]/mL (ref 0.350–4.500)

## 2014-08-27 LAB — MAGNESIUM: Magnesium: 2.6 mg/dL — ABNORMAL HIGH (ref 1.5–2.5)

## 2014-08-27 LAB — PROCALCITONIN: Procalcitonin: 0.14 ng/mL

## 2014-08-30 ENCOUNTER — Other Ambulatory Visit (HOSPITAL_COMMUNITY): Payer: Self-pay

## 2014-08-30 DIAGNOSIS — Z930 Tracheostomy status: Secondary | ICD-10-CM

## 2014-08-30 DIAGNOSIS — J189 Pneumonia, unspecified organism: Secondary | ICD-10-CM

## 2014-08-30 DIAGNOSIS — J9620 Acute and chronic respiratory failure, unspecified whether with hypoxia or hypercapnia: Secondary | ICD-10-CM

## 2014-08-30 LAB — BASIC METABOLIC PANEL
ANION GAP: 15 (ref 5–15)
BUN: 84 mg/dL — ABNORMAL HIGH (ref 6–23)
CHLORIDE: 96 meq/L (ref 96–112)
CO2: 32 mEq/L (ref 19–32)
CREATININE: 1.61 mg/dL — AB (ref 0.50–1.10)
Calcium: 9.6 mg/dL (ref 8.4–10.5)
GFR, EST AFRICAN AMERICAN: 38 mL/min — AB (ref >90)
GFR, EST NON AFRICAN AMERICAN: 33 mL/min — AB (ref >90)
Glucose, Bld: 167 mg/dL — ABNORMAL HIGH (ref 70–99)
Potassium: 4.4 mEq/L (ref 3.7–5.3)
Sodium: 143 mEq/L (ref 137–147)

## 2014-08-30 LAB — CBC
HCT: 32.7 % — ABNORMAL LOW (ref 36.0–46.0)
Hemoglobin: 9.7 g/dL — ABNORMAL LOW (ref 12.0–15.0)
MCH: 26.7 pg (ref 26.0–34.0)
MCHC: 29.7 g/dL — AB (ref 30.0–36.0)
MCV: 90.1 fL (ref 78.0–100.0)
PLATELETS: 339 10*3/uL (ref 150–400)
RBC: 3.63 MIL/uL — ABNORMAL LOW (ref 3.87–5.11)
RDW: 15.2 % (ref 11.5–15.5)
WBC: 11.2 10*3/uL — AB (ref 4.0–10.5)

## 2014-08-30 NOTE — Consult Note (Signed)
Name: Stephanie Doyle MRN: [id number] DOB: [birth year]    ADMISSION DATE:  08/26/2014 CONSULTATION DATE:  12/7  [facility]  CHIEF COMPLAINT:  VDRF  BRIEF PATIENT DESCRIPTION:  63 year old female with no known medical history [facility] 11/2 with severe headache, found to have SAH. AMS in ED requiring intubation. [facility]. PCCM consulted for vent and ICU management. [facility] and PCCM asked to manage vent.  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 Afebrile. Excellent UO. More responsive 08/11/14: ID recommending TTE and PICC line changed to left side. Continues to be on trach and vent. Anasarca +. Low grade fever 39F + 08/12/14 _ bilateral thigh rash/redness with eedema - ID stopped vancomycin and monitoring. Low grade fever +. Edema improved with lasix. Na holding at 156 with free water correction. Fails wean 12/4 tx to ssh  STUDIES:  11/2 CT head > diffuse SAH, frontal and temporal bilaterally.  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 Status post anterior communicating artery aneurysm clipping; frontal lobe edema stable, small volume intraventricular hemorrhage stable, regression of SAH, no new intracranial abnormality   HISTORY OF PRESENT ILLNESS:   Stephanie SilvasVicky D Doyle is a 63 y.o.  female with hx of COPD presented to OSH on 11/2 with severe headache, found to have SAH. AMS in ED requiring intubation. She had CTA head showing 3x3 mm aneurysm from ACA; no aneurysm in intracranial circulation; stable SAH. 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 She had tracheostomy placed on 11/12. She was initially placed on 8 days of vancomycin and piptazo and off of antibiotics for roughly 3 days when leukocytosis started to markedly increase to 25-28K, possible hypoxia, thus she was started on vanco and imipenem for possible hcap. She was pan cultured. CVL was pulled on 11/15. She does have picc line in place since 11/09. On evening of 11/16, Blood cx grew yeast, thus fluconazole started on 11/16. Leukocytosis improved. [facility] with goal of vent liberation.  PAST MEDICAL HISTORY :   has a past medical history of COPD (chronic obstructive pulmonary disease).  has past surgical history that includes Craniotomy (Right, 07/27/2014); Esophagogastroduodenoscopy (egd) with propofol (N/A, 08/05/2014); and PEG placement (N/A, 08/05/2014). Prior to Admission medications   Medication Sig Start Date End Date Taking? Authorizing Provider  benzonatate (TESSALON) 100 MG capsule Take 100 mg by mouth 3 (three) times daily as needed for cough.  07/14/14   Historical Provider, MD  fluticasone (FLONASE) 50 MCG/ACT nasal spray Place 2 sprays into both nostrils daily.  05/31/14   Historical Provider, MD  SPIRIVA HANDIHALER 18 MCG inhalation capsule Place 18 mcg into inhaler and inhale daily.  06/02/14   Historical Provider, MD  SYMBICORT 160-4.5 MCG/ACT inhaler Inhale 2 puffs into  the lungs 2 (two) times daily.  05/17/14   Historical Provider, MD   Allergies  Allergen Reactions  . Vancomycin     Pt had red rash all over body after vanc was given, this was told to me in report but had not been added to the allergy list     FAMILY  HISTORY:  family history is not on file. SOCIAL HISTORY:    REVIEW OF SYSTEMS:   10 point review of system taken, please see HPI for positives and negatives.  SUBJECTIVE:   VITAL SIGNS: Vital signs reviewed. Abnormal values will appear under impression plan section.   PHYSICAL EXAMINATION: Gen: on vent, awake , on ps vent but with low Vt HEENT: scalp incision c/d/i, trach in place, clean PULM: decreased BS and coarse CV: s1 s2 Tachy regula Ab: BS+, soft Ext: edema/anasarca improved Derm: no breakdown but has redness bilateral anterior thigh Neuro: RASS 0,does not follow commands, tracks, restraints noted  Recent Labs Lab 08/25/14 0410 08/27/14 0614 08/30/14 0451  NA 142 140 143  K 3.8 4.4 4.4  CL 95* 93* 96  CO2 38* 34* 32  BUN 44* 55* 84*  CREATININE 0.77 0.92 1.61*  GLUCOSE 106* 106* 167*    Recent Labs Lab 08/27/14 0614 08/30/14 0451  HGB 9.9* 9.7*  HCT 33.1* 32.7*  WBC 13.7* 11.2*  PLT 415* 339   Dg Chest Port 1 View  08/30/2014   CLINICAL DATA:  Tracheostomy tube.  EXAM: PORTABLE CHEST - 1 VIEW  COMPARISON:  08/25/2014.  FINDINGS: Tracheostomy tube in good anatomic position. Lungs are clear. Heart size normal. No pleural effusion or pneumothorax. No focal bony abnormality.  IMPRESSION: 1. Tracheostomy tube in good anatomic position. 2. No acute pulmonary disease.   Electronically Signed   By: Maisie Fushomas  Register   On: 08/30/2014 07:54    ASSESSMENT / PLAN: Active Problems:   Acute respiratory failure   Subarachnoid hemorrhage   Acute on chronic respiratory failure   Fungemia   HCAP (healthcare-associated pneumonia)   Hypernatremia(resolved)   COPD, moderate Discussion:  PCCM asked to assist with vent management.     PULMONARY OETT 11/2 >>>trach  A: Acute hypoxemic respiratory failure > HCAP complicated by pulmonary edema  COPD, with mucus plugging, bronchoconstriction Concern for upper airway edema s/p tracheostomy 11/12 P:  Continue  attempts at vent weaning. Titrate O2 for sat of 88-92%. Per tube prednisone with rapid taper 12/3 decreased to 10 mg daily. Cont duoneb q6H All other issues [facility]  Steve Minor ACNP [facility] Pager [phone] till 3 pm If no answer page [phone] 08/30/2014, 9:58 AM  Failed weaning today, will reattempt for 2 hours with PS of 12/5, taper prednisone as above.  Will continue to follow.  Patient seen and examined, agree with above note.  I dictated the care and orders written for this patient under my direction.  Alyson ReedyWesam G Yacoub, MD [phone]

## 2014-08-31 LAB — BASIC METABOLIC PANEL
ANION GAP: 10 (ref 5–15)
BUN: 76 mg/dL — ABNORMAL HIGH (ref 6–23)
CALCIUM: 9.6 mg/dL (ref 8.4–10.5)
CO2: 33 mEq/L — ABNORMAL HIGH (ref 19–32)
CREATININE: 1.28 mg/dL — AB (ref 0.50–1.10)
Chloride: 103 mEq/L (ref 96–112)
GFR, EST AFRICAN AMERICAN: 50 mL/min — AB (ref >90)
GFR, EST NON AFRICAN AMERICAN: 43 mL/min — AB (ref >90)
Glucose, Bld: 131 mg/dL — ABNORMAL HIGH (ref 70–99)
Potassium: 4.4 mEq/L (ref 3.7–5.3)
Sodium: 146 mEq/L (ref 137–147)

## 2014-09-02 ENCOUNTER — Other Ambulatory Visit (HOSPITAL_COMMUNITY): Payer: Self-pay

## 2014-09-02 DIAGNOSIS — J449 Chronic obstructive pulmonary disease, unspecified: Secondary | ICD-10-CM

## 2014-09-02 DIAGNOSIS — B49 Unspecified mycosis: Secondary | ICD-10-CM

## 2014-09-02 LAB — CBC
HCT: 37.8 % (ref 36.0–46.0)
HEMOGLOBIN: 11.1 g/dL — AB (ref 12.0–15.0)
MCH: 26.9 pg (ref 26.0–34.0)
MCHC: 29.4 g/dL — AB (ref 30.0–36.0)
MCV: 91.5 fL (ref 78.0–100.0)
PLATELETS: 306 10*3/uL (ref 150–400)
RBC: 4.13 MIL/uL (ref 3.87–5.11)
RDW: 15 % (ref 11.5–15.5)
WBC: 15.8 10*3/uL — ABNORMAL HIGH (ref 4.0–10.5)

## 2014-09-02 LAB — COMPREHENSIVE METABOLIC PANEL WITH GFR
ALT: 29 U/L (ref 0–35)
AST: 25 U/L (ref 0–37)
Albumin: 3.2 g/dL — ABNORMAL LOW (ref 3.5–5.2)
Alkaline Phosphatase: 81 U/L (ref 39–117)
Anion gap: 13 (ref 5–15)
BUN: 44 mg/dL — ABNORMAL HIGH (ref 6–23)
CO2: 29 meq/L (ref 19–32)
Calcium: 10.8 mg/dL — ABNORMAL HIGH (ref 8.4–10.5)
Chloride: 104 meq/L (ref 96–112)
Creatinine, Ser: 0.9 mg/dL (ref 0.50–1.10)
GFR calc Af Amer: 76 mL/min — ABNORMAL LOW
GFR calc non Af Amer: 66 mL/min — ABNORMAL LOW
Glucose, Bld: 159 mg/dL — ABNORMAL HIGH (ref 70–99)
Potassium: 4.7 meq/L (ref 3.7–5.3)
Sodium: 146 meq/L (ref 137–147)
Total Bilirubin: 0.3 mg/dL (ref 0.3–1.2)
Total Protein: 7.5 g/dL (ref 6.0–8.3)

## 2014-09-02 NOTE — Progress Notes (Signed)
PULMONARY / CRITICAL CARE MEDICINE   Name: Stephanie Doyle MRN: [id number] DOB: 01-Jun-1951    ADMISSION DATE:  08/26/2014 CONSULTATION DATE:  07/26/2014  REFERRING MD :  Dr. Franky Machoabbell  CHIEF COMPLAINT:  Headache  INITIAL PRESENTATION: 63 year old female with no [facility] medical [facility] with severe headache, found to have SAH. AMS in ED requiring intubation. [facility] PCCM consulted for vent and ICU management.  [facility].   STUDIES:  11/2 CT head > diffuse SAH, frontal and temporal bilaterally.  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 Afebrile. Excellent UO. More responsive 11/18  ID recommending TTE and PICC line changed to left  side. Continues to be on trach and vent. Anasarca +. Low grade fever 75F + 11/19  bilateral thigh rash/redness with eedema - ID stopped vancomycin and monitoring. Low grade fever +. Edema improved with lasix. Na holding at 156 with free water correction. Fails wean 12/07  Failed weaning attempts on 14/5 12/08   Tolerated ATC 12 hours  SUBJECTIVE/OVERNIGHT/INTERVAL HX:  No acute events, weaning on ATC 28%   VITAL SIGNS:  Reviewed, pertinent positives will be addressed in A/P  PHYSICAL EXAMINATION: Gen: chronically ill in NAD on ATC HEENT: scalp incision c/d/i, trach in place, clean PULM: decreased BS and coarse CV: s1 s2 rrr Ab: BS+, soft Ext: edema/anasarca improved Derm: no breakdown but has redness bilateral anterior thigh Neuro:  RASS 0, does not follow commands, tracks & smiles  LABS: PULMONARY  Recent Labs Lab 08/26/14 1830  PHART 7.495*  PCO2ART 48.2*  PO2ART 199.0*  HCO3 36.8*  TCO2 38.2  O2SAT 99.4   CBC  Recent Labs Lab 08/27/14 0614 08/30/14 0451  HGB 9.9* 9.7*  HCT 33.1* 32.7*  WBC 13.7* 11.2*  PLT 415* 339   CHEMISTRY  Recent Labs Lab 08/27/14 0614 08/30/14 0451 08/31/14 0506  NA 140 143 146  K 4.4 4.4 4.4  CL 93* 96 103  CO2 34* 32 33*  GLUCOSE 106* 167* 131*  BUN 55* 84* 76*  CREATININE 0.92 1.61* 1.28*  CALCIUM 10.0 9.6 9.6  MG 2.6*  --   --    Estimated Creatinine Clearance: 33.3 mL/min (by C-G formula based on Cr of 1.28).  IMAGING x48h Dg Chest Port 1 View  09/02/2014   CLINICAL DATA:  Respiratory failure.  EXAM: PORTABLE CHEST - 1 VIEW  COMPARISON:  08/30/2014.  FINDINGS: Tracheostomy tube in good anatomic position. Mediastinal structures are normal. Mild atelectasis and/or infiltrate right mid lung. No pleural effusion or pneumothorax. No acute osseus abnormality.  IMPRESSION: 1. Tracheostomy  tube in stable position.  2. Mild atelectasis versus infiltrate right mid lung.   Electronically Signed   By: Maisie Fushomas  Register   On: 09/02/2014 07:24     ASSESSMENT / PLAN:  NEUROLOGIC A:   Subarachnoid hemorrhage ACA aneurysm - s/p surgical clipping Frontal Lobe infarcts on 11/4 CT head Vasospasm - presumed resolved clinically  Septic shock, resolved HHH therapy in setting of SAH, completed Hypernatremia resolved.  Anasarca  New finding  of moderate mitral regurg on echo 08/01/14 Hypernatremia, improving AKI > resolved Ileus Anemia of critical illness >  monitor Leukocytosis. Hyperglycemia  Acute hypoxemic respiratory failure > HCAP complicated by pulmonary edema, resolving COPD, with mucus plugging, bronchoconstriction Concern for upper airway edema s/p tracheostomy 11/12  Discussion:  63 y/o F w SAH, ACA aneurysm s/p clipping, hospital course complicated by vasospasm, septic shock in the setting of fungemia (candida tropicalis, thought to be line related, since removed).  TTE neg for vegetations.  Now weaning on ATC 28%, goal 24 hours.   P:   Continue attempts at ATC Titrate O2 for sat of 88-92%. Pulmonary hygiene Mobilize as able Cont  duoneb q6H SCD for DVT prevention  Changed R Picc to left Picc 08/11/14 after 48 hour line holiday Continue diflucan for 21 days, start date 11/16 >>12/7   Canary BrimBrandi Ollis, NP-C [place] Pulmonary & Critical Care Pgr: [phone] or [phone]  Liberate TC use as tolerated at this point, extensive neuro damage, would not decannulate anytime soon.  Fungemia addressed at this point, would d/c anti-fungal coverage.  Will need extensive physical rehab prior to any consideration of decannulation.  Patient seen and examined, agree with above note.  I dictated the care and orders written for this patient under my direction.  Alyson ReedyWesam G Arisa Congleton, MD [phone]

## 2014-09-05 LAB — CBC
HCT: 32.9 % — ABNORMAL LOW (ref 36.0–46.0)
Hemoglobin: 9.5 g/dL — ABNORMAL LOW (ref 12.0–15.0)
MCH: 25.7 pg — ABNORMAL LOW (ref 26.0–34.0)
MCHC: 28.9 g/dL — AB (ref 30.0–36.0)
MCV: 89.2 fL (ref 78.0–100.0)
PLATELETS: 248 10*3/uL (ref 150–400)
RBC: 3.69 MIL/uL — ABNORMAL LOW (ref 3.87–5.11)
RDW: 14.7 % (ref 11.5–15.5)
WBC: 12.2 10*3/uL — ABNORMAL HIGH (ref 4.0–10.5)

## 2014-09-05 LAB — BASIC METABOLIC PANEL
Anion gap: 11 (ref 5–15)
BUN: 35 mg/dL — ABNORMAL HIGH (ref 6–23)
CO2: 31 mEq/L (ref 19–32)
Calcium: 9.8 mg/dL (ref 8.4–10.5)
Chloride: 102 mEq/L (ref 96–112)
Creatinine, Ser: 0.75 mg/dL (ref 0.50–1.10)
GFR calc Af Amer: >90 mL/min (ref >90)
GFR calc non Af Amer: 87 mL/min — ABNORMAL LOW (ref >90)
Glucose, Bld: 150 mg/dL — ABNORMAL HIGH (ref 70–99)
POTASSIUM: 4.4 meq/L (ref 3.7–5.3)
SODIUM: 144 meq/L (ref 137–147)

## 2014-09-06 NOTE — Progress Notes (Signed)
PULMONARY / CRITICAL CARE MEDICINE   Name: Stephanie Doyle MRN: [id number] DOB: May 29, 1951    ADMISSION DATE:  08/26/2014 CONSULTATION DATE:  07/26/2014  REFERRING MD :  Dr. Franky Machoabbell  CHIEF COMPLAINT:  Headache  INITIAL PRESENTATION: 63 year old female with no known medical history [facility]2 with severe headache, found to have SAH. AMS in ED requiring intubation. [facility]. PCCM consulted for vent and ICU management.  [facility].   STUDIES:  11/2 CT head > diffuse SAH, frontal and temporal bilaterally.  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 Afebrile. Excellent UO. More responsive 11/18  ID recommending TTE and PICC line changed to left  side. Continues to be on trach and vent. Anasarca +. Low grade fever 44F + 11/19  bilateral thigh rash/redness with eedema - ID stopped vancomycin and monitoring. Low grade fever +. Edema improved with lasix. Na holding at 156 with free water correction. Fails wean 12/07  Failed weaning attempts on 14/5 12/08   Tolerated ATC 12 hours 12/14 TC x 96 hours   SUBJECTIVE/OVERNIGHT/INTERVAL HX:  No acute events, weaning on ATC 28%   VITAL SIGNS: Vital signs reviewed. Abnormal values will appear under impression plan section.    PHYSICAL EXAMINATION: Gen: chronically ill in NAD on ATC HEENT: scalp incision c/d/i, trach in place, clean. Yellow secretions  PULM: decreased BS and coarse CV: s1 s2 rrr Ab: BS+, soft Ext: edema/anasarca improved Derm: no breakdown but has redness bilateral anterior thigh Neuro:  RASS 1, does not follow commands, tracks & smiles  LABS: PULMONARY No results for input(s): PHART, PCO2ART, PO2ART, HCO3, TCO2, O2SAT in the last 168 hours.  Invalid input(s): PCO2, PO2 CBC  Recent Labs Lab 09/02/14 1020 09/05/14 0345  HGB 11.1* 9.5*  HCT 37.8 32.9*  WBC 15.8* 12.2*  PLT 306 248   CHEMISTRY  Recent Labs Lab 08/31/14 0506 09/02/14 1020 09/05/14 0345  NA 146 146 144  K 4.4 4.7 4.4  CL 103 104 102  CO2 33* 29 31  GLUCOSE 131* 159* 150*  BUN 76* 44* 35*  CREATININE 1.28* 0.90 0.75  CALCIUM 9.6 10.8* 9.8   Estimated Creatinine Clearance: 53.3 mL/min (by C-G formula based on Cr of 0.75).  IMAGING x48h No results found.   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  Anasarca  New finding of moderate mitral regurg on echo 08/01/14 Hypernatremia,  improving AKI > resolved Ileus Anemia of critical illness >  monitor Leukocytosis. Hyperglycemia  Acute hypoxemic respiratory failure > HCAP complicated by pulmonary edema, resolving COPD, with mucus plugging, bronchoconstriction Concern for upper airway edema s/p tracheostomy 11/12  Discussion:  63 y/o F w SAH, ACA aneurysm s/p clipping, hospital course complicated by vasospasm,  septic shock in the setting of fungemia (candida tropicalis, thought to be line related, since removed).  TTE neg for vegetations. Off vent x 72 hours  P:   ATC x 72 hrs Neuro status prevents any decannulation in near future. Titrate O2 for sat of 88-92%. Pulmonary hygiene Mobilize as able Cont  duoneb q6H SCD for DVT prevention  PCCM will weekly.   Brett CanalesSteve Minor ACNP [facility] PCCM Pager [phone] till 3 pm If no answer page [phone] 09/06/2014, 9:29 AM   PCCM ATTENDING: Pt seen on work rounds with care provider noted above. We reviewed pt's initial presentation, consultants notes and hospital database in detail.  The above assessment and plan was formulated under my direction.  Presently tolerating ATC well. Plan is as outlined above. PCCM will continue wo see weekly. Please call PRN otherwise  Billy Fischeravid Urho Rio, MD;  PCCM service; Mobile [phone]

## 2014-09-08 LAB — BASIC METABOLIC PANEL
Anion gap: 9 (ref 5–15)
BUN: 26 mg/dL — ABNORMAL HIGH (ref 6–23)
CALCIUM: 9.5 mg/dL (ref 8.4–10.5)
CO2: 30 mEq/L (ref 19–32)
Chloride: 93 mEq/L — ABNORMAL LOW (ref 96–112)
Creatinine, Ser: 0.57 mg/dL (ref 0.50–1.10)
GFR calc Af Amer: >90 mL/min (ref >90)
Glucose, Bld: 158 mg/dL — ABNORMAL HIGH (ref 70–99)
POTASSIUM: 4.6 meq/L (ref 3.7–5.3)
SODIUM: 132 meq/L — AB (ref 137–147)

## 2014-09-10 ENCOUNTER — Other Ambulatory Visit (HOSPITAL_COMMUNITY): Payer: 59

## 2014-09-13 DIAGNOSIS — I609 Nontraumatic subarachnoid hemorrhage, unspecified: Secondary | ICD-10-CM

## 2014-09-13 NOTE — Progress Notes (Signed)
PULMONARY / CRITICAL CARE MEDICINE   Name: Stephanie Doyle: [id number] DOB: 1951/02/27    ADMISSION DATE:  08/26/2014 CONSULTATION DATE:  07/26/2014  [facility]  CHIEF COMPLAINT:  Headache  INITIAL PRESENTATION: 63 year old female with no known medical history [facility] with severe headache, found to have SAH. AMS in ED requiring intubation. She was transferred to [facility] for further eval. PCCM consulted for vent and ICU management.  Tx to [facility] 12/7 for vent weaning.   STUDIES:  11/2 CT head > diffuse SAH, frontal and temporal bilaterally.  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 Afebrile. Excellent UO. More responsive 11/18  ID recommending TTE and PICC line changed to left  side. Continues to be on trach and vent. Anasarca +. Low grade fever 1F + 11/19  bilateral thigh rash/redness with eedema - ID stopped vancomycin and monitoring. Low grade fever +. Edema improved with lasix. Na holding at 156 with free water correction. Fails wean 12/07  Failed weaning attempts on 14/5 12/08   Tolerated ATC 12 hours 12/14 TC x 96 hours 12/21 consideration to capping trach  SUBJECTIVE/OVERNIGHT/INTERVAL HX:  No acute events, on ATC 28% for a week   VITAL SIGNS: Vital signs reviewed. Abnormal values will appear under impression plan section.    PHYSICAL EXAMINATION: Gen: chronically ill in NAD on ATC HEENT: scalp incision c/d/i, trach in place, clean.  PULM: decreased BS and mild rhonchi CV: s1 s2 rrr Ab: BS+, soft Ext: edema/anasarca improved Derm: no breakdown but has redness bilateral anterior thigh Neuro:  RASS 1, does not follow commands, tracks & smiles  LABS: PULMONARY No results for input(s): PHART, PCO2ART, PO2ART, HCO3, TCO2, O2SAT in the last 168 hours.  Invalid input(s): PCO2, PO2 CBC No results for input(s): HGB, HCT, WBC, PLT in the last 168 hours. CHEMISTRY  Recent Labs Lab 09/08/14 0731  NA 132*  K 4.6  CL 93*  CO2 30  GLUCOSE 158*  BUN 26*  CREATININE 0.57  CALCIUM 9.5   CrCl cannot be calculated (Unknown ideal weight.).  IMAGING x48h No results found.   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  Anasarca  New finding of moderate mitral regurg on echo 08/01/14 Hypernatremia, improving AKI > resolved Ileus Anemia of critical illness >  monitor Leukocytosis. Hyperglycemia  Acute hypoxemic respiratory failure > HCAP complicated by pulmonary edema, resolving COPD, with mucus plugging, bronchoconstriction Concern for upper airway edema  s/p tracheostomy 11/12  Discussion:  63 y/o F w SAH, ACA aneurysm s/p clipping, hospital course complicated by vasospasm, septic shock in the setting of fungemia (candida tropicalis, thought to be line related, since removed).  TTE neg for vegetations. Off vent since  12/10  P:   ATC 24/7 Neuro status improving as of 12/21, may consider capping trach as prelude to decannulation. Titrate O2 for sat of 88-92%. Pulmonary hygiene Mobilize as able Cont  duoneb q6H SCD for DVT prevention  PCCM will weekly.   Stephanie Doyle ACNP [facility] PCCM Pager [phone] till 3 pm If no answer page [phone] 09/13/2014, 9:01 AM   PCCM ATTENDING: She has improved and is tolerating trach collar 24 hours, and able to manage secretions. He would still need to see further improvement in her physical conditioning before considering decannulation. Can undertake capping trials  Stephanie Doyle

## 2014-09-15 LAB — BASIC METABOLIC PANEL
ANION GAP: 8 (ref 5–15)
BUN: 31 mg/dL — ABNORMAL HIGH (ref 6–23)
CALCIUM: 9.3 mg/dL (ref 8.4–10.5)
CO2: 32 mmol/L (ref 19–32)
Chloride: 97 mEq/L (ref 96–112)
Creatinine, Ser: 0.79 mg/dL (ref 0.50–1.10)
GFR calc non Af Amer: 86 mL/min — ABNORMAL LOW (ref >90)
Glucose, Bld: 140 mg/dL — ABNORMAL HIGH (ref 70–99)
Potassium: 4.8 mmol/L (ref 3.5–5.1)
SODIUM: 137 mmol/L (ref 135–145)

## 2014-09-15 LAB — CBC
HCT: 34.2 % — ABNORMAL LOW (ref 36.0–46.0)
Hemoglobin: 10.2 g/dL — ABNORMAL LOW (ref 12.0–15.0)
MCH: 26.4 pg (ref 26.0–34.0)
MCHC: 29.8 g/dL — ABNORMAL LOW (ref 30.0–36.0)
MCV: 88.4 fL (ref 78.0–100.0)
PLATELETS: 269 10*3/uL (ref 150–400)
RBC: 3.87 MIL/uL (ref 3.87–5.11)
RDW: 15.8 % — AB (ref 11.5–15.5)
WBC: 8.6 10*3/uL (ref 4.0–10.5)

## 2014-09-22 ENCOUNTER — Encounter: Payer: Self-pay | Admitting: Internal Medicine

## 2014-09-22 NOTE — Progress Notes (Signed)
PULMONARY / CRITICAL CARE MEDICINE   Name: Stephanie Doyle MRN: [id number] DOB: 08/26/1951    ADMISSION DATE:  08/26/2014 CONSULTATION DATE:  07/26/2014  [facility] : [facility]  CHIEF COMPLAINT:  Headache  INITIAL PRESENTATION: 63 year old female with no known medical history [facility] with severe headache, found to have SAH. AMS in ED requiring intubation. [facility] PCCM consulted for vent and ICU management.  [facility].   STUDIES:  11/2 CT head > diffuse SAH, frontal and temporal bilaterally.  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 Status post anterior communicating artery aneurysm clipping; frontal lobe edema stable, small volume intraventricular hemorrhage stable, regression of SAH, no new intracranial abnormality  SIGNIFICANT EVENTS: 11/02  SAH, Intubated for airway protection, [facility] 11/04  Right pterional craniotomy for clipping of anterior communicating artery aneurysm, complex 11/05  no cuff leak >> started steroids 11/07  not weaning , lowest ps was 12 11/12  trach > 11/14  hgb 6.6, no active sign of bleeding 11/15  worsening renal failure, vasopressin and neo added for BP goals 11/17  Hb 6.7 - 1 U. Afebrile. Excellent UO. More responsive 11/18  ID recommending TTE and PICC line changed to left  side. Continues to be on trach and vent. Anasarca +. Low grade fever 51F + 11/19  bilateral thigh rash/redness with eedema - ID stopped vancomycin and monitoring. Low grade fever +. Edema improved with lasix. Na holding at 156 with free water correction. Fails wean 12/07  Failed weaning attempts on 14/5 12/08   Tolerated ATC 12 hours 12/14 TC x 96 hours 12/21 consideration to capping trach 12/28 self decannulated  SUBJECTIVE/OVERNIGHT/INTERVAL HX:  Doing well, decannulated   VITAL SIGNS: Vital signs reviewed. Abnormal values will appear under impression plan section.    PHYSICAL EXAMINATION: Gen: chronically ill in NAD on ATC HEENT: scalp incision c/d/i, trachsite ok PULM: decreased BS  CV: s1 s2 rrr Ab: BS+, soft Ext: edema/anasarca improved Derm: no breakdown but has redness bilateral anterior thigh Neuro:  follows commands  LABS: PULMONARY No results for input(s): PHART, PCO2ART, PO2ART, HCO3, TCO2, O2SAT in the last 168 hours.  Invalid input(s): PCO2, PO2 CBC No results for input(s): HGB, HCT, WBC, PLT in the last 168 hours. CHEMISTRY No results for input(s): NA, K, CL, CO2, GLUCOSE, BUN, CREATININE, CALCIUM, MG, PHOS in the last 168 hours. CrCl cannot be calculated (Unknown ideal weight.).  IMAGING x48h No results found.   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  Anasarca  New finding of moderate mitral regurg on echo 08/01/14 Hypernatremia, improving AKI > resolved Ileus Anemia of critical illness >  monitor Leukocytosis. Hyperglycemia  Acute hypoxemic respiratory failure > HCAP complicated by pulmonary edema, resolving COPD, with mucus plugging, bronchoconstriction Concern for upper airway edema s/p tracheostomy 11/12  Discussion:  63 y/o F w SAH, ACA aneurysm s/p clipping, hospital course complicated by vasospasm, septic shock in the setting of fungemia (candida  tropicalis, thought to be line related, since removed).  TTE neg for vegetations. Off vent since 12/10  P:   Tolerating decannulation. Titrate O2 for sat of  88-92%. Pulmonary hygiene Mobilize as able Cont  duoneb q6H SCD for DVT prevention  PCCM to sign off   09/22/2014, 12:54 PM  Oretha MilchALVA,Jazper Nikolai V. MD

## 2014-09-24 ENCOUNTER — Encounter: Payer: Self-pay | Admitting: Internal Medicine

## 2014-09-28 LAB — CBC WITH DIFFERENTIAL/PLATELET
Basophil #: 0.1 10*3/uL (ref 0.0–0.1)
Basophil %: 0.3 %
EOS ABS: 0.1 10*3/uL (ref 0.0–0.7)
Eosinophil %: 0.4 %
HCT: 37.3 % (ref 35.0–47.0)
HGB: 11.3 g/dL — AB (ref 12.0–16.0)
Lymphocyte #: 1.6 10*3/uL (ref 1.0–3.6)
Lymphocyte %: 7.6 %
MCH: 26.6 pg (ref 26.0–34.0)
MCHC: 30.3 g/dL — AB (ref 32.0–36.0)
MCV: 88 fL (ref 80–100)
MONO ABS: 1.5 x10 3/mm — AB (ref 0.2–0.9)
Monocyte %: 7.2 %
NEUTROS ABS: 17.7 10*3/uL — AB (ref 1.4–6.5)
Neutrophil %: 84.5 %
Platelet: 293 10*3/uL (ref 150–440)
RBC: 4.25 10*6/uL (ref 3.80–5.20)
RDW: 17.1 % — AB (ref 11.5–14.5)
WBC: 20.9 10*3/uL — AB (ref 3.6–11.0)

## 2014-09-28 LAB — COMPREHENSIVE METABOLIC PANEL
ALK PHOS: 96 U/L
ALT: 106 U/L — AB
AST: 28 U/L (ref 15–37)
Albumin: 2.4 g/dL — ABNORMAL LOW (ref 3.4–5.0)
Anion Gap: 5 — ABNORMAL LOW (ref 7–16)
BILIRUBIN TOTAL: 0.4 mg/dL (ref 0.2–1.0)
BUN: 37 mg/dL — ABNORMAL HIGH (ref 7–18)
CALCIUM: 9.3 mg/dL (ref 8.5–10.1)
CO2: 34 mmol/L — AB (ref 21–32)
CREATININE: 0.83 mg/dL (ref 0.60–1.30)
Chloride: 107 mmol/L (ref 98–107)
EGFR (African American): >60
Glucose: 105 mg/dL — ABNORMAL HIGH (ref 65–99)
Osmolality: 300 (ref 275–301)
Potassium: 4.1 mmol/L (ref 3.5–5.1)
Sodium: 146 mmol/L — ABNORMAL HIGH (ref 136–145)
Total Protein: 6.8 g/dL (ref 6.4–8.2)

## 2014-09-28 LAB — TSH: Thyroid Stimulating Horm: 11.5 u[IU]/mL — ABNORMAL HIGH

## 2014-09-30 LAB — URINALYSIS, COMPLETE
Bilirubin,UR: NEGATIVE
Blood: NEGATIVE
GLUCOSE, UR: NEGATIVE mg/dL (ref 0–75)
KETONE: NEGATIVE
Nitrite: NEGATIVE
Ph: 6 (ref 4.5–8.0)
Protein: NEGATIVE
SPECIFIC GRAVITY: 1.02 (ref 1.003–1.030)
Squamous Epithelial: <1
WBC UR: 28 /HPF (ref 0–5)

## 2014-10-02 LAB — URINE CULTURE

## 2014-10-05 NOTE — Discharge Summary (Signed)
  Physician Discharge Summary  Patient ID: Stephanie SilvasVicky D Garnett MRN: [id number] DOB/AGE: [birth year] 64 y.o.  Admit date: 07/26/2014 Discharge date: 10/05/2014  Admission Diagnoses: Subarachnoid Hemorrhage  Discharge Diagnoses: Same Principal Problem:   Subarachnoid hemorrhage Active Problems:   Acute respiratory failure   Absolute anemia   Hypomagnesemia   Hypophosphatemia   Acute on chronic respiratory failure   Fungemia   HCAP (healthcare-associated pneumonia)   Acute respiratory failure with hypoxia   Hypernatremia   COPD, moderate   Discharged Condition: Stable  Hospital Course:  Mrs. Stephanie SilvasVicky D Katzenstein is a 64 y.o. female initially admitted after sudden onset of HA. Workup demonstrated subarachnoid hemorrhage. She underwent diagnostic cerebral angiogram and subsequently underwent craniotomy for clipping of an Acom aneurysm. Postoperatively, she was monitored in the neuro ICU. She did have a left hemiparesis which slowly partially improved. Her hospital course was also complicated by VDRF requiring tracheostomy for prolonged mechanical ventilation and PEG placement. She also developed hypernatremia which did resolve and fungemia treated with fluconazole. She was ultimately unable to be weaned of mechanical ventilation completely but was otherwise neurologically and hemodynamically stable and therefore [facility]  Treatments: Surgery  Diagnostic cerebral angiogram Craniotomy for clipping of Acom aneurysm Trach PEG  Discharge Exam: Blood pressure 98/48, pulse 93, temperature 98.8 F (37.1 C), temperature source Oral, resp. rate 16, height 5\' 3"  (1.6 m), weight 48.2 kg (106 lb 4.2 oz), SpO2 100 %. Moves RUE/LUE/RLE spontaneously Occasionally follows simple commands Wound c/d/i  Follow-up: Follow-up in my office [facility] [phone]) in 4-6 weeks  Disposition: 03-Skilled Nursing Facility     Medication List    ASK your  doctor about these medications        benzonatate 100 MG capsule  Commonly known as:  TESSALON  Take 100 mg by mouth 3 (three) times daily as needed for cough.     fluticasone 50 MCG/ACT nasal spray  Commonly known as:  FLONASE  Place 2 sprays into both nostrils daily.     SPIRIVA HANDIHALER 18 MCG inhalation capsule  Generic drug:  tiotropium  Place 18 mcg into inhaler and inhale daily.     SYMBICORT 160-4.5 MCG/ACT inhaler  Generic drug:  budesonide-formoterol  Inhale 2 puffs into the lungs 2 (two) times daily.         SignedLisbeth Renshaw: Callahan Wild, C 10/05/2014, 3:19 PM

## 2014-10-07 ENCOUNTER — Encounter (HOSPITAL_COMMUNITY): Payer: Self-pay | Admitting: Neurosurgery

## 2014-10-25 ENCOUNTER — Encounter: Payer: Self-pay | Admitting: Internal Medicine

## 2014-11-23 ENCOUNTER — Encounter
Admit: 2014-11-23 | Disposition: A | Discharge status: Home / Self Care | Payer: Self-pay | Attending: Internal Medicine | Admitting: Internal Medicine

## 2014-12-04 ENCOUNTER — Emergency Department: Payer: Self-pay | Admitting: Emergency Medicine

## 2014-12-24 ENCOUNTER — Encounter
Admit: 2014-12-24 | Disposition: A | Discharge status: Home / Self Care | Payer: Self-pay | Attending: Internal Medicine | Admitting: Internal Medicine

## 2015-02-17 ENCOUNTER — Encounter
Admission: RE | Admit: 2015-02-17 | Discharge: 2015-02-17 | Disposition: A | Discharge status: Home / Self Care | Payer: Medicare Other | Source: Ambulatory Visit | Attending: Internal Medicine | Admitting: Internal Medicine

## 2015-02-23 ENCOUNTER — Encounter
Admission: RE | Admit: 2015-02-23 | Discharge: 2015-02-23 | Disposition: A | Discharge status: Home / Self Care | Payer: Medicare Other | Source: Ambulatory Visit | Attending: Internal Medicine | Admitting: Internal Medicine

## 2015-03-25 ENCOUNTER — Encounter
Admission: RE | Admit: 2015-03-25 | Discharge: 2015-03-25 | Disposition: A | Discharge status: Home / Self Care | Payer: Medicare Other | Source: Ambulatory Visit | Attending: Internal Medicine | Admitting: Internal Medicine

## 2015-04-25 ENCOUNTER — Encounter
Admission: RE | Admit: 2015-04-25 | Discharge: 2015-04-25 | Disposition: A | Discharge status: Home / Self Care | Payer: Medicare Other | Source: Ambulatory Visit | Attending: Internal Medicine | Admitting: Internal Medicine

## 2015-04-25 DIAGNOSIS — R350 Frequency of micturition: Secondary | ICD-10-CM | POA: Insufficient documentation

## 2015-04-25 DIAGNOSIS — E119 Type 2 diabetes mellitus without complications: Secondary | ICD-10-CM | POA: Insufficient documentation

## 2015-05-12 LAB — GLUCOSE, CAPILLARY
Glucose-Capillary: 154 mg/dL — ABNORMAL HIGH (ref 65–99)
Glucose-Capillary: 81 mg/dL (ref 65–99)

## 2015-05-13 LAB — GLUCOSE, CAPILLARY: GLUCOSE-CAPILLARY: 70 mg/dL (ref 65–99)

## 2015-05-17 DIAGNOSIS — R350 Frequency of micturition: Secondary | ICD-10-CM | POA: Diagnosis present

## 2015-05-17 DIAGNOSIS — E119 Type 2 diabetes mellitus without complications: Secondary | ICD-10-CM | POA: Diagnosis present

## 2015-05-17 LAB — URINALYSIS COMPLETE WITH MICROSCOPIC (ARMC ONLY)
BILIRUBIN URINE: NEGATIVE
Glucose, UA: NEGATIVE mg/dL
Hgb urine dipstick: NEGATIVE
KETONES UR: NEGATIVE mg/dL
NITRITE: NEGATIVE
PH: 6 (ref 5.0–8.0)
Protein, ur: NEGATIVE mg/dL
Specific Gravity, Urine: 1.014 (ref 1.005–1.030)

## 2015-05-17 LAB — GLUCOSE, CAPILLARY: GLUCOSE-CAPILLARY: 73 mg/dL (ref 65–99)

## 2015-05-18 DIAGNOSIS — R350 Frequency of micturition: Secondary | ICD-10-CM | POA: Diagnosis not present

## 2015-05-18 LAB — GLUCOSE, CAPILLARY: Glucose-Capillary: 69 mg/dL (ref 65–99)

## 2015-05-19 LAB — URINE CULTURE

## 2015-05-21 LAB — GLUCOSE, CAPILLARY: GLUCOSE-CAPILLARY: 87 mg/dL (ref 65–99)

## 2015-05-22 LAB — GLUCOSE, CAPILLARY: GLUCOSE-CAPILLARY: 98 mg/dL (ref 65–99)

## 2015-05-23 LAB — GLUCOSE, CAPILLARY: Glucose-Capillary: 87 mg/dL (ref 65–99)

## 2015-05-25 LAB — GLUCOSE, CAPILLARY: Glucose-Capillary: 81 mg/dL (ref 65–99)

## 2015-05-26 ENCOUNTER — Encounter
Admission: RE | Admit: 2015-05-26 | Discharge: 2015-05-26 | Disposition: A | Discharge status: Home / Self Care | Payer: Medicare Other | Source: Ambulatory Visit | Attending: Internal Medicine | Admitting: Internal Medicine

## 2015-05-26 DIAGNOSIS — E46 Unspecified protein-calorie malnutrition: Secondary | ICD-10-CM | POA: Insufficient documentation

## 2015-05-27 LAB — GLUCOSE, CAPILLARY: Glucose-Capillary: 87 mg/dL (ref 65–99)

## 2015-05-31 LAB — GLUCOSE, CAPILLARY: Glucose-Capillary: 108 mg/dL — ABNORMAL HIGH (ref 65–99)

## 2015-06-01 LAB — GLUCOSE, CAPILLARY: GLUCOSE-CAPILLARY: 75 mg/dL (ref 65–99)

## 2015-06-04 LAB — GLUCOSE, CAPILLARY: Glucose-Capillary: 101 mg/dL — ABNORMAL HIGH (ref 65–99)

## 2015-06-05 LAB — GLUCOSE, CAPILLARY: Glucose-Capillary: 110 mg/dL — ABNORMAL HIGH (ref 65–99)

## 2015-06-06 LAB — GLUCOSE, CAPILLARY: GLUCOSE-CAPILLARY: 98 mg/dL (ref 65–99)

## 2015-06-09 LAB — GLUCOSE, CAPILLARY: Glucose-Capillary: 88 mg/dL (ref 65–99)

## 2015-06-10 LAB — GLUCOSE, CAPILLARY: GLUCOSE-CAPILLARY: 105 mg/dL — AB (ref 65–99)

## 2015-06-14 LAB — GLUCOSE, CAPILLARY: GLUCOSE-CAPILLARY: 92 mg/dL (ref 65–99)

## 2015-06-15 LAB — GLUCOSE, CAPILLARY: GLUCOSE-CAPILLARY: 112 mg/dL — AB (ref 65–99)

## 2015-06-19 LAB — GLUCOSE, CAPILLARY: Glucose-Capillary: 130 mg/dL — ABNORMAL HIGH (ref 65–99)

## 2015-06-20 LAB — GLUCOSE, CAPILLARY: Glucose-Capillary: 116 mg/dL — ABNORMAL HIGH (ref 65–99)

## 2015-06-21 ENCOUNTER — Encounter: Admission: RE | Payer: Self-pay | Source: Ambulatory Visit

## 2015-06-21 ENCOUNTER — Ambulatory Visit: Admission: RE | Admit: 2015-06-21 | Payer: Medicare Other | Source: Ambulatory Visit | Admitting: Ophthalmology

## 2015-06-21 SURGERY — PHACOEMULSIFICATION, CATARACT, WITH IOL INSERTION
Anesthesia: Choice | Laterality: Right

## 2015-06-23 DIAGNOSIS — E46 Unspecified protein-calorie malnutrition: Secondary | ICD-10-CM | POA: Diagnosis present

## 2015-06-23 LAB — CBC WITH DIFFERENTIAL/PLATELET
Basophils Absolute: 0 10*3/uL (ref 0–0.1)
Basophils Relative: 1 %
EOS PCT: 5 %
Eosinophils Absolute: 0.3 10*3/uL (ref 0–0.7)
HEMATOCRIT: 39.5 % (ref 35.0–47.0)
Hemoglobin: 12.7 g/dL (ref 12.0–16.0)
LYMPHS PCT: 27 %
Lymphs Abs: 1.6 10*3/uL (ref 1.0–3.6)
MCH: 27.7 pg (ref 26.0–34.0)
MCHC: 32.2 g/dL (ref 32.0–36.0)
MCV: 86.2 fL (ref 80.0–100.0)
MONO ABS: 0.8 10*3/uL (ref 0.2–0.9)
MONOS PCT: 14 %
NEUTROS ABS: 3.2 10*3/uL (ref 1.4–6.5)
Neutrophils Relative %: 53 %
PLATELETS: 263 10*3/uL (ref 150–440)
RBC: 4.58 MIL/uL (ref 3.80–5.20)
RDW: 13.5 % (ref 11.5–14.5)
WBC: 6 10*3/uL (ref 3.6–11.0)

## 2015-06-23 LAB — LIPID PANEL
CHOL/HDL RATIO: 2.6 ratio
CHOLESTEROL: 165 mg/dL (ref 0–200)
HDL: 64 mg/dL (ref >40)
LDL Cholesterol: 85 mg/dL (ref 0–99)
TRIGLYCERIDES: 81 mg/dL (ref <150)
VLDL: 16 mg/dL (ref 0–40)

## 2015-06-23 LAB — COMPREHENSIVE METABOLIC PANEL
ALT: 29 U/L (ref 14–54)
ANION GAP: 9 (ref 5–15)
AST: 29 U/L (ref 15–41)
Albumin: 3.2 g/dL — ABNORMAL LOW (ref 3.5–5.0)
Alkaline Phosphatase: 62 U/L (ref 38–126)
BILIRUBIN TOTAL: 0.6 mg/dL (ref 0.3–1.2)
BUN: 21 mg/dL — AB (ref 6–20)
CHLORIDE: 89 mmol/L — AB (ref 101–111)
CO2: 34 mmol/L — ABNORMAL HIGH (ref 22–32)
Calcium: 9.2 mg/dL (ref 8.9–10.3)
Creatinine, Ser: 0.79 mg/dL (ref 0.44–1.00)
Glucose, Bld: 213 mg/dL — ABNORMAL HIGH (ref 65–99)
POTASSIUM: 4.2 mmol/L (ref 3.5–5.1)
Sodium: 132 mmol/L — ABNORMAL LOW (ref 135–145)
TOTAL PROTEIN: 6.7 g/dL (ref 6.5–8.1)

## 2015-06-23 LAB — HEMOGLOBIN A1C: HEMOGLOBIN A1C: 6 % (ref 4.0–6.0)

## 2015-06-23 LAB — GLUCOSE, CAPILLARY: GLUCOSE-CAPILLARY: 93 mg/dL (ref 65–99)

## 2015-06-23 LAB — TSH: TSH: 2.44 u[IU]/mL (ref 0.350–4.500)

## 2015-06-23 LAB — MAGNESIUM: Magnesium: 1.7 mg/dL (ref 1.7–2.4)

## 2015-06-23 LAB — VITAMIN B12: VITAMIN B 12: 912 pg/mL (ref 180–914)

## 2015-06-24 LAB — GLUCOSE, CAPILLARY: GLUCOSE-CAPILLARY: 95 mg/dL (ref 65–99)

## 2015-06-25 ENCOUNTER — Encounter
Admission: RE | Admit: 2015-06-25 | Discharge: 2015-06-25 | Disposition: A | Discharge status: Home / Self Care | Payer: Medicare Other | Source: Ambulatory Visit | Attending: Internal Medicine | Admitting: Internal Medicine

## 2015-06-28 LAB — GLUCOSE, CAPILLARY: GLUCOSE-CAPILLARY: 90 mg/dL (ref 65–99)

## 2015-06-29 LAB — GLUCOSE, CAPILLARY: Glucose-Capillary: 109 mg/dL — ABNORMAL HIGH (ref 65–99)

## 2015-06-30 LAB — GLUCOSE, CAPILLARY: Glucose-Capillary: 90 mg/dL (ref 65–99)

## 2015-06-30 LAB — VITAMIN D 1,25 DIHYDROXY
Vitamin D 1, 25 (OH)2 Total: 49 pg/mL
Vitamin D2 1, 25 (OH)2: <10 pg/mL
Vitamin D3 1, 25 (OH)2: 49 pg/mL

## 2015-07-01 LAB — GLUCOSE, CAPILLARY: Glucose-Capillary: 87 mg/dL (ref 65–99)

## 2015-07-02 LAB — GLUCOSE, CAPILLARY: GLUCOSE-CAPILLARY: 73 mg/dL (ref 65–99)

## 2015-07-03 LAB — GLUCOSE, CAPILLARY: GLUCOSE-CAPILLARY: 84 mg/dL (ref 65–99)

## 2015-07-04 LAB — GLUCOSE, CAPILLARY: GLUCOSE-CAPILLARY: 81 mg/dL (ref 65–99)

## 2015-07-07 LAB — GLUCOSE, CAPILLARY: GLUCOSE-CAPILLARY: 83 mg/dL (ref 65–99)

## 2015-07-08 LAB — GLUCOSE, CAPILLARY: Glucose-Capillary: 76 mg/dL (ref 65–99)

## 2015-07-09 LAB — GLUCOSE, CAPILLARY: GLUCOSE-CAPILLARY: 78 mg/dL (ref 65–99)

## 2015-07-12 LAB — GLUCOSE, CAPILLARY: GLUCOSE-CAPILLARY: 108 mg/dL — AB (ref 65–99)

## 2015-07-13 LAB — GLUCOSE, CAPILLARY: Glucose-Capillary: 84 mg/dL (ref 65–99)

## 2015-07-16 LAB — GLUCOSE, CAPILLARY: GLUCOSE-CAPILLARY: 74 mg/dL (ref 65–99)

## 2015-07-17 IMAGING — CT CT HEAD W/O CM
1 series · 15 of 30 positions shown, 19 images · non-contrast
Comparison: 08/09/2014

CLINICAL DATA: Hypoxic episode.

EXAM:
CT HEAD WITHOUT CONTRAST
TECHNIQUE: Contiguous axial images were obtained from the base of the skull
through the vertex without intravenous contrast.

[Series 2: head 5.0 h30s · axial · 0.43mm/px · z∈[+1432,+1567]mm · 15 of 31 slices shown, 19 images]
[im 2/31  brain]
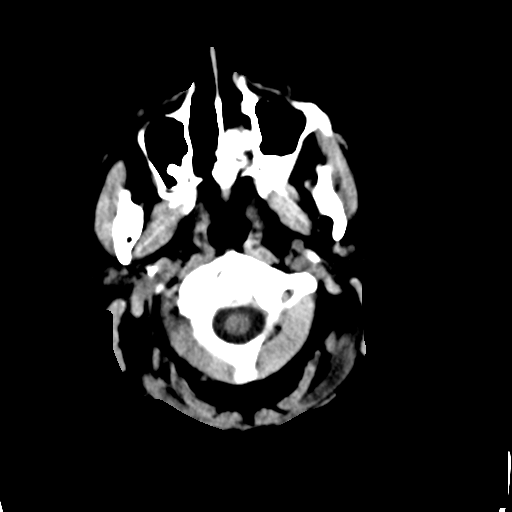
[im 2/31  bone]
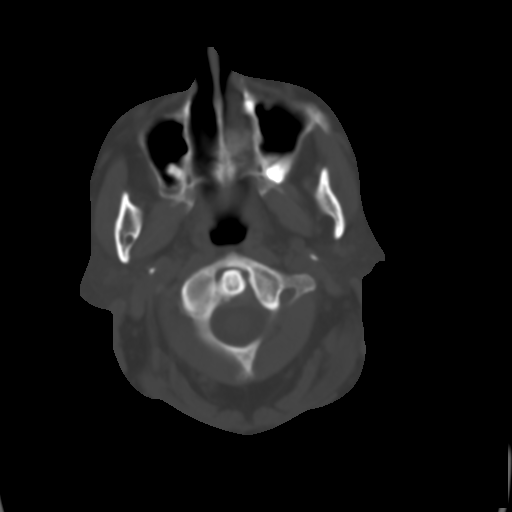
[im 4/31  brain]
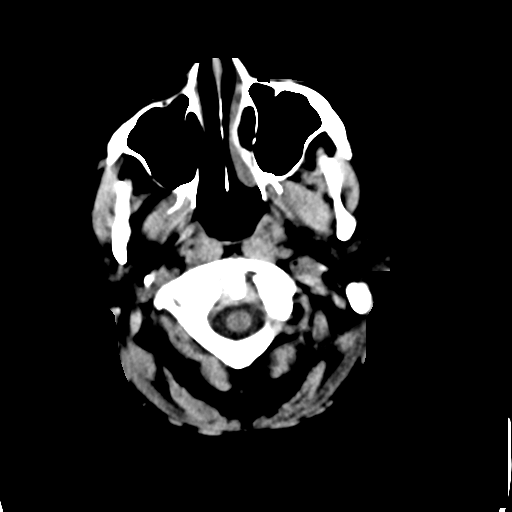
[im 6/31  brain]
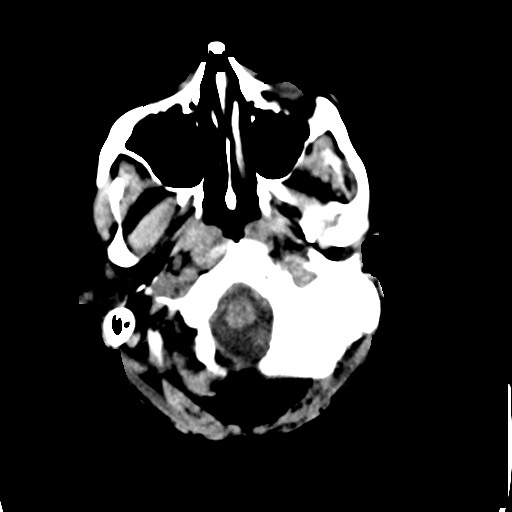
[im 8/31  brain]
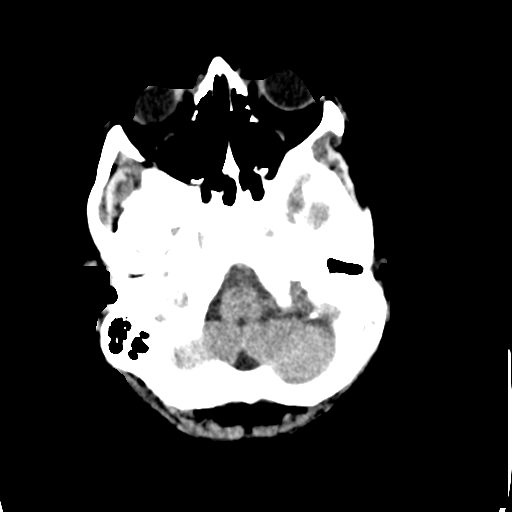
[im 10/31  brain]
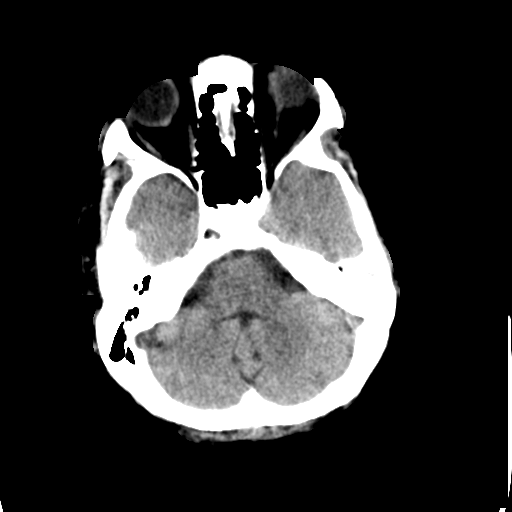
[im 10/31  bone]
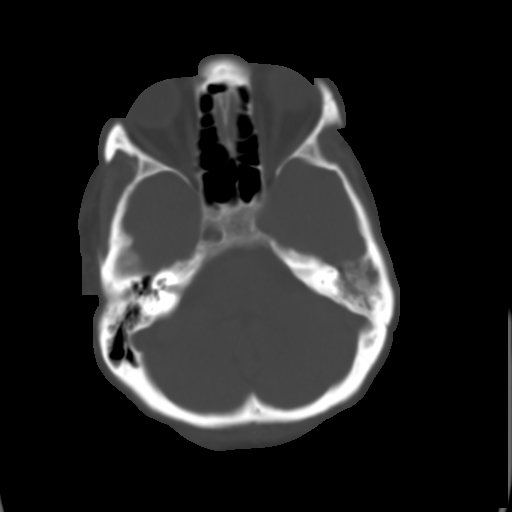
[im 12/31  brain]
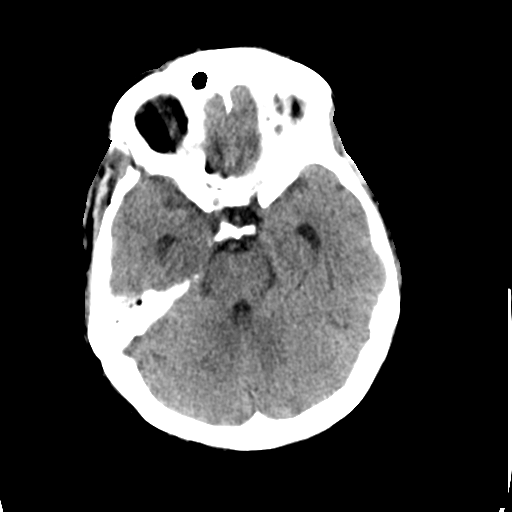
[im 14/31  brain]
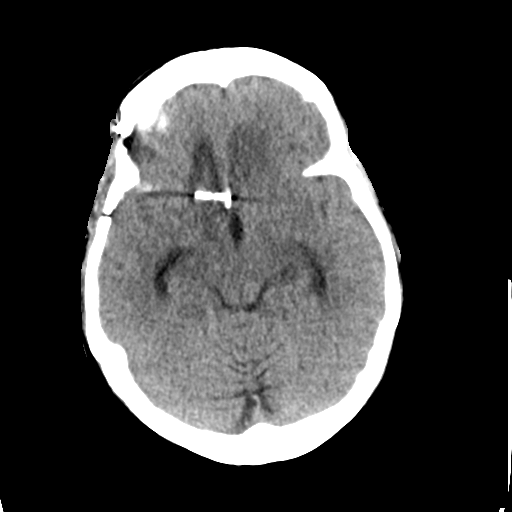
[im 16/31  brain]
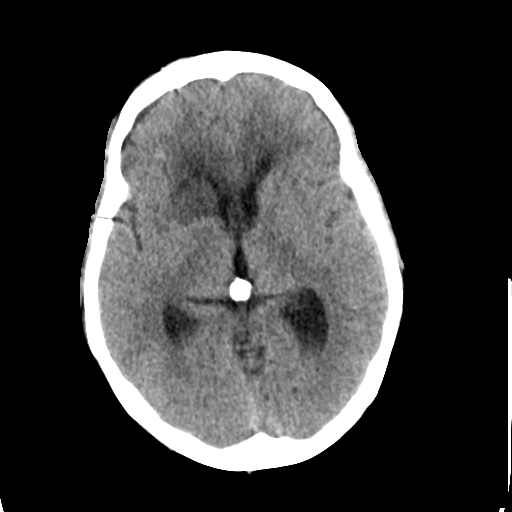
[im 17/31  brain]
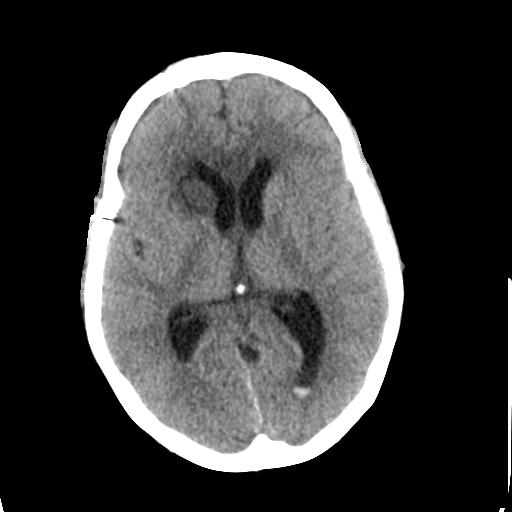
[im 17/31  bone]
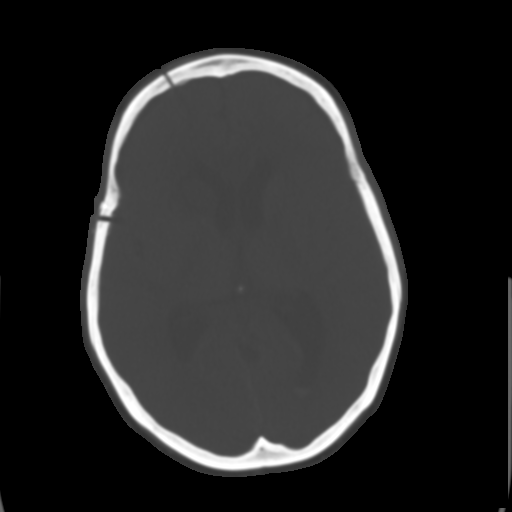
[im 19/31  brain]
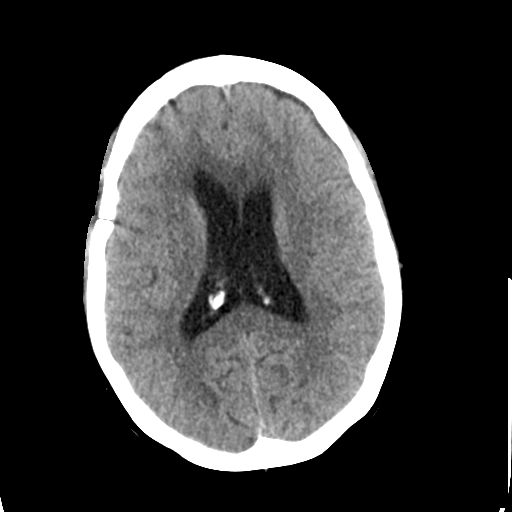
[im 21/31  brain]
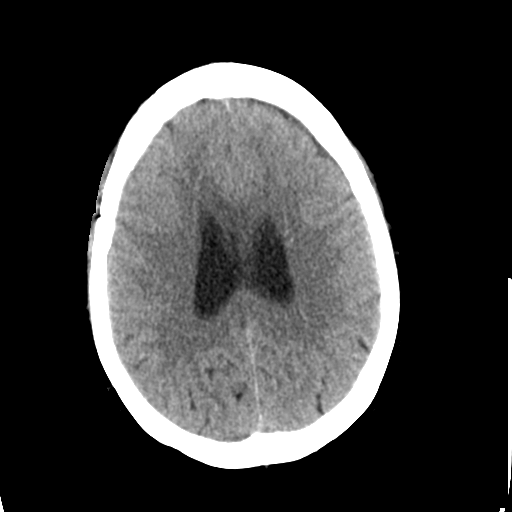
[im 23/31  brain]
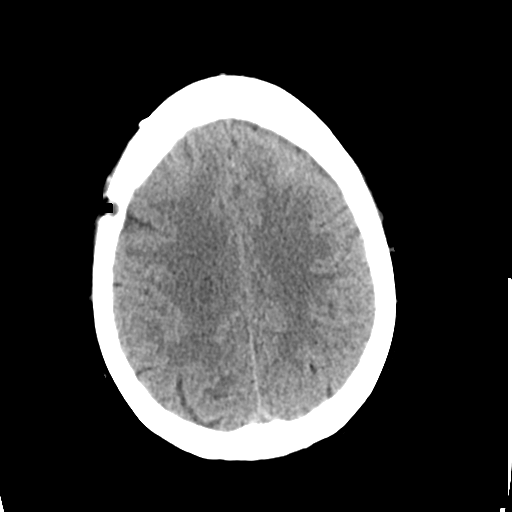
[im 25/31  brain]
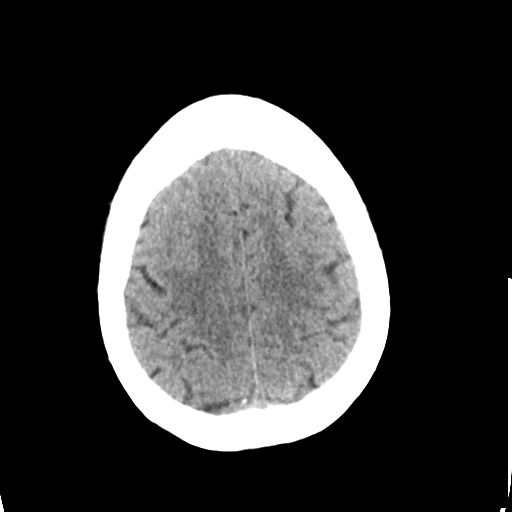
[im 25/31  bone]
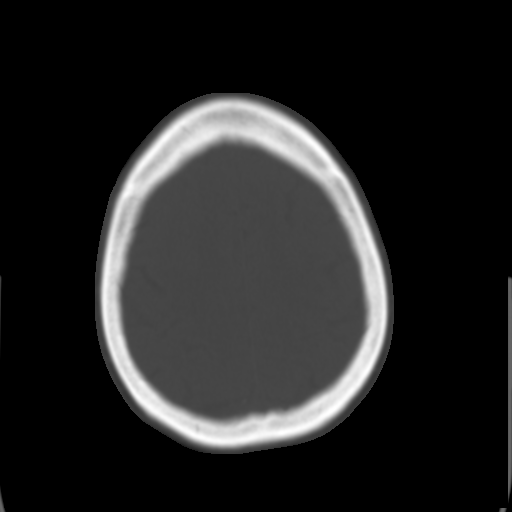
[im 27/31  brain]
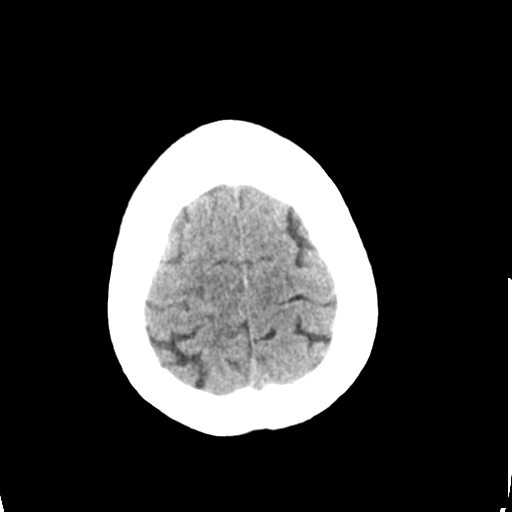
[im 29/31  brain]
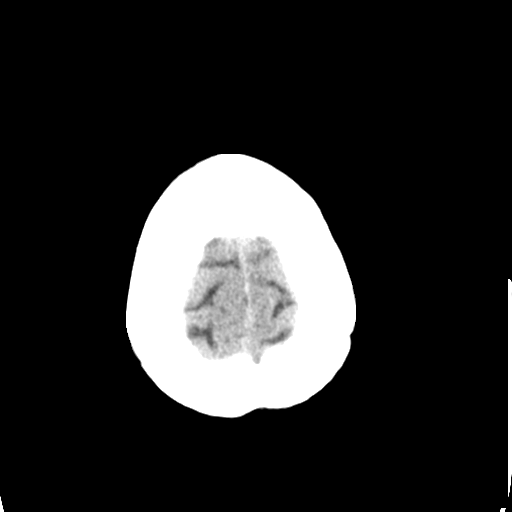

[15 of 30 positions shown; findings below may reference images not displayed]

FINDINGS: Skull and Sinuses:Stable appearance of right frontal craniotomy

Unchanged opacification of the left middle ear and mastoid cavities.

Orbits: No acute abnormality.

Brain: Continued clearance of subarachnoid hemorrhage. There is
decreasing intraventricular hemorrhage with small volume still
visible in the left occipital horn. No new hemorrhage is identified.
Mild fluid accumulation deep to the bone flap is unchanged. There
may be a tiny hygroma around the left frontal convexity as well,
without mass effect. Subacute infarcts in the bilateral ACA
territory, most confluent in the inferior bifrontal region and
corpus callosum genu. These are less visible due to fogging. An
infarct involving the right caudate head and anterior internal
capsule has also increased in density. There is no evidence of new
infarct or hemorrhagic conversion. Grossly stable orientation of
aneurysm clips near the right anterior communicating artery. There
is mild ventriculomegaly which is stable from previous.
IMPRESSION: 1. No acute intracranial findings.
2. Clearing intracranial hemorrhage with minimal intraventricular
blood persistent.
3. Evolution of multi focal brain infarct, as above.
4. Stable mild ventriculomegaly.

## 2015-07-18 LAB — GLUCOSE, CAPILLARY: GLUCOSE-CAPILLARY: 94 mg/dL (ref 65–99)

## 2015-07-21 LAB — GLUCOSE, CAPILLARY: Glucose-Capillary: 91 mg/dL (ref 65–99)

## 2015-07-22 LAB — GLUCOSE, CAPILLARY: Glucose-Capillary: 67 mg/dL (ref 65–99)

## 2015-07-24 IMAGING — CR DG ABD PORTABLE 1V
2 series · 2 of 2 positions shown · non-contrast
Comparison: Abdominal radiograph performed 07/31/2014

CLINICAL DATA: Assess G-tube placement.  Initial encounter.

EXAM:
PORTABLE ABDOMEN - 1 VIEW

[AP (1 of 2)]
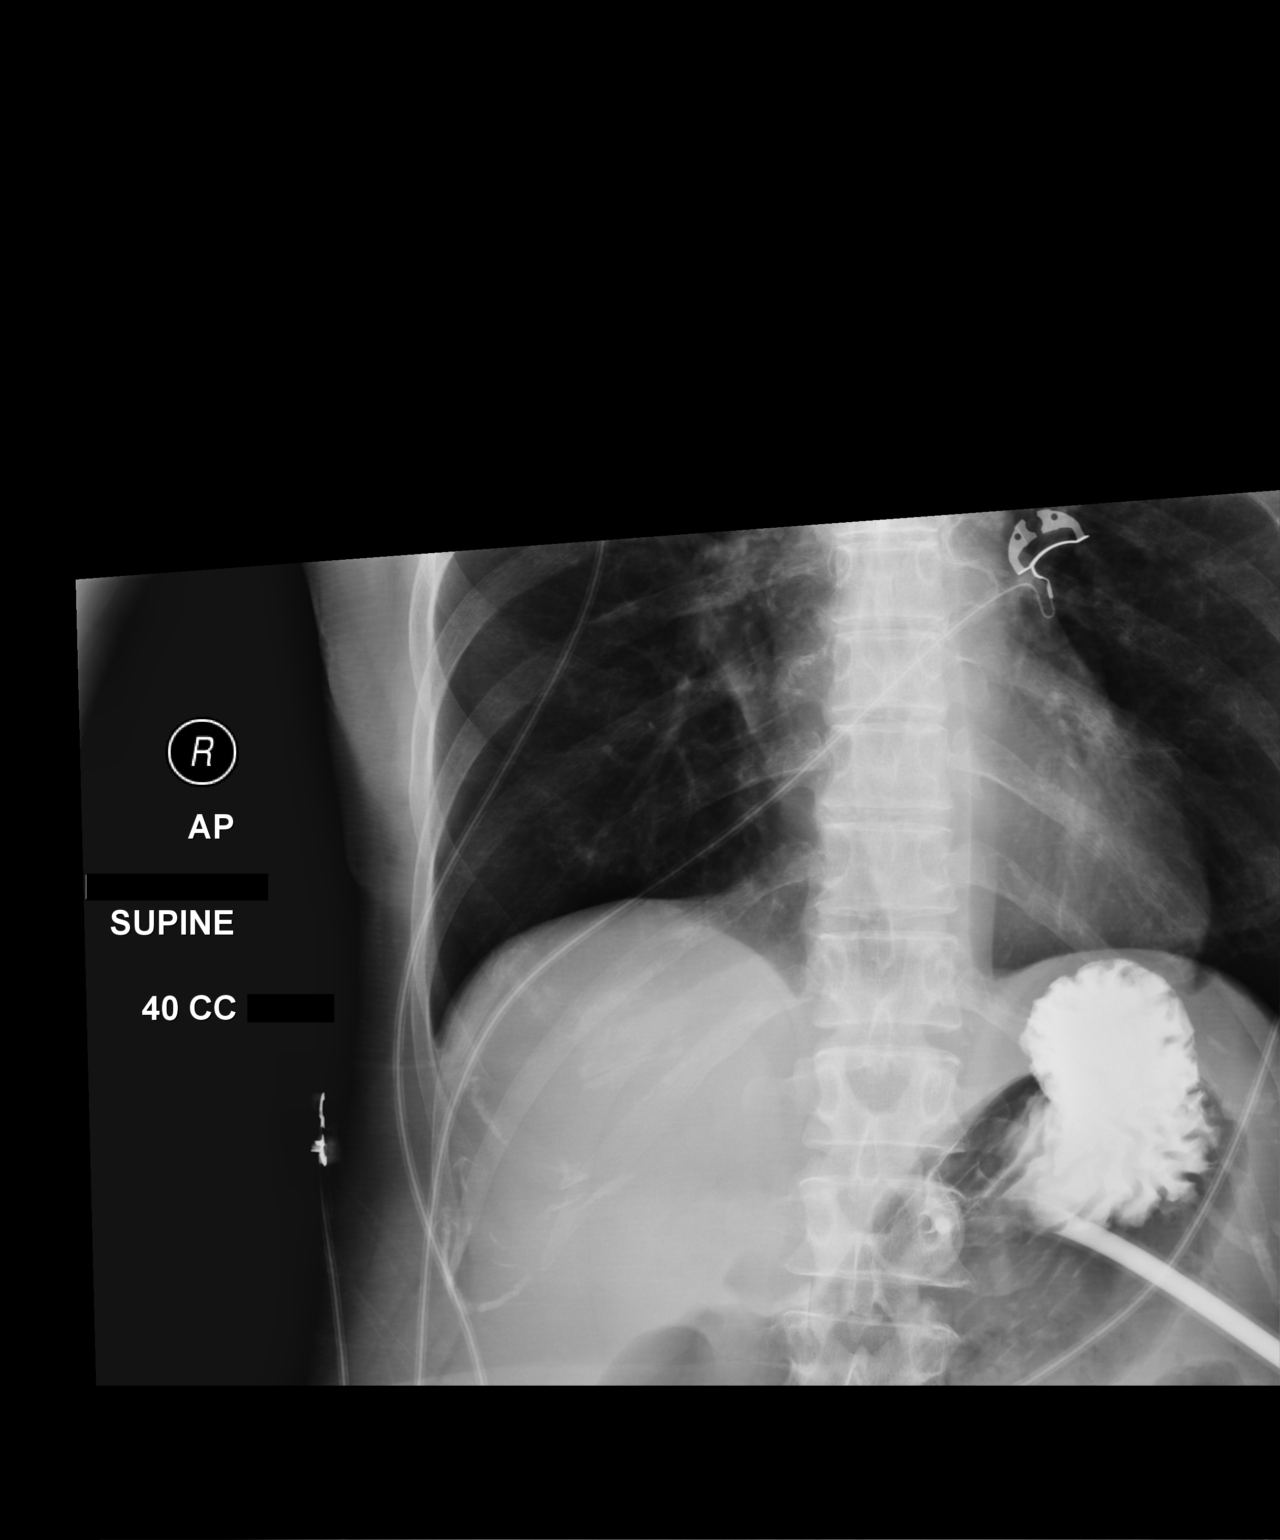

[AP (2 of 2)]
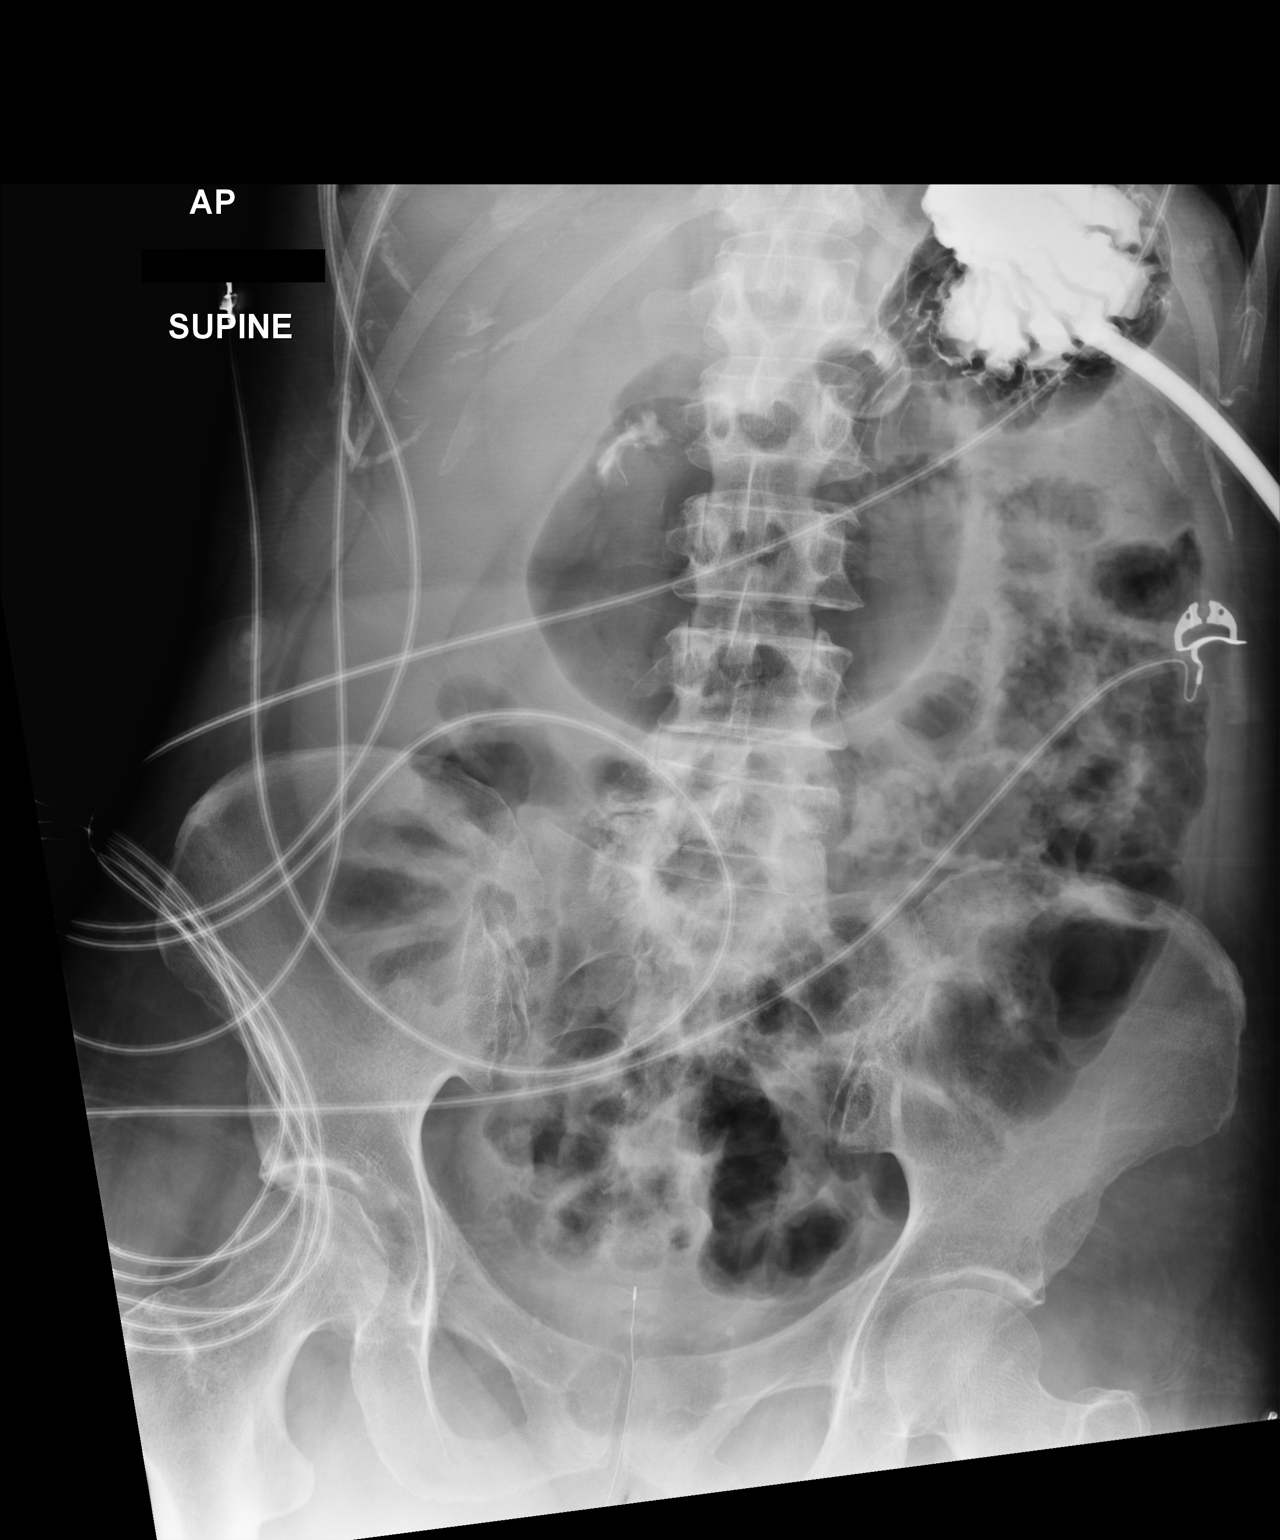

[2 of 2 positions shown; findings below may reference images not displayed]

FINDINGS: Injection of contrast within the patient's G-tube demonstrates
contrast accumulation at the fundus of the stomach. The stomach is
filled with air and grossly unremarkable. The visualized bowel gas
pattern is within normal limits. No free intra-abdominal air is
identified, though evaluation for free air is suboptimal on supine
views.

The visualized lung bases are grossly clear. Mild degenerative
change is noted at the lower lumbar spine.
IMPRESSION: G-tube noted ending at the body of the stomach, with injected
contrast accumulating at the fundus of the stomach.

## 2015-07-26 ENCOUNTER — Encounter
Admission: RE | Admit: 2015-07-26 | Discharge: 2015-07-26 | Disposition: A | Discharge status: Home / Self Care | Payer: Medicare Other | Source: Ambulatory Visit | Attending: Internal Medicine | Admitting: Internal Medicine

## 2015-07-26 ENCOUNTER — Encounter
Admission: RE | Admit: 2015-07-26 | Payer: Medicare Other | Source: Ambulatory Visit | Attending: Internal Medicine | Admitting: Internal Medicine

## 2015-07-30 LAB — GLUCOSE, CAPILLARY: GLUCOSE-CAPILLARY: 75 mg/dL (ref 65–99)

## 2015-07-31 LAB — GLUCOSE, CAPILLARY: Glucose-Capillary: 104 mg/dL — ABNORMAL HIGH (ref 65–99)

## 2015-08-01 LAB — GLUCOSE, CAPILLARY: GLUCOSE-CAPILLARY: 98 mg/dL (ref 65–99)

## 2015-08-04 LAB — GLUCOSE, CAPILLARY: Glucose-Capillary: 79 mg/dL (ref 65–99)

## 2015-08-05 LAB — GLUCOSE, CAPILLARY: GLUCOSE-CAPILLARY: 91 mg/dL (ref 65–99)

## 2015-08-09 LAB — GLUCOSE, CAPILLARY: Glucose-Capillary: 100 mg/dL — ABNORMAL HIGH (ref 65–99)

## 2015-08-10 LAB — GLUCOSE, CAPILLARY: GLUCOSE-CAPILLARY: 94 mg/dL (ref 65–99)

## 2015-08-13 LAB — GLUCOSE, CAPILLARY: GLUCOSE-CAPILLARY: 94 mg/dL (ref 65–99)

## 2015-08-14 LAB — GLUCOSE, CAPILLARY: Glucose-Capillary: 97 mg/dL (ref 65–99)

## 2015-08-15 LAB — GLUCOSE, CAPILLARY: Glucose-Capillary: 77 mg/dL (ref 65–99)

## 2015-08-18 LAB — GLUCOSE, CAPILLARY: Glucose-Capillary: 95 mg/dL (ref 65–99)

## 2015-08-19 LAB — GLUCOSE, CAPILLARY: Glucose-Capillary: 61 mg/dL — ABNORMAL LOW (ref 65–99)

## 2015-08-23 LAB — GLUCOSE, CAPILLARY: Glucose-Capillary: 77 mg/dL (ref 65–99)

## 2015-08-24 LAB — GLUCOSE, CAPILLARY: GLUCOSE-CAPILLARY: 95 mg/dL (ref 65–99)

## 2015-08-25 ENCOUNTER — Encounter
Admission: RE | Admit: 2015-08-25 | Discharge: 2015-08-25 | Disposition: A | Discharge status: Home / Self Care | Payer: Medicare Other | Source: Ambulatory Visit | Attending: Internal Medicine | Admitting: Internal Medicine

## 2015-08-25 DIAGNOSIS — R739 Hyperglycemia, unspecified: Secondary | ICD-10-CM | POA: Insufficient documentation

## 2015-09-16 DIAGNOSIS — R739 Hyperglycemia, unspecified: Secondary | ICD-10-CM | POA: Diagnosis not present

## 2015-09-16 LAB — GLUCOSE, CAPILLARY: Glucose-Capillary: 87 mg/dL (ref 65–99)

## 2015-09-20 DIAGNOSIS — R739 Hyperglycemia, unspecified: Secondary | ICD-10-CM | POA: Diagnosis not present

## 2015-09-20 LAB — GLUCOSE, CAPILLARY: Glucose-Capillary: 139 mg/dL — ABNORMAL HIGH (ref 65–99)

## 2015-09-21 DIAGNOSIS — R739 Hyperglycemia, unspecified: Secondary | ICD-10-CM | POA: Diagnosis not present

## 2015-09-21 LAB — GLUCOSE, CAPILLARY: GLUCOSE-CAPILLARY: 97 mg/dL (ref 65–99)

## 2015-09-24 DIAGNOSIS — R739 Hyperglycemia, unspecified: Secondary | ICD-10-CM | POA: Diagnosis not present

## 2015-09-24 LAB — GLUCOSE, CAPILLARY: Glucose-Capillary: 84 mg/dL (ref 65–99)

## 2015-09-25 ENCOUNTER — Encounter
Admission: RE | Admit: 2015-09-25 | Discharge: 2015-09-25 | Disposition: A | Discharge status: Home / Self Care | Payer: Medicare Other | Source: Ambulatory Visit | Attending: Internal Medicine | Admitting: Internal Medicine

## 2015-09-25 DIAGNOSIS — R739 Hyperglycemia, unspecified: Secondary | ICD-10-CM | POA: Insufficient documentation

## 2015-09-26 LAB — GLUCOSE, CAPILLARY: Glucose-Capillary: 89 mg/dL (ref 65–99)

## 2015-09-29 LAB — GLUCOSE, CAPILLARY: Glucose-Capillary: 115 mg/dL — ABNORMAL HIGH (ref 65–99)

## 2015-10-04 LAB — GLUCOSE, CAPILLARY: GLUCOSE-CAPILLARY: 86 mg/dL (ref 65–99)

## 2015-10-05 LAB — GLUCOSE, CAPILLARY: Glucose-Capillary: 95 mg/dL (ref 65–99)

## 2015-10-10 LAB — GLUCOSE, CAPILLARY
Glucose-Capillary: 120 mg/dL — ABNORMAL HIGH (ref 65–99)
Glucose-Capillary: 121 mg/dL — ABNORMAL HIGH (ref 65–99)

## 2015-10-12 LAB — GLUCOSE, CAPILLARY
GLUCOSE-CAPILLARY: 67 mg/dL (ref 65–99)
Glucose-Capillary: 67 mg/dL (ref 65–99)

## 2015-10-13 LAB — GLUCOSE, CAPILLARY: Glucose-Capillary: 99 mg/dL (ref 65–99)

## 2015-10-14 LAB — GLUCOSE, CAPILLARY: GLUCOSE-CAPILLARY: 110 mg/dL — AB (ref 65–99)

## 2015-10-15 LAB — GLUCOSE, CAPILLARY
Glucose-Capillary: 137 mg/dL — ABNORMAL HIGH (ref 65–99)
Glucose-Capillary: 61 mg/dL — ABNORMAL LOW (ref 65–99)

## 2015-10-16 LAB — GLUCOSE, CAPILLARY: Glucose-Capillary: 70 mg/dL (ref 65–99)

## 2015-10-17 LAB — GLUCOSE, CAPILLARY: Glucose-Capillary: 71 mg/dL (ref 65–99)

## 2015-10-18 LAB — GLUCOSE, CAPILLARY: Glucose-Capillary: 98 mg/dL (ref 65–99)

## 2015-10-19 LAB — GLUCOSE, CAPILLARY: Glucose-Capillary: 94 mg/dL (ref 65–99)

## 2015-10-20 LAB — GLUCOSE, CAPILLARY: Glucose-Capillary: 69 mg/dL (ref 65–99)

## 2015-10-21 LAB — GLUCOSE, CAPILLARY: GLUCOSE-CAPILLARY: 78 mg/dL (ref 65–99)

## 2015-10-22 LAB — GLUCOSE, CAPILLARY: Glucose-Capillary: 107 mg/dL — ABNORMAL HIGH (ref 65–99)

## 2015-10-23 LAB — GLUCOSE, CAPILLARY: GLUCOSE-CAPILLARY: 85 mg/dL (ref 65–99)

## 2015-10-24 LAB — GLUCOSE, CAPILLARY: Glucose-Capillary: 63 mg/dL — ABNORMAL LOW (ref 65–99)

## 2015-10-25 LAB — GLUCOSE, CAPILLARY: Glucose-Capillary: 75 mg/dL (ref 65–99)

## 2015-10-26 ENCOUNTER — Encounter
Admission: RE | Admit: 2015-10-26 | Discharge: 2015-10-26 | Disposition: A | Discharge status: Home / Self Care | Payer: Medicare Other | Source: Ambulatory Visit | Attending: Internal Medicine | Admitting: Internal Medicine

## 2015-10-28 LAB — GLUCOSE, CAPILLARY: GLUCOSE-CAPILLARY: 73 mg/dL (ref 65–99)

## 2015-10-29 LAB — GLUCOSE, CAPILLARY: GLUCOSE-CAPILLARY: 55 mg/dL — AB (ref 65–99)

## 2015-10-30 LAB — GLUCOSE, CAPILLARY: GLUCOSE-CAPILLARY: 58 mg/dL — AB (ref 65–99)

## 2015-10-31 LAB — GLUCOSE, CAPILLARY: GLUCOSE-CAPILLARY: 60 mg/dL — AB (ref 65–99)

## 2015-11-01 LAB — GLUCOSE, CAPILLARY: Glucose-Capillary: 83 mg/dL (ref 65–99)

## 2015-11-02 LAB — GLUCOSE, CAPILLARY: GLUCOSE-CAPILLARY: 104 mg/dL — AB (ref 65–99)

## 2015-11-06 LAB — GLUCOSE, CAPILLARY: Glucose-Capillary: 88 mg/dL (ref 65–99)

## 2015-11-09 LAB — GLUCOSE, CAPILLARY: Glucose-Capillary: 69 mg/dL (ref 65–99)

## 2015-11-16 LAB — GLUCOSE, CAPILLARY: Glucose-Capillary: 85 mg/dL (ref 65–99)

## 2015-11-23 ENCOUNTER — Encounter
Admission: RE | Admit: 2015-11-23 | Discharge: 2015-11-23 | Disposition: A | Discharge status: Home / Self Care | Payer: Medicare Other | Source: Ambulatory Visit | Attending: Internal Medicine | Admitting: Internal Medicine

## 2015-11-23 LAB — GLUCOSE, CAPILLARY: Glucose-Capillary: 63 mg/dL — ABNORMAL LOW (ref 65–99)

## 2015-12-19 LAB — GLUCOSE, CAPILLARY: Glucose-Capillary: 92 mg/dL (ref 65–99)

## 2015-12-21 LAB — GLUCOSE, CAPILLARY: Glucose-Capillary: 56 mg/dL — ABNORMAL LOW (ref 65–99)

## 2015-12-24 ENCOUNTER — Encounter
Admission: RE | Admit: 2015-12-24 | Discharge: 2015-12-24 | Disposition: A | Discharge status: Home / Self Care | Payer: Medicare Other | Source: Ambulatory Visit | Attending: Internal Medicine | Admitting: Internal Medicine

## 2015-12-28 LAB — GLUCOSE, CAPILLARY: GLUCOSE-CAPILLARY: 89 mg/dL (ref 65–99)

## 2016-01-04 LAB — GLUCOSE, CAPILLARY: Glucose-Capillary: 66 mg/dL (ref 65–99)

## 2016-01-05 LAB — GLUCOSE, CAPILLARY: Glucose-Capillary: 123 mg/dL — ABNORMAL HIGH (ref 65–99)

## 2016-01-06 LAB — GLUCOSE, CAPILLARY
GLUCOSE-CAPILLARY: 52 mg/dL — AB (ref 65–99)
Glucose-Capillary: 114 mg/dL — ABNORMAL HIGH (ref 65–99)
Glucose-Capillary: 122 mg/dL — ABNORMAL HIGH (ref 65–99)

## 2016-01-07 LAB — GLUCOSE, CAPILLARY
GLUCOSE-CAPILLARY: 241 mg/dL — AB (ref 65–99)
GLUCOSE-CAPILLARY: 171 mg/dL — AB (ref 65–99)
Glucose-Capillary: 66 mg/dL (ref 65–99)

## 2016-01-08 LAB — GLUCOSE, CAPILLARY
Glucose-Capillary: 84 mg/dL (ref 65–99)
Glucose-Capillary: 139 mg/dL — ABNORMAL HIGH (ref 65–99)

## 2016-01-09 LAB — GLUCOSE, CAPILLARY
GLUCOSE-CAPILLARY: 93 mg/dL (ref 65–99)
Glucose-Capillary: 63 mg/dL — ABNORMAL LOW (ref 65–99)

## 2016-01-10 LAB — GLUCOSE, CAPILLARY
GLUCOSE-CAPILLARY: 106 mg/dL — AB (ref 65–99)
Glucose-Capillary: 91 mg/dL (ref 65–99)

## 2016-01-11 LAB — GLUCOSE, CAPILLARY
Glucose-Capillary: 111 mg/dL — ABNORMAL HIGH (ref 65–99)
Glucose-Capillary: 117 mg/dL — ABNORMAL HIGH (ref 65–99)

## 2016-01-12 LAB — GLUCOSE, CAPILLARY
GLUCOSE-CAPILLARY: 62 mg/dL — AB (ref 65–99)
GLUCOSE-CAPILLARY: 111 mg/dL — AB (ref 65–99)

## 2016-01-13 LAB — GLUCOSE, CAPILLARY: GLUCOSE-CAPILLARY: 86 mg/dL (ref 65–99)

## 2016-01-14 LAB — GLUCOSE, CAPILLARY
GLUCOSE-CAPILLARY: 105 mg/dL — AB (ref 65–99)
Glucose-Capillary: 81 mg/dL (ref 65–99)

## 2016-01-15 LAB — GLUCOSE, CAPILLARY
Glucose-Capillary: 96 mg/dL (ref 65–99)
Glucose-Capillary: 119 mg/dL — ABNORMAL HIGH (ref 65–99)

## 2016-01-16 LAB — GLUCOSE, CAPILLARY
GLUCOSE-CAPILLARY: 98 mg/dL (ref 65–99)
Glucose-Capillary: 124 mg/dL — ABNORMAL HIGH (ref 65–99)

## 2016-01-17 LAB — GLUCOSE, CAPILLARY
GLUCOSE-CAPILLARY: 173 mg/dL — AB (ref 65–99)
Glucose-Capillary: 58 mg/dL — ABNORMAL LOW (ref 65–99)

## 2016-01-18 LAB — GLUCOSE, CAPILLARY
Glucose-Capillary: 72 mg/dL (ref 65–99)
Glucose-Capillary: 111 mg/dL — ABNORMAL HIGH (ref 65–99)

## 2016-01-19 LAB — GLUCOSE, CAPILLARY
GLUCOSE-CAPILLARY: 188 mg/dL — AB (ref 65–99)
Glucose-Capillary: 69 mg/dL (ref 65–99)

## 2016-01-20 LAB — GLUCOSE, CAPILLARY
GLUCOSE-CAPILLARY: 103 mg/dL — AB (ref 65–99)
GLUCOSE-CAPILLARY: 104 mg/dL — AB (ref 65–99)

## 2016-01-21 LAB — GLUCOSE, CAPILLARY
GLUCOSE-CAPILLARY: 184 mg/dL — AB (ref 65–99)
GLUCOSE-CAPILLARY: 128 mg/dL — AB (ref 65–99)
Glucose-Capillary: 60 mg/dL — ABNORMAL LOW (ref 65–99)

## 2016-01-22 LAB — GLUCOSE, CAPILLARY
GLUCOSE-CAPILLARY: 80 mg/dL (ref 65–99)
GLUCOSE-CAPILLARY: 148 mg/dL — AB (ref 65–99)

## 2016-01-23 ENCOUNTER — Encounter
Admission: RE | Admit: 2016-01-23 | Discharge: 2016-01-23 | Disposition: A | Discharge status: Home / Self Care | Payer: Medicare Other | Source: Ambulatory Visit | Attending: Internal Medicine | Admitting: Internal Medicine

## 2016-01-23 LAB — GLUCOSE, CAPILLARY
GLUCOSE-CAPILLARY: 79 mg/dL (ref 65–99)
Glucose-Capillary: 89 mg/dL (ref 65–99)

## 2016-02-15 LAB — GLUCOSE, CAPILLARY: Glucose-Capillary: 132 mg/dL — ABNORMAL HIGH (ref 65–99)

## 2016-02-22 LAB — GLUCOSE, CAPILLARY: Glucose-Capillary: 91 mg/dL (ref 65–99)

## 2016-02-23 ENCOUNTER — Encounter
Admission: RE | Admit: 2016-02-23 | Discharge: 2016-02-23 | Disposition: A | Discharge status: Home / Self Care | Payer: Medicare Other | Source: Ambulatory Visit | Attending: Internal Medicine | Admitting: Internal Medicine

## 2016-02-23 DIAGNOSIS — J449 Chronic obstructive pulmonary disease, unspecified: Secondary | ICD-10-CM | POA: Insufficient documentation

## 2016-02-29 LAB — GLUCOSE, CAPILLARY: GLUCOSE-CAPILLARY: 168 mg/dL — AB (ref 65–99)

## 2016-03-07 LAB — GLUCOSE, CAPILLARY: Glucose-Capillary: 121 mg/dL — ABNORMAL HIGH (ref 65–99)

## 2016-03-13 DIAGNOSIS — J449 Chronic obstructive pulmonary disease, unspecified: Secondary | ICD-10-CM | POA: Diagnosis present

## 2016-03-13 LAB — COMPREHENSIVE METABOLIC PANEL
ALT: 52 U/L (ref 14–54)
ANION GAP: 7 (ref 5–15)
AST: 23 U/L (ref 15–41)
Albumin: 2.7 g/dL — ABNORMAL LOW (ref 3.5–5.0)
Alkaline Phosphatase: 57 U/L (ref 38–126)
BUN: 17 mg/dL (ref 6–20)
CHLORIDE: 83 mmol/L — AB (ref 101–111)
CO2: 34 mmol/L — AB (ref 22–32)
Calcium: 8.4 mg/dL — ABNORMAL LOW (ref 8.9–10.3)
Creatinine, Ser: 0.58 mg/dL (ref 0.44–1.00)
GFR calc non Af Amer: >60 mL/min (ref >60)
Glucose, Bld: 210 mg/dL — ABNORMAL HIGH (ref 65–99)
POTASSIUM: 4.6 mmol/L (ref 3.5–5.1)
SODIUM: 124 mmol/L — AB (ref 135–145)
Total Bilirubin: 0.2 mg/dL — ABNORMAL LOW (ref 0.3–1.2)
Total Protein: 5.9 g/dL — ABNORMAL LOW (ref 6.5–8.1)

## 2016-03-13 LAB — CBC WITH DIFFERENTIAL/PLATELET
BASOS ABS: 0 10*3/uL (ref 0–0.1)
BLASTS: 0 %
Band Neutrophils: 0 %
Basophils Relative: 0 %
Eosinophils Absolute: 0 10*3/uL (ref 0–0.7)
Eosinophils Relative: 0 %
HEMATOCRIT: 34.8 % — AB (ref 35.0–47.0)
HEMOGLOBIN: 11.4 g/dL — AB (ref 12.0–16.0)
LYMPHS ABS: 0.8 10*3/uL — AB (ref 1.0–3.6)
Lymphocytes Relative: 5 %
MCH: 27.8 pg (ref 26.0–34.0)
MCHC: 32.9 g/dL (ref 32.0–36.0)
MCV: 84.6 fL (ref 80.0–100.0)
METAMYELOCYTES PCT: 0 %
MYELOCYTES: 0 %
Monocytes Absolute: 2.5 10*3/uL — ABNORMAL HIGH (ref 0.2–0.9)
Monocytes Relative: 15 %
NEUTROS PCT: 80 %
NRBC: 0 /100{WBCs}
Neutro Abs: 13.5 10*3/uL — ABNORMAL HIGH (ref 1.4–6.5)
Other: 0 %
PROMYELOCYTES ABS: 0 %
Platelets: 149 10*3/uL — ABNORMAL LOW (ref 150–440)
RBC: 4.11 MIL/uL (ref 3.80–5.20)
RDW: 14.5 % (ref 11.5–14.5)
WBC: 16.8 10*3/uL — AB (ref 3.6–11.0)

## 2016-03-13 LAB — C DIFFICILE QUICK SCREEN W PCR REFLEX
C DIFFICILE (CDIFF) INTERP: POSITIVE
C DIFFICILE (CDIFF) TOXIN: POSITIVE — AB
C Diff antigen: POSITIVE — AB

## 2016-03-14 LAB — GLUCOSE, CAPILLARY: GLUCOSE-CAPILLARY: 101 mg/dL — AB (ref 65–99)

## 2016-03-15 DIAGNOSIS — J449 Chronic obstructive pulmonary disease, unspecified: Secondary | ICD-10-CM | POA: Diagnosis not present

## 2016-03-15 LAB — CBC WITH DIFFERENTIAL/PLATELET
BASOS ABS: 0 10*3/uL (ref 0–0.1)
BLASTS: 0 %
Band Neutrophils: 0 %
Basophils Relative: 0 %
EOS PCT: 0 %
Eosinophils Absolute: 0 10*3/uL (ref 0–0.7)
HCT: 33.2 % — ABNORMAL LOW (ref 35.0–47.0)
HEMOGLOBIN: 11.1 g/dL — AB (ref 12.0–16.0)
LYMPHS ABS: 1.1 10*3/uL (ref 1.0–3.6)
Lymphocytes Relative: 10 %
MCH: 27.9 pg (ref 26.0–34.0)
MCHC: 33.4 g/dL (ref 32.0–36.0)
MCV: 83.7 fL (ref 80.0–100.0)
MYELOCYTES: 0 %
Metamyelocytes Relative: 0 %
Monocytes Absolute: 2.6 10*3/uL — ABNORMAL HIGH (ref 0.2–0.9)
Monocytes Relative: 24 %
NEUTROS PCT: 66 %
NRBC: 0 /100{WBCs}
Neutro Abs: 7 10*3/uL — ABNORMAL HIGH (ref 1.4–6.5)
Other: 0 %
PROMYELOCYTES ABS: 0 %
Platelets: 193 10*3/uL (ref 150–440)
RBC: 3.97 MIL/uL (ref 3.80–5.20)
RDW: 14.3 % (ref 11.5–14.5)
WBC: 10.7 10*3/uL (ref 3.6–11.0)

## 2016-03-15 LAB — BASIC METABOLIC PANEL
ANION GAP: 9 (ref 5–15)
BUN: 15 mg/dL (ref 6–20)
CHLORIDE: 87 mmol/L — AB (ref 101–111)
CO2: 29 mmol/L (ref 22–32)
Calcium: 8 mg/dL — ABNORMAL LOW (ref 8.9–10.3)
Creatinine, Ser: 0.51 mg/dL (ref 0.44–1.00)
GFR calc Af Amer: >60 mL/min (ref >60)
GFR calc non Af Amer: >60 mL/min (ref >60)
GLUCOSE: 238 mg/dL — AB (ref 65–99)
POTASSIUM: 4 mmol/L (ref 3.5–5.1)
Sodium: 125 mmol/L — ABNORMAL LOW (ref 135–145)

## 2016-03-17 LAB — CBC WITH DIFFERENTIAL/PLATELET
BAND NEUTROPHILS: 13 %
BASOS ABS: 0 10*3/uL (ref 0–0.1)
Basophils Relative: 0 %
Blasts: 0 %
EOS ABS: 0 10*3/uL (ref 0–0.7)
EOS PCT: 0 %
HCT: 31.9 % — ABNORMAL LOW (ref 35.0–47.0)
Hemoglobin: 10.6 g/dL — ABNORMAL LOW (ref 12.0–16.0)
LYMPHS ABS: 0.5 10*3/uL — AB (ref 1.0–3.6)
Lymphocytes Relative: 3 %
MCH: 27.8 pg (ref 26.0–34.0)
MCHC: 33.1 g/dL (ref 32.0–36.0)
MCV: 84.1 fL (ref 80.0–100.0)
METAMYELOCYTES PCT: 0 %
MONO ABS: 3.4 10*3/uL — AB (ref 0.2–0.9)
MONOS PCT: 20 %
MYELOCYTES: 0 %
NEUTROS ABS: 12.9 10*3/uL — AB (ref 1.4–6.5)
Neutrophils Relative %: 64 %
Other: 0 %
PLATELETS: 241 10*3/uL (ref 150–440)
Promyelocytes Absolute: 0 %
RBC: 3.8 MIL/uL (ref 3.80–5.20)
RDW: 14.3 % (ref 11.5–14.5)
WBC: 16.8 10*3/uL — AB (ref 3.6–11.0)
nRBC: 0 /100 WBC

## 2016-03-17 LAB — BASIC METABOLIC PANEL
ANION GAP: 6 (ref 5–15)
BUN: 14 mg/dL (ref 6–20)
CALCIUM: 8.1 mg/dL — AB (ref 8.9–10.3)
CO2: 34 mmol/L — ABNORMAL HIGH (ref 22–32)
Chloride: 87 mmol/L — ABNORMAL LOW (ref 101–111)
Creatinine, Ser: 0.64 mg/dL (ref 0.44–1.00)
GFR calc non Af Amer: >60 mL/min (ref >60)
GLUCOSE: 344 mg/dL — AB (ref 65–99)
POTASSIUM: 4.5 mmol/L (ref 3.5–5.1)
SODIUM: 127 mmol/L — AB (ref 135–145)

## 2016-03-19 ENCOUNTER — Non-Acute Institutional Stay (SKILLED_NURSING_FACILITY): Payer: Medicare Other | Admitting: Gerontology

## 2016-03-19 DIAGNOSIS — J449 Chronic obstructive pulmonary disease, unspecified: Secondary | ICD-10-CM | POA: Diagnosis not present

## 2016-03-19 NOTE — Progress Notes (Signed)
Location:  [facility] Service:  SNF [phone]) Provider:  Toni Arthurs, NP-C  No PCP Per Patient  Patient Care Team: No Pcp Per Patient as PCP - General (General Practice)  Extended Emergency Contact Information Primary Emergency Contact: Julianne Rice Address: [address]          [place], [place] [address] Johnnette Litter of [place] Phone: [phone] Mobile Phone: [phone] Relation: Sister  Code Status:  DNR Goals of care: Advanced Directive information No flowsheet data found.   Chief Complaint  Patient presents with  . Shortness of Breath    HPI:  Pt is a 65 y.o. female seen today for an acute visit for shortness of breath. Over the weekend, nursing reported tachypnea, low sats, tachycardia, lung sounds with crackles and wheezing. Sx improved after Lasix 40 mg IM x 2 and Solumedrol 125 mg IM x 1. Today, symptoms are starting to return. Pt c/o "trouble breathing." Denies chest pain, abdominal pain, productive cough. C/o mild abdominal pain but currently has dx of C-Diff. Res is confused at times so difficult to obtain accurate subjective.    Past Medical History  Diagnosis Date  . COPD (chronic obstructive pulmonary disease)    Past Surgical History  Procedure Laterality Date  . Craniotomy Right 07/27/2014    Procedure: CRANIOTOMY INTRACRANIAL ANEURYSM FOR Pterional Aneurysm;  Surgeon: Consuella Lose, MD;  Location: [facility];  Service: Neurosurgery;  Laterality: Right;  . Esophagogastroduodenoscopy (egd) with propofol N/A 08/05/2014    Procedure: ESOPHAGOGASTRODUODENOSCOPY (EGD) WITH PROPOFOL;  Surgeon: Georganna Skeans, MD;  Location: [facility];  Service: General;  Laterality: N/A;  . Peg placement N/A 08/05/2014    Procedure: PERCUTANEOUS ENDOSCOPIC GASTROSTOMY (PEG) PLACEMENT;  Surgeon: Georganna Skeans, MD;  Location: [facility] END[facility]COPY;  Service: General;  Laterality: N/A;  bedside/tech only    Allergies  Allergen Reactions  .  Vancomycin     Pt had red rash all over body after vanc was given, this was told to me in report but had not been added to the allergy list       Medication List    Notice  As of 03/19/2016 10:54 PM   You have not been prescribed any medications.      Review of Systems  Constitutional: Positive for fever and activity change.  HENT: Negative.   Eyes: Negative.   Respiratory: Positive for cough, shortness of breath and wheezing. Negative for chest tightness and stridor.   Cardiovascular: Negative for chest pain and leg swelling.  Gastrointestinal: Positive for abdominal pain and abdominal distention.  Genitourinary: Negative.   Musculoskeletal: Negative.   Skin: Positive for rash.  Neurological: Negative.   Psychiatric/Behavioral: Negative.     Immunization History  Administered Date(s) Administered  . Pneumococcal Polysaccharide-23 07/30/2014   There are no preventive care reminders to display for this patient. No flowsheet data found. Functional Status Survey:    There were no vitals filed for this visit. There is no weight on file to calculate BMI. Physical Exam  Constitutional: She appears well-developed and well-nourished. She is cooperative. She appears ill. She appears distressed.  Eyes: Pupils are equal, round, and reactive to light.  Neck: Neck supple. No JVD present. No thyromegaly present.  Cardiovascular: Regular rhythm, normal heart sounds and normal pulses.  Tachycardia present.  Exam reveals no gallop and no friction rub.   No murmur heard. Pulmonary/Chest: No accessory muscle usage or stridor. She is in respiratory distress. She has decreased breath  sounds (all fields, "tight" sounding). She has wheezes (all fields).  Abdominal: Soft. Bowel sounds are normal. There is no splenomegaly or hepatomegaly. There is generalized tenderness. There is guarding.  PEG tube in place  Musculoskeletal: Normal range of motion.  Lymphadenopathy:    She has no cervical  adenopathy.  Neurological: She is alert.  Skin: Skin is warm and dry. Rash (B-upper arms- resolving. Thought to be from Augmentin ordered 2 weeks ago) noted. No cyanosis. Nails show no clubbing.    Labs reviewed:  Recent Labs  06/23/15 0812 03/13/16 1228 03/15/16 0839 03/17/16 0910  NA 132* 124* 125* 127*  K 4.2 4.6 4.0 4.5  CL 89* 83* 87* 87*  CO2 34* 34* 29 34*  GLUCOSE 213* 210* 238* 344*  BUN 21* 17 15 14   CREATININE 0.79 0.58 0.51 0.64  CALCIUM 9.2 8.4* 8.0* 8.1*  MG 1.7  --   --   --     Recent Labs  06/23/15 0812 03/13/16 1228  AST 29 23  ALT 29 52  ALKPHOS 62 57  BILITOT 0.6 0.2*  PROT 6.7 5.9*  ALBUMIN 3.2* 2.7*    Recent Labs  03/13/16 1228 03/15/16 0839 03/17/16 0910  WBC 16.8* 10.7 16.8*  NEUTROABS 13.5* 7.0* 12.9*  HGB 11.4* 11.1* 10.6*  HCT 34.8* 33.2* 31.9*  MCV 84.6 83.7 84.1  PLT 149* 193 241   Lab Results  Component Value Date   TSH 2.440 06/23/2015   Lab Results  Component Value Date   HGBA1C 6.0 06/23/2015   Lab Results  Component Value Date   CHOL 165 06/23/2015   HDL 64 06/23/2015   LDLCALC 85 06/23/2015   TRIG 81 06/23/2015   CHOLHDL 2.6 06/23/2015    Significant Diagnostic Results in last 30 days:  No results found.  Assessment/Plan 1. COPD, moderate (HCC)  Begin 0.9% NS at 50 ml/ hr x 48 hours for hydration, hyponatremia  Solumedrol 125 mg IM x 1 now, repeat in 12 hours  Linezolid 600 mg IV Q 12 hours x 7 days  Hold Combivent x 2 days  Scheduled TID Duonebs x 2 days  Xopenex QID  2VCXR, KUB  DNR- per family request- relayed to nurse yesterday  Hold Sertraline x 8 days- until after Linezolid complete  Morphine concentrate 20 mg/ ml- 0.25-0.5 mL po Q 1 hr prn for dyspnea, pain  Family/ staff Communication:   Total Time: 35 minutes  Documentation: 20 minutes  Face to Face: 15 minutes  Family/Phone:   Labs/tests ordered:  Cbc, met C, 2vCXR, [place], NP-C [facility] [address], [place] [address] Cell Phone (Mon-Fri 8am-5pm):  [phone] On Call:  [phone] & follow prompts after 5pm & weekends Office Phone:  [phone] Office Fax:  [phone]

## 2016-03-20 ENCOUNTER — Other Ambulatory Visit: Payer: Self-pay

## 2016-03-20 ENCOUNTER — Encounter: Payer: Self-pay | Admitting: Emergency Medicine

## 2016-03-20 ENCOUNTER — Emergency Department: Payer: Medicare Other

## 2016-03-20 ENCOUNTER — Inpatient Hospital Stay
Admission: EM | Admit: 2016-03-20 | Discharge: 2016-03-29 | DRG: 871 | Disposition: A | Discharge status: Hospice | Payer: Medicare Other | Attending: Internal Medicine | Admitting: Internal Medicine

## 2016-03-20 ENCOUNTER — Inpatient Hospital Stay: Payer: Medicare Other

## 2016-03-20 DIAGNOSIS — A047 Enterocolitis due to Clostridium difficile: Secondary | ICD-10-CM

## 2016-03-20 DIAGNOSIS — Y95 Nosocomial condition: Secondary | ICD-10-CM | POA: Diagnosis present

## 2016-03-20 DIAGNOSIS — R6521 Severe sepsis with septic shock: Secondary | ICD-10-CM | POA: Diagnosis present

## 2016-03-20 DIAGNOSIS — J9620 Acute and chronic respiratory failure, unspecified whether with hypoxia or hypercapnia: Secondary | ICD-10-CM | POA: Diagnosis present

## 2016-03-20 DIAGNOSIS — J189 Pneumonia, unspecified organism: Secondary | ICD-10-CM | POA: Diagnosis present

## 2016-03-20 DIAGNOSIS — A414 Sepsis due to anaerobes: Principal | ICD-10-CM | POA: Diagnosis present

## 2016-03-20 DIAGNOSIS — R739 Hyperglycemia, unspecified: Secondary | ICD-10-CM | POA: Insufficient documentation

## 2016-03-20 DIAGNOSIS — E785 Hyperlipidemia, unspecified: Secondary | ICD-10-CM | POA: Diagnosis present

## 2016-03-20 DIAGNOSIS — R5381 Other malaise: Secondary | ICD-10-CM | POA: Diagnosis present

## 2016-03-20 DIAGNOSIS — A419 Sepsis, unspecified organism: Secondary | ICD-10-CM | POA: Diagnosis not present

## 2016-03-20 DIAGNOSIS — I10 Essential (primary) hypertension: Secondary | ICD-10-CM | POA: Diagnosis present

## 2016-03-20 DIAGNOSIS — Z79899 Other long term (current) drug therapy: Secondary | ICD-10-CM

## 2016-03-20 DIAGNOSIS — R627 Adult failure to thrive: Secondary | ICD-10-CM | POA: Diagnosis not present

## 2016-03-20 DIAGNOSIS — G9341 Metabolic encephalopathy: Secondary | ICD-10-CM | POA: Diagnosis present

## 2016-03-20 DIAGNOSIS — R0602 Shortness of breath: Secondary | ICD-10-CM

## 2016-03-20 DIAGNOSIS — Z515 Encounter for palliative care: Secondary | ICD-10-CM | POA: Diagnosis present

## 2016-03-20 DIAGNOSIS — J440 Chronic obstructive pulmonary disease with acute lower respiratory infection: Secondary | ICD-10-CM | POA: Diagnosis present

## 2016-03-20 DIAGNOSIS — Z66 Do not resuscitate: Secondary | ICD-10-CM | POA: Diagnosis present

## 2016-03-20 DIAGNOSIS — E8809 Other disorders of plasma-protein metabolism, not elsewhere classified: Secondary | ICD-10-CM | POA: Diagnosis present

## 2016-03-20 DIAGNOSIS — A0472 Enterocolitis due to Clostridium difficile, not specified as recurrent: Secondary | ICD-10-CM | POA: Insufficient documentation

## 2016-03-20 DIAGNOSIS — E870 Hyperosmolality and hypernatremia: Secondary | ICD-10-CM | POA: Diagnosis present

## 2016-03-20 DIAGNOSIS — G40909 Epilepsy, unspecified, not intractable, without status epilepticus: Secondary | ICD-10-CM | POA: Diagnosis present

## 2016-03-20 DIAGNOSIS — Z931 Gastrostomy status: Secondary | ICD-10-CM | POA: Diagnosis not present

## 2016-03-20 DIAGNOSIS — R0600 Dyspnea, unspecified: Secondary | ICD-10-CM | POA: Diagnosis not present

## 2016-03-20 DIAGNOSIS — F325 Major depressive disorder, single episode, in full remission: Secondary | ICD-10-CM | POA: Diagnosis present

## 2016-03-20 DIAGNOSIS — E039 Hypothyroidism, unspecified: Secondary | ICD-10-CM | POA: Diagnosis present

## 2016-03-20 DIAGNOSIS — J9811 Atelectasis: Secondary | ICD-10-CM | POA: Diagnosis present

## 2016-03-20 DIAGNOSIS — J441 Chronic obstructive pulmonary disease with (acute) exacerbation: Secondary | ICD-10-CM | POA: Diagnosis present

## 2016-03-20 DIAGNOSIS — J209 Acute bronchitis, unspecified: Secondary | ICD-10-CM | POA: Diagnosis present

## 2016-03-20 DIAGNOSIS — E871 Hypo-osmolality and hyponatremia: Secondary | ICD-10-CM | POA: Diagnosis not present

## 2016-03-20 DIAGNOSIS — E1165 Type 2 diabetes mellitus with hyperglycemia: Secondary | ICD-10-CM | POA: Diagnosis present

## 2016-03-20 LAB — CBC WITH DIFFERENTIAL/PLATELET
BASOS ABS: 0 10*3/uL (ref 0–0.1)
BASOS ABS: 0 10*3/uL (ref 0–0.1)
Basophils Relative: 0 %
Basophils Relative: 0 %
EOS ABS: 0 10*3/uL (ref 0–0.7)
EOS PCT: 0 %
Eosinophils Absolute: 0 10*3/uL (ref 0–0.7)
Eosinophils Relative: 0 %
HCT: 35.1 % (ref 35.0–47.0)
HCT: 34.2 % — ABNORMAL LOW (ref 35.0–47.0)
HEMOGLOBIN: 11.2 g/dL — AB (ref 12.0–16.0)
HEMOGLOBIN: 11 g/dL — AB (ref 12.0–16.0)
LYMPHS PCT: 1 %
LYMPHS PCT: 2 %
Lymphs Abs: 0.6 10*3/uL — ABNORMAL LOW (ref 1.0–3.6)
Lymphs Abs: 1.1 10*3/uL (ref 1.0–3.6)
MCH: 27.6 pg (ref 26.0–34.0)
MCH: 27.5 pg (ref 26.0–34.0)
MCHC: 32 g/dL (ref 32.0–36.0)
MCHC: 32 g/dL (ref 32.0–36.0)
MCV: 86.3 fL (ref 80.0–100.0)
MCV: 85.8 fL (ref 80.0–100.0)
MONOS PCT: 4 %
Monocytes Absolute: 2.2 10*3/uL — ABNORMAL HIGH (ref 0.2–0.9)
Monocytes Absolute: 2.3 10*3/uL — ABNORMAL HIGH (ref 0.2–0.9)
Monocytes Relative: 4 %
NEUTROS ABS: 52.5 10*3/uL — AB (ref 1.4–6.5)
NEUTROS PCT: 94 %
Neutro Abs: 52.9 10*3/uL — ABNORMAL HIGH (ref 1.4–6.5)
Neutrophils Relative %: 95 %
Platelets: 308 10*3/uL (ref 150–440)
Platelets: 306 10*3/uL (ref 150–440)
RBC: 4.06 MIL/uL (ref 3.80–5.20)
RBC: 3.99 MIL/uL (ref 3.80–5.20)
RDW: 15.2 % — AB (ref 11.5–14.5)
RDW: 15.5 % — ABNORMAL HIGH (ref 11.5–14.5)
WBC: 55.3 10*3/uL — AB (ref 3.6–11.0)
WBC: 56.3 10*3/uL — AB (ref 3.6–11.0)

## 2016-03-20 LAB — COMPREHENSIVE METABOLIC PANEL
ALK PHOS: 116 U/L (ref 38–126)
ALT: 35 U/L (ref 14–54)
ALT: 31 U/L (ref 14–54)
ANION GAP: 7 (ref 5–15)
AST: 30 U/L (ref 15–41)
AST: 24 U/L (ref 15–41)
Albumin: 2.2 g/dL — ABNORMAL LOW (ref 3.5–5.0)
Albumin: 2.1 g/dL — ABNORMAL LOW (ref 3.5–5.0)
Alkaline Phosphatase: 131 U/L — ABNORMAL HIGH (ref 38–126)
Anion gap: 7 (ref 5–15)
BUN: 31 mg/dL — ABNORMAL HIGH (ref 6–20)
BUN: 28 mg/dL — AB (ref 6–20)
CALCIUM: 8 mg/dL — AB (ref 8.9–10.3)
CHLORIDE: 98 mmol/L — AB (ref 101–111)
CO2: 38 mmol/L — ABNORMAL HIGH (ref 22–32)
CO2: 35 mmol/L — AB (ref 22–32)
CREATININE: 0.94 mg/dL (ref 0.44–1.00)
CREATININE: 0.85 mg/dL (ref 0.44–1.00)
Calcium: 8.5 mg/dL — ABNORMAL LOW (ref 8.9–10.3)
Chloride: 104 mmol/L (ref 101–111)
Glucose, Bld: 558 mg/dL (ref 65–99)
Glucose, Bld: 398 mg/dL — ABNORMAL HIGH (ref 65–99)
POTASSIUM: 4.5 mmol/L (ref 3.5–5.1)
Potassium: 4.7 mmol/L (ref 3.5–5.1)
SODIUM: 143 mmol/L (ref 135–145)
SODIUM: 146 mmol/L — AB (ref 135–145)
Total Bilirubin: <0.1 mg/dL — ABNORMAL LOW (ref 0.3–1.2)
Total Bilirubin: 0.2 mg/dL — ABNORMAL LOW (ref 0.3–1.2)
Total Protein: 5 g/dL — ABNORMAL LOW (ref 6.5–8.1)
Total Protein: 5 g/dL — ABNORMAL LOW (ref 6.5–8.1)

## 2016-03-20 LAB — LACTIC ACID, PLASMA
LACTIC ACID, VENOUS: 3.1 mmol/L — AB (ref 0.5–1.9)
LACTIC ACID, VENOUS: 2 mmol/L — AB (ref 0.5–1.9)
Lactic Acid, Venous: 0.9 mmol/L (ref 0.5–1.9)

## 2016-03-20 LAB — GLUCOSE, CAPILLARY
GLUCOSE-CAPILLARY: 348 mg/dL — AB (ref 65–99)
GLUCOSE-CAPILLARY: 334 mg/dL — AB (ref 65–99)
GLUCOSE-CAPILLARY: 253 mg/dL — AB (ref 65–99)
GLUCOSE-CAPILLARY: 218 mg/dL — AB (ref 65–99)
GLUCOSE-CAPILLARY: 186 mg/dL — AB (ref 65–99)
GLUCOSE-CAPILLARY: 132 mg/dL — AB (ref 65–99)
GLUCOSE-CAPILLARY: 127 mg/dL — AB (ref 65–99)
Glucose-Capillary: 361 mg/dL — ABNORMAL HIGH (ref 65–99)
Glucose-Capillary: 265 mg/dL — ABNORMAL HIGH (ref 65–99)
Glucose-Capillary: 193 mg/dL — ABNORMAL HIGH (ref 65–99)
Glucose-Capillary: 130 mg/dL — ABNORMAL HIGH (ref 65–99)
Glucose-Capillary: 153 mg/dL — ABNORMAL HIGH (ref 65–99)

## 2016-03-20 LAB — URINALYSIS COMPLETE WITH MICROSCOPIC (ARMC ONLY)
Bilirubin Urine: NEGATIVE
Hgb urine dipstick: NEGATIVE
KETONES UR: NEGATIVE mg/dL
Leukocytes, UA: NEGATIVE
NITRITE: NEGATIVE
Protein, ur: NEGATIVE mg/dL
RBC / HPF: NONE SEEN RBC/hpf (ref 0–5)
SPECIFIC GRAVITY, URINE: 1.024 (ref 1.005–1.030)
Squamous Epithelial / LPF: NONE SEEN
pH: 6 (ref 5.0–8.0)

## 2016-03-20 LAB — CORTISOL: CORTISOL PLASMA: 12.7 ug/dL

## 2016-03-20 LAB — PROTIME-INR
INR: 1.5
INR: 1.46
Prothrombin Time: 18.2 seconds — ABNORMAL HIGH (ref 11.4–15.0)
Prothrombin Time: 17.8 seconds — ABNORMAL HIGH (ref 11.4–15.0)

## 2016-03-20 LAB — LIPASE, BLOOD: LIPASE: 23 U/L (ref 11–51)

## 2016-03-20 LAB — HEMOGLOBIN A1C: Hgb A1c MFr Bld: 7.5 % — ABNORMAL HIGH (ref 4.0–6.0)

## 2016-03-20 LAB — MRSA PCR SCREENING: MRSA by PCR: NEGATIVE

## 2016-03-20 LAB — TYPE AND SCREEN
ABO/RH(D): A NEG
Antibody Screen: NEGATIVE

## 2016-03-20 LAB — APTT
APTT: 24 s (ref 24–36)
aPTT: 24 seconds (ref 24–36)

## 2016-03-20 LAB — TROPONIN I
Troponin I: 0.13 ng/mL (ref <0.03)
Troponin I: 0.24 ng/mL (ref <0.03)

## 2016-03-20 LAB — PROCALCITONIN: PROCALCITONIN: 0.45 ng/mL

## 2016-03-20 MED ORDER — SODIUM CHLORIDE 0.9 % IV BOLUS (SEPSIS)
2000.0000 mL | Freq: Once | INTRAVENOUS | Status: AC
  Administered 2016-03-20: 2000 mL via INTRAVENOUS

## 2016-03-20 MED ORDER — CETYLPYRIDINIUM CHLORIDE 0.05 % MT LIQD
7.0000 mL | Freq: Two times a day (BID) | OROMUCOSAL | Status: DC
  Administered 2016-03-21 – 2016-03-28 (×11): 7 mL via OROMUCOSAL

## 2016-03-20 MED ORDER — CLONAZEPAM 0.5 MG PO TABS
0.5000 mg | ORAL_TABLET | Freq: Two times a day (BID) | ORAL | Status: DC
  Administered 2016-03-20 – 2016-03-29 (×19): 0.5 mg via ORAL
  Filled 2016-03-20 (×20): qty 1

## 2016-03-20 MED ORDER — SODIUM CHLORIDE 0.9 % IV SOLN
INTRAVENOUS | Status: DC
  Administered 2016-03-20 – 2016-03-21 (×3): via INTRAVENOUS

## 2016-03-20 MED ORDER — DEXTROSE 5 % IV SOLN
2.0000 g | Freq: Once | INTRAVENOUS | Status: AC
  Administered 2016-03-20: 2 g via INTRAVENOUS
  Filled 2016-03-20: qty 2

## 2016-03-20 MED ORDER — VANCOMYCIN 50 MG/ML ORAL SOLUTION
500.0000 mg | Freq: Once | ORAL | Status: AC
  Administered 2016-03-20: 500 mg via ORAL
  Filled 2016-03-20: qty 10

## 2016-03-20 MED ORDER — IOPAMIDOL (ISOVUE-370) INJECTION 76%
75.0000 mL | Freq: Once | INTRAVENOUS | Status: AC | PRN
Start: 2016-03-20 — End: 2016-03-20
  Administered 2016-03-20: 75 mL via INTRAVENOUS

## 2016-03-20 MED ORDER — SACCHAROMYCES BOULARDII 250 MG PO CAPS
250.0000 mg | ORAL_CAPSULE | Freq: Two times a day (BID) | ORAL | Status: DC
  Administered 2016-03-20 – 2016-03-29 (×19): 250 mg via ORAL
  Filled 2016-03-20 (×21): qty 1

## 2016-03-20 MED ORDER — CHLORHEXIDINE GLUCONATE 0.12 % MT SOLN
15.0000 mL | Freq: Two times a day (BID) | OROMUCOSAL | Status: DC
  Administered 2016-03-20 – 2016-03-29 (×16): 15 mL via OROMUCOSAL
  Filled 2016-03-20 (×16): qty 15

## 2016-03-20 MED ORDER — ONDANSETRON HCL 4 MG PO TABS: 4.0000 mg | ORAL_TABLET | Freq: Four times a day (QID) | ORAL | Status: DC | PRN

## 2016-03-20 MED ORDER — SODIUM CHLORIDE 0.9 % IV BOLUS (SEPSIS)
1000.0000 mL | Freq: Once | INTRAVENOUS | Status: AC
  Administered 2016-03-20: 1000 mL via INTRAVENOUS

## 2016-03-20 MED ORDER — HYDROCORTISONE NA SUCCINATE PF 100 MG IJ SOLR
50.0000 mg | Freq: Four times a day (QID) | INTRAMUSCULAR | Status: AC
  Administered 2016-03-20 – 2016-03-21 (×4): 50 mg via INTRAVENOUS
  Filled 2016-03-20 (×4): qty 2

## 2016-03-20 MED ORDER — LEVALBUTEROL HCL 1.25 MG/3ML IN NEBU
1.2500 mg | INHALATION_SOLUTION | RESPIRATORY_TRACT | Status: DC | PRN
  Administered 2016-03-23: 1.25 mg via RESPIRATORY_TRACT
  Filled 2016-03-20: qty 0.5
  Filled 2016-03-20: qty 3
  Filled 2016-03-20: qty 0.5
  Filled 2016-03-20: qty 3

## 2016-03-20 MED ORDER — LEVETIRACETAM 500 MG PO TABS
500.0000 mg | ORAL_TABLET | Freq: Two times a day (BID) | ORAL | Status: DC
  Administered 2016-03-20 – 2016-03-27 (×15): 500 mg via ORAL
  Filled 2016-03-20 (×16): qty 1

## 2016-03-20 MED ORDER — IPRATROPIUM-ALBUTEROL 0.5-2.5 (3) MG/3ML IN SOLN: 3.0000 mL | Freq: Three times a day (TID) | RESPIRATORY_TRACT | Status: DC

## 2016-03-20 MED ORDER — ROSUVASTATIN CALCIUM 5 MG PO TABS
20.0000 mg | ORAL_TABLET | ORAL | Status: DC
  Administered 2016-03-20 – 2016-03-28 (×9): 20 mg via ORAL
  Filled 2016-03-20 (×7): qty 4
  Filled 2016-03-20: qty 1
  Filled 2016-03-20 (×2): qty 4

## 2016-03-20 MED ORDER — IPRATROPIUM-ALBUTEROL 0.5-2.5 (3) MG/3ML IN SOLN: 3.0000 mL | Freq: Four times a day (QID) | RESPIRATORY_TRACT | Status: DC

## 2016-03-20 MED ORDER — GLUCERNA 1.2 CAL PO LIQD
1000.0000 mL | ORAL | Status: DC
  Administered 2016-03-20 – 2016-03-29 (×11): 1000 mL

## 2016-03-20 MED ORDER — SODIUM CHLORIDE 0.9 % IV SOLN
INTRAVENOUS | Status: DC
  Administered 2016-03-20: 2.7 [IU]/h via INTRAVENOUS
  Filled 2016-03-20: qty 2.5

## 2016-03-20 MED ORDER — VANCOMYCIN 50 MG/ML ORAL SOLUTION
125.0000 mg | Freq: Four times a day (QID) | ORAL | Status: DC
  Administered 2016-03-20 – 2016-03-21 (×3): 125 mg via ORAL
  Filled 2016-03-20 (×6): qty 2.5

## 2016-03-20 MED ORDER — ACETAMINOPHEN 325 MG PO TABS
650.0000 mg | ORAL_TABLET | Freq: Four times a day (QID) | ORAL | Status: DC | PRN
  Administered 2016-03-23: 650 mg via ORAL
  Filled 2016-03-20: qty 2

## 2016-03-20 MED ORDER — ONDANSETRON HCL 4 MG/2ML IJ SOLN: 4.0000 mg | Freq: Four times a day (QID) | INTRAMUSCULAR | Status: DC | PRN

## 2016-03-20 MED ORDER — INSULIN ASPART 100 UNIT/ML ~~LOC~~ SOLN: 2.0000 [IU] | SUBCUTANEOUS | Status: DC

## 2016-03-20 MED ORDER — IPRATROPIUM-ALBUTEROL 0.5-2.5 (3) MG/3ML IN SOLN
3.0000 mL | Freq: Four times a day (QID) | RESPIRATORY_TRACT | Status: DC
  Administered 2016-03-20 – 2016-03-24 (×14): 3 mL via RESPIRATORY_TRACT
  Filled 2016-03-20 (×15): qty 3

## 2016-03-20 MED ORDER — TRAZODONE HCL 50 MG PO TABS: 25.0000 mg | ORAL_TABLET | Freq: Every evening | ORAL | Status: DC | PRN

## 2016-03-20 MED ORDER — HEPARIN SODIUM (PORCINE) 5000 UNIT/ML IJ SOLN: 5000.0000 [IU] | Freq: Three times a day (TID) | INTRAMUSCULAR | Status: DC

## 2016-03-20 MED ORDER — HYDROCODONE-ACETAMINOPHEN 5-325 MG PO TABS
1.0000 | ORAL_TABLET | ORAL | Status: DC | PRN
  Administered 2016-03-27: 2 via ORAL
  Filled 2016-03-20: qty 2

## 2016-03-20 MED ORDER — LEVOTHYROXINE SODIUM 50 MCG PO TABS
25.0000 ug | ORAL_TABLET | Freq: Every day | ORAL | Status: DC
  Administered 2016-03-21 – 2016-03-29 (×9): 25 ug via ORAL
  Filled 2016-03-20 (×5): qty 1
  Filled 2016-03-20: qty 2
  Filled 2016-03-20 (×3): qty 1

## 2016-03-20 MED ORDER — LEVALBUTEROL HCL 1.25 MG/3ML IN NEBU: 1.2500 mg | INHALATION_SOLUTION | Freq: Three times a day (TID) | RESPIRATORY_TRACT | Status: DC

## 2016-03-20 MED ORDER — DEXTROSE 5 % IV SOLN
2.0000 g | Freq: Three times a day (TID) | INTRAVENOUS | Status: DC
  Filled 2016-03-20 (×2): qty 2

## 2016-03-20 MED ORDER — ADULT MULTIVITAMIN W/MINERALS CH
1.0000 | ORAL_TABLET | Freq: Every day | ORAL | Status: DC
  Administered 2016-03-20 – 2016-03-29 (×10): 1 via ORAL
  Filled 2016-03-20 (×10): qty 1

## 2016-03-20 MED ORDER — NOREPINEPHRINE 4 MG/250ML-% IV SOLN: 5.0000 ug/min | INTRAVENOUS | Status: DC

## 2016-03-20 MED ORDER — DEXTROSE 5 % IV SOLN
1.0000 g | Freq: Three times a day (TID) | INTRAVENOUS | Status: DC
  Filled 2016-03-20 (×2): qty 1

## 2016-03-20 MED ORDER — GENTAMICIN SULFATE 40 MG/ML IJ SOLN
1.5000 mg/kg | Freq: Once | INTRAVENOUS | Status: AC
  Administered 2016-03-20: 100 mg via INTRAVENOUS
  Filled 2016-03-20: qty 2.5

## 2016-03-20 MED ORDER — METRONIDAZOLE IN NACL 5-0.79 MG/ML-% IV SOLN
500.0000 mg | Freq: Three times a day (TID) | INTRAVENOUS | Status: DC
  Administered 2016-03-20 – 2016-03-21 (×3): 500 mg via INTRAVENOUS
  Filled 2016-03-20 (×5): qty 100

## 2016-03-20 MED ORDER — ENOXAPARIN SODIUM 40 MG/0.4ML ~~LOC~~ SOLN
40.0000 mg | SUBCUTANEOUS | Status: DC
  Administered 2016-03-20 – 2016-03-28 (×9): 40 mg via SUBCUTANEOUS
  Filled 2016-03-20 (×9): qty 0.4

## 2016-03-20 MED ORDER — CEFEPIME HCL 1 G IJ SOLR
1.0000 g | Freq: Three times a day (TID) | INTRAMUSCULAR | Status: DC
  Administered 2016-03-20 – 2016-03-21 (×3): 1 g via INTRAVENOUS
  Filled 2016-03-20 (×5): qty 1

## 2016-03-20 MED ORDER — ACETAMINOPHEN 650 MG RE SUPP: 650.0000 mg | Freq: Four times a day (QID) | RECTAL | Status: DC | PRN

## 2016-03-20 MED ORDER — METOPROLOL TARTRATE 50 MG PO TABS
50.0000 mg | ORAL_TABLET | Freq: Two times a day (BID) | ORAL | Status: DC
  Administered 2016-03-20 – 2016-03-29 (×18): 50 mg via ORAL
  Filled 2016-03-20 (×19): qty 1

## 2016-03-20 MED ORDER — SODIUM CHLORIDE 0.9% FLUSH
3.0000 mL | Freq: Two times a day (BID) | INTRAVENOUS | Status: DC
  Administered 2016-03-20 – 2016-03-29 (×19): 3 mL via INTRAVENOUS

## 2016-03-20 MED ORDER — DEXTROSE 5 % IV SOLN: 2.0000 g | Freq: Once | INTRAVENOUS | Status: DC

## 2016-03-20 MED ORDER — NOREPINEPHRINE BITARTRATE 1 MG/ML IV SOLN: 5.0000 ug/min | INTRAVENOUS | Status: DC

## 2016-03-20 MED ORDER — FREE WATER
200.0000 mL | Freq: Three times a day (TID) | Status: DC
  Administered 2016-03-20 – 2016-03-29 (×27): 200 mL

## 2016-03-20 MED ORDER — PREDNISONE 5 MG PO TABS
5.0000 mg | ORAL_TABLET | Freq: Every day | ORAL | Status: DC
  Administered 2016-03-21 – 2016-03-24 (×4): 5 mg via ORAL
  Filled 2016-03-20 (×4): qty 1

## 2016-03-20 MED ORDER — FAMOTIDINE 20 MG PO TABS
20.0000 mg | ORAL_TABLET | Freq: Two times a day (BID) | ORAL | Status: DC
  Administered 2016-03-20 – 2016-03-21 (×3): 20 mg via ORAL
  Filled 2016-03-20 (×3): qty 1

## 2016-03-20 MED ORDER — SODIUM CHLORIDE 0.9 % IV BOLUS (SEPSIS)
250.0000 mL | Freq: Once | INTRAVENOUS | Status: AC
  Administered 2016-03-20: 250 mL via INTRAVENOUS

## 2016-03-20 NOTE — ED Provider Notes (Signed)
[facility] Emergency Department Provider Note  ____________________________________________  Time seen: 6:35 AM  I have reviewed the triage vital signs and the nursing notes.   HISTORY  Chief Complaint Shortness of Breath  Level 5 caveat:  Portions of the history and physical were unable to be obtained due to the patient's acute illness  HPI Shelton SilvasVicky D Harrell is a 65 y.o. female who complains of "not feeling right". She was recently treated for pneumonia at home but is unable to recall what antibiotic she took. This was 2 weeks ago the patient's report. Denies chest pain, but is short of breath. Tachycardic and tachypneic. Also recent history of C. difficile. Patient is on 2 L nasal cannula at home.     Past Medical History  Diagnosis Date  . COPD (chronic obstructive pulmonary disease) [facility](HCC)      Patient Active Problem List   Diagnosis Date Noted  . Tracheostomy status (HCC)   . Hypernatremia 08/19/2014  . Acute respiratory failure with hypoxia (HCC)   . HCAP (healthcare-associated pneumonia)   . Acute on chronic respiratory failure (HCC) 08/11/2014  . Fungemia 08/11/2014  . Hypomagnesemia 08/01/2014  . Hypophosphatemia 08/01/2014  . Subarachnoid hemorrhage (HCC)   . Acute respiratory failure (HCC)   . Absolute anemia   . COPD, moderate (HCC) 05/18/2014     Past Surgical History  Procedure Laterality Date  . Craniotomy Right 07/27/2014    Procedure: CRANIOTOMY INTRACRANIAL ANEURYSM FOR Pterional Aneurysm;  Surgeon: Lisbeth RenshawNeelesh Nundkumar, MD;  Location: [facility];  Service: Neurosurgery;  Laterality: Right;  . Esophagogastroduodenoscopy (egd) with propofol N/A 08/05/2014    Procedure: ESOPHAGOGASTRODUODENOSCOPY (EGD) WITH PROPOFOL;  Surgeon: [name] Thompson, MD;  [facility];  Service: General;  Laterality: N/A;  . Peg placement N/A 08/05/2014    Procedure: PERCUTANEOUS ENDOSCOPIC GASTROSTOMY (PEG) PLACEMENT;  Surgeon: [name] Thompson,  MD;  [facility];  Service: General;  Laterality: N/A;  bedside/tech only     No current outpatient prescriptions on file.   Allergies Vancomycin   History reviewed. No pertinent family history.  Social History Social History  Substance Use Topics  . Smoking status: Never Smoker   . Smokeless tobacco: None  . Alcohol Use: No    Review of Systems  Constitutional:   No fever or chills.   Cardiovascular:   No chest pain. Respiratory:   Positive shortness of breath. Gastrointestinal:   Negative for abdominal pain, vomiting and diarrhea.  Genitourinary:   Negative for dysuria or difficulty urinating. 10-point ROS otherwise negative.  ____________________________________________   PHYSICAL EXAM:  VITAL SIGNS: ED Triage Vitals  Enc Vitals Group     BP 03/20/16 0627 112/83 mmHg     Pulse Rate 03/20/16 0627 124     Resp 03/20/16 0627 42     Temp 03/20/16 0627 98.2 F (36.8 C)     Temp src --      SpO2 03/20/16 0627 88 %     Weight 03/20/16 0627 105 lb (47.628 kg)     Height 03/20/16 0627 5' (1.524 m)     Head Cir --      Peak Flow --      Pain Score 03/20/16 0629 0     Pain Loc --      Pain Edu? --      Excl. in GC? --     Vital signs reviewed, nursing assessments reviewed.   Constitutional:   Alert and oriented. Ill-appearing. Eyes:   No scleral icterus.  No conjunctival pallor. PERRL. EOMI.  No nystagmus. ENT   Head:   Normocephalic and atraumatic.   Nose:   No congestion/rhinnorhea. No septal hematoma   Mouth/Throat:   Dry mucous membranes, no pharyngeal erythema. No peritonsillar mass.    Neck:   No stridor. No SubQ emphysema. No meningismus. Hematological/Lymphatic/Immunilogical:   No cervical lymphadenopathy. Cardiovascular:   Tachycardia heart rate 120. Symmetric bilateral radial and DP pulses.  No murmurs.  Respiratory:   Tachypnea, normal work of breathing. Symmetric breath sounds and no wheezing no rhonchi or  crackles. Gastrointestinal:   Soft and nontender. Non distended. There is no CVA tenderness.  No rebound, rigidity, or guarding. Genitourinary:   deferred Musculoskeletal:   Nontender with normal range of motion in all extremities. No joint effusions.  No lower extremity tenderness.  No edema. Neurologic:   Normal speech and language.  CN 2-10 normal. Motor grossly intact. No gross focal neurologic deficits are appreciated.  Skin:    Skin is warm, dry and intact. No rash noted.  No petechiae, purpura, or bullae.  ____________________________________________    LABS (pertinent positives/negatives) (all labs ordered are listed, but only abnormal results are displayed) Labs Reviewed  COMPREHENSIVE METABOLIC PANEL - Abnormal; Notable for the following:    Chloride 98 (*)    CO2 38 (*)    Glucose, Bld 558 (*)    BUN 31 (*)    Calcium 8.5 (*)    Total Protein 5.0 (*)    Albumin 2.2 (*)    Alkaline Phosphatase 131 (*)    Total Bilirubin <0.1 (*)    All other components within normal limits  LACTIC ACID, PLASMA - Abnormal; Notable for the following:    Lactic Acid, Venous 3.1 (*)    All other components within normal limits  CULTURE, BLOOD (ROUTINE X 2)  CULTURE, BLOOD (ROUTINE X 2)  CULTURE, EXPECTORATED SPUTUM-ASSESSMENT  URINE CULTURE  LACTIC ACID, PLASMA  CBC WITH DIFFERENTIAL/PLATELET  URINALYSIS COMPLETEWITH MICROSCOPIC ([facility] ONLY)  LIPASE, BLOOD  PROTIME-INR  TROPONIN I  APTT   ____________________________________________   EKG  Interpreted by me Sinus tachycardia rate 121, normal axis and intervals. Poor R-wave progression in anterior leads. Normal ST segments and T waves.  ____________________________________________    RADIOLOGY  Chest x-ray reveals right middle lobe infiltrate consistent with pneumonia  ____________________________________________   PROCEDURES CRITICAL CARE Performed by: Scotty Court, Charlann Wayne   Total critical care time: 30  minutes  Critical care time was exclusive of separately billable procedures and treating other patients.  Critical care was necessary to treat or prevent imminent or life-threatening deterioration.  Critical care was time spent personally by me on the following activities: development of treatment plan with patient and/or surrogate as well as nursing, discussions with consultants, evaluation of patient's response to treatment, examination of patient, obtaining history from patient or surrogate, ordering and performing treatments and interventions, ordering and review of laboratory studies, ordering and review of radiographic studies, pulse oximetry and re-evaluation of patient's condition.   ____________________________________________   INITIAL IMPRESSION / ASSESSMENT AND PLAN / ED COURSE  Pertinent labs & imaging results that were available during my care of the patient were reviewed by me and considered in my medical decision making (see chart for details).  Patient presents with tachycardia tachypnea and mild hypoxia on her usual supplemental oxygen. Oxygenation is good with 3 L nasal cannula. Afebrile, good blood pressure. Code sepsis called. Patient has vancomycin allergy, planning to treat for healthcare associated pneumonia so patient is  ordered for cefepime and gentamicin. With vital sign abnormalities and somewhat limited history, we'll also obtain a CT angiogram of the chest. Lactate elevated at 3.1, we'll give 2 L IV saline bolus. Plan for admission   ____________________________________________   FINAL CLINICAL IMPRESSION(S) / ED DIAGNOSES  Final diagnoses:  HCAP (healthcare-associated pneumonia)  Sepsis, due to unspecified organism [facility](HCC)       Portions of this note were generated with dragon dictation software. Dictation errors may occur despite best attempts at proofreading.   Sharman CheekPhillip Denissa Cozart, MD 03/20/16 [id number]

## 2016-03-20 NOTE — ED Notes (Signed)
Received call about critical WBC and troponin.  Numbers relayed to Dr. Mayford KnifeWilliams.

## 2016-03-20 NOTE — Consult Note (Signed)
PULMONARY / CRITICAL CARE MEDICINE   Name: Stephanie Doyle MRN: [id number] DOB: August 06, 1951    ADMISSION DATE:  03/20/2016 CONSULTATION DATE:  03/20/16  REFERRING MD:  Karlene LinemanShah, V  CHIEF COMPLAINT:  C-Diff with sepsis  HISTORY OF PRESENT ILLNESS:   Stephanie Doyle, Stephanie Doyle is a 65 year old old female with a past medical history significant for COPD, subarachnoid hemorrhage11/2015, seizures, history of fungemia, acute respiratory failure requiring PEG and trach. Was able to reverse trach. [facility] and thereafter was moved to [place]. Patient was seen by NP for acute shortness of breath, low sats, tachycardia, crackles and wheezing. She received Lasix and Solu-Medrol and her symptoms improved. On 6/27 patient was brought to [facility] for shortness of breath, tachycardia. Patient was recently treated for pneumonia at home but unable to recall the name of the antibiotic. Patient states that she had difficulty in breathing, chronic productive cough, mild abdominal pain, C. difficile positive. Her labs today is sodium-143, potassium-4.7 BUN-28, creatinine-0.85, troponin-0.24, WBC-56.3, lactic acid-3.1. Patient was initiated on sepsis protocol and [facility] team was consulted for further management.  PAST MEDICAL HISTORY :  She  has a past medical history of COPD (chronic obstructive pulmonary disease) (HCC).  PAST SURGICAL HISTORY: She  has past surgical history that includes Craniotomy (Right, 07/27/2014); Esophagogastroduodenoscopy (egd) with propofol (N/A, 08/05/2014); and PEG placement (N/A, 08/05/2014).  Allergies  Allergen Reactions  . Augmentin [Amoxicillin-Pot Clavulanate] Other (See Comments)    Reaction: unknown  . Vancomycin     Pt had red rash all over body after vanc was given, this was told to me in report but had not been added to the allergy list     No current facility-administered medications on file prior to encounter.    No current outpatient prescriptions on file prior to encounter.    FAMILY HISTORY:  Her has no family status information on file.   SOCIAL HISTORY: She  reports that she has never smoked. She does not have any smokeless tobacco history on file. She reports that she does not drink alcohol.  REVIEW OF SYSTEMS:   Unable to obtain as the patient is confused at times  SUBJECTIVE:  Unable to obtain  VITAL SIGNS: BP 112/64 mmHg  Pulse 114  Temp(Src) 98.7 F (37.1 C) (Oral)  Resp 48  Ht 5' (1.524 m)  Wt 145 lb 8.1 oz (66 kg)  BMI 28.42 kg/m2  SpO2 99%  HEMODYNAMICS:    VENTILATOR SETTINGS: Vent Mode:  [-]  FiO2 (%):  [21 %] 21 %  INTAKE / OUTPUT:    PHYSICAL EXAMINATION: General:  Caucasian female, found on nasal cannula, no acute distress noted Neuro: Awake, alert, answers appropriately, confused at times HEENT:  Atraumatic, normocephalic, PERRLA, no discharge no JVD Cardiovascular: Tachycardia, regular, S1 and S2, no murmur rub or gallop Lungs: Diminished breath sounds bilaterally, no wheezes, crackles noted Abdomen: Soft, slightly tender on palpation, peg tube in place, active bowel sounds Musculoskeletal: No inflammation/deformity noted :  Skin:  Warm and dry, no ulcer/rash noted  LABS:  BMET  Recent Labs Lab 03/17/16 0910 03/20/16 0634 03/20/16 1112  NA 127* 143 146*  K 4.5 4.5 4.7  CL 87* 98* 104  CO2 34* 38* 35*  BUN 14 31* 28*  CREATININE 0.64 0.94 0.85  GLUCOSE 344* 558* 398*    Electrolytes  Recent Labs Lab 03/17/16 0910 03/20/16 0634 03/20/16 1112  CALCIUM 8.1* 8.5* 8.0*  CBC  Recent Labs Lab 03/17/16 0910 03/20/16 0634 03/20/16 1112  WBC 16.8* 55.3* 56.3*  HGB 10.6* 11.2* 11.0*  HCT 31.9* 35.1 34.2*  PLT 241 308 306    Coag's  Recent Labs Lab 03/20/16 0634 03/20/16 1112  APTT 24  --   INR 1.50 1.46    Sepsis Markers  Recent Labs Lab 03/20/16 0634 03/20/16 1031  LATICACIDVEN 3.1* 2.0*    ABG No results  for input(s): PHART, PCO2ART, PO2ART in the last 168 hours.  Liver Enzymes  Recent Labs Lab 03/13/16 1228 03/20/16 0634 03/20/16 1112  AST ALT 52 35 31  ALKPHOS 57 131* 116  BILITOT 0.2* <0.1* 0.2*  ALBUMIN 2.7* 2.2* 2.1*    Cardiac Enzymes  Recent Labs Lab 03/20/16 0634 03/20/16 1112  TROPONINI 0.13* 0.24*    Glucose  Recent Labs Lab 03/14/16 0554 03/20/16 1013 03/20/16 1214  GLUCAP 101* 361* 348*    Imaging Ct Angio Chest Pe W/cm &/or Wo Cm  03/20/2016  CLINICAL DATA:  Tachycardia, tachypnea, and dyspnea. EXAM: CT ANGIOGRAPHY CHEST WITH CONTRAST TECHNIQUE: Multidetector CT imaging of the chest was performed using the standard protocol during bolus administration of intravenous contrast. Multiplanar CT image reconstructions and MIPs were obtained to evaluate the vascular anatomy. CONTRAST:  75 cc Isovue 370 intravenous COMPARISON:  05/10/2014 FINDINGS: Cardiovascular: Normal heart size. No pericardial effusion. Atherosclerosis, including along the LAD. When allowing for intermittent motion artifact there is no indication of acute pulmonary embolism. No acute aortic finding. Mediastinum:  Negative for adenopathy. Lungs/Pleura: Extensive airway thickening with segmental narrowing or collapse. There is chronic collapse of the right middle lobe. Heterogeneous density of the apical lungs likely from air trapping, similar pattern seen on comparison exam. No suspicious nodularity when compared to prior; small clusters of pulmonary nodules are stable to decreased. Trace pleural effusions. Upper abdomen: No acute findings. Musculoskeletal: Mild body wall edema, symmetric. No acute osseous finding. Review of the MIP images confirms the above findings. IMPRESSION: 1. Advanced bronchitis with diffuse airway narrowing and chronic right middle lobe collapse. No consolidating pneumonia. 2. Motion degraded study without evidence of pulmonary embolism. 3. Trace pleural effusions.  Electronically Signed   By: Marnee Spring M.D.   On: 03/20/2016 09:34   Dg Chest Port 1 View  03/20/2016  CLINICAL DATA:  Tachypnea, tachycardia, sepsis. EXAM: PORTABLE CHEST 1 VIEW COMPARISON:  CT and plain film 03/20/2016 FINDINGS: Medial right infrahilar airspace disease again noted seen on prior plain films and CT compatible with right middle lobe collapse. Bibasilar atelectasis. Heart is normal size. No effusions or acute bony abnormality. No change since prior study. IMPRESSION: Continued right middle lobe collapse.  Bibasilar atelectasis. No change since prior study. Electronically Signed   By: Charlett Nose M.D.   On: 03/20/2016 11:02   Dg Chest Port 1 View  03/20/2016  CLINICAL DATA:  Tachypnea and tachycardia EXAM: PORTABLE CHEST 1 VIEW COMPARISON:  September 02, 2014 FINDINGS: Tracheostomy catheter no longer present. There is airspace consolidation with volume loss in the right middle lobe adjacent to the right heart border. Lungs elsewhere clear. Heart size and pulmonary vascularity are normal. No adenopathy. There is calcification in the aortic arch region. IMPRESSION: Right middle lobe airspace opacity, likely pneumonia. Lungs elsewhere clear. Cardiac silhouette within normal limits. Aortic atherosclerosis. Electronically Signed   By: Bretta Bang III M.D.   On: 03/20/2016 07:19     STUDIES:  6/27 CT chest>>. Advanced bronchitis with diffuse airway  narrowing and chronicright middle lobe collapse. No consolidating pneumonia.. Motion degraded study without evidence of pulmonary embolism.. Trace pleural effusions.   CULTURES: 6/27 blood culture>> 6/27 urine culture>> 6/27 sputum culture>> 6/20 C. difficile positive>>  ANTIBIOTICS:  6/27 gentamicin >6/27 6/27 cefepime> 6/27 6/27 Aztreonam>>  SIGNIFICANT EVENTS:  6/27 patient admitted to the ICU with sepsis related to C. difficile . LINES/TUBES:  none  DISCUSSION:  65 year old female with COPD, subarachnoid hemorrhage  now admitted with sepsis  ASSESSMENT / PLAN:  PULMONARY A: History of COPD History of respiratory failure S/P trach, decanulated P:   Continue to support with oxygen to keep sats greater than 88% Patient is a DO NOT RESUSCITATE Continue Solu-Cortef Continue bronchodilators Flutter valve/ incentive spirometer CARDIOVASCULAR A:  Septic shock Hyperlipidemia P:  Continuous telemetry Keep map goals> 65 Hold metoprolol Continue Crestor  RENAL A:   No active issues Hypoalbuminemia P:   Replace electrolytes per ICU protocol  GASTROINTESTINAL A:   C. difficile P:   Enteric precautions placed Initiate tube feedings Continue Pepcid flagyl  HEMATOLOGIC A:   No active issues P:  SCDs Heparin for DVT prophylaxis Transfuse if Hgb <7  INFECTIOUS A:  Leukocytosis Possible healthcare acquired pneumonia Septic shock  C. difficile history of fungemia? Lung Vs GI  P:   Continue cefepime Continue Flagyl ID consulted Trend lactic acid CBC in a.m.  ENDOCRINE A:   Hyperglycemia Hypothyroidism P:   Insulin GTT Blood sugar checks every 4 hours Continue Synthroid  NEUROLOGIC A:   History of seizures History of depression P:   RASS goal: 0 Continue Keppra continue Zoloft  Bincy Varughese,AG-ACNP Pulmonary & Critical Care Pulmonary and Critical Care Medicine [facility] Pager: [phone]  03/20/2016, 12:25 PM  STAFF NOTE: I, Dr. Stephanie AcreVishal Liviah Cake have personally reviewed patient's available data, including medical history, events of note, physical examination and test results as part of my evaluation. I have discussed with NP Hervey Ardukov Varughese  and other care providers such as pharmacist, RN and RRT.  In addition,  I personally evaluated patient and elicited key findings of   HPI:  65 year old female past medical history of subarachnoid hemorrhage, status post a tube placement and trach now decannulated, patient of [facility] for a prolonged  period of time, [facility], presenting with shortness of breath and diarrhea since last Thursday. Review per records, patient condition deteriorated last Saturday, with hypoxia, tachycardia, decreased lung sounds and crackles with wheezing. She did get Lasix 40 mg 2 and Solu-Medrol 125 mg 1. A C. difficile panel was ordered on 6/20 that showed positive toxin and antigen. Lactic acid was initially 3.1, now down to 2.0, she has increasing blood glucose levels and was placed on insulin drip, sepsis is now resolving and she is noted to have chronic right middle lobe collapse based on recent chest CT and x-rays dating back to December 2015. CCM was counseled for further management  O:  GEN-confused, but appropriate at times  HEENT-atraumatic, normocephalic, PERRLA, no icterus CVS-s1, s2, RR LUNGS-coarse upper airway sounds, no wheezing, dec BS  ABD-soft, mild generalized tenderness, +BS MSK-no lesions    A: 65 year old female with past medical history of COPD, subarachnoid hemorrhage, previous tracheostomy now decannulated, admitted for C. difficile related sepsis  Dyspnea Altered mental status Metabolic encephalopathy Sepsis-now resolving C. difficile Hyperglycemia  P:   -Altered mental status most likely related to underlying C. difficile infection -We'll give patient 1 dose of vancomycin, and start on cefepime and Flagyl. Will treat as  bronchitis and not so much a pneumonia. I could not identify a significant infiltrate or consolidated area in her chest CT from this morning. -Patient right middle lobe collapse is chronic, will benefit from incentive spirometry once mentation becomes more appropriate -Wean insulin drip -Continue IV fluids -Continue bronchodilators -Stress dose steroids for one more day then stop   .  Rest per NP/medical resident whose note is outlined above and that I agree with  The patient is critically ill with multiple organ systems failure and requires high  complexity decision making for assessment and support, frequent evaluation and titration of therapies, application of advanced monitoring technologies and extensive interpretation of multiple databases.   Critical Care Time devoted to patient care services described in this note is  45 Minutes.   This time reflects time of care of this signee Dr Stephanie Acre.  This critical care time does not reflect procedure time, or teaching time or supervisory time of PA/NP/Med-student/Med Resident etc but could involve care discussion time.  Stephanie Acre, MD [place] Pager [phone] (please enter 7-digits) On Call Pager - [phone] (please enter 7-digits)  Note: This note was prepared with Dragon dictation along with smaller phrase technology. Any transcriptional errors that result from this process are unintentional.

## 2016-03-20 NOTE — ED Notes (Signed)
[facility] rm [facility].  Per EMS, pt recent hx of pneumonia, this morning became tachypneic and tachycardic with RR 40 and HR 120.  Pt also dx cdiff positive.  EMS report pt had fever few days ago but afebrile with them.  Pt hx COPD on 2L chronically.  PT denies pain.  PT NAD at this time, resp equal but tachypneic, skin warm and dry.

## 2016-03-20 NOTE — ED Notes (Signed)
Pharmacy called again requesting gentamicin.

## 2016-03-20 NOTE — Plan of Care (Signed)
Problem: Tissue Perfusion: Goal: Risk factors for ineffective tissue perfusion will decrease Outcome: Progressing Refused TEDs hose, SCDs, and foot pumps but patient with subcutaneous lovenox ordered.  Problem: Bowel/Gastric: Goal: Will not experience complications related to bowel motility Outcome: Not Progressing Pt with small bowel movements throughout shift. Vancomycin through PEG tube started (pt had no complaints of itching, no change in skin coloration, no breathing difficulties, or any other sign of allergic reaction) and IV flagyl for C. Diff infection. No fevers. Patient's lactic acid trended down to normal after fluid boluses finished. Patient with flat affect, reports does not eat anything by mouth at baseline nor get out of bed even to wheelchair. Blood pressure stable and did not require vasopressors to maintain. O2 titrated to patient's stated home dose of 1 L nasal cannula.  Problem: Respiratory: Goal: Ability to maintain adequate ventilation will improve Outcome: Not Progressing Patient would not participate with flutter valve or incentive spirometer.

## 2016-03-20 NOTE — ED Notes (Signed)
Pharmacy called about need for antibiotics

## 2016-03-20 NOTE — Progress Notes (Signed)
Inpatient Diabetes Program Recommendations  AACE/ADA: New Consensus Statement on Inpatient Glycemic Control (2015)  Target Ranges:  Prepandial:   less than 140 mg/dL      Peak postprandial:   less than 180 mg/dL (1-2 hours)      Critically ill patients:  140 - 180 mg/dL   Lab Results  Component Value Date   GLUCAP 361* 03/20/2016   HGBA1C 6.0 06/23/2015    Review of Glycemic Control  Results for Stephanie SilvasWOODS, Aniyia D (MRN [id number]) as of 03/20/2016 10:31  Ref. Range 03/20/2016 10:13  Glucose-Capillary Latest Ref Range: 65-99 mg/dL 213361 (H)    Diabetes history:none noted * on steroids Outpatient Diabetes medications: none Current orders for Inpatient glycemic control: none  Inpatient Diabetes Program Recommendations:  Per ADA recommendations "consider performing an A1C on all patients with diabetes or hyperglycemia admitted to the hospital if not performed in the prior 3 months".  Consider placing the patient on the IV insulin ICU Glycemic Control phase 2 protocol to safely bring the blood sugar down.   Susette RacerJulie Rochelle Nephew, RN, BA, MHA, CDE Diabetes Coordinator Inpatient Diabetes Program  [phone] (Team Pager) [phone] [facility] Office) 03/20/2016 10:45 AM

## 2016-03-20 NOTE — ED Notes (Signed)
Report called to Maralyn SagoSarah, ICU.  Pt in CT and will go to floor upon return.

## 2016-03-20 NOTE — H&P (Addendum)
[employer] - [place] [facility]   PATIENT NAME: Stephanie Doyle    MR#:  [id number]  DATE OF BIRTH:  [birth year]  DATE OF ADMISSION:  03/20/2016  PRIMARY CARE PHYSICIAN: Lauro Regulus., MD   REQUESTING/REFERRING PHYSICIAN: Sharman Cheek, MD  CHIEF COMPLAINT:   Chief Complaint  Patient presents with  . Shortness of Breath    HISTORY OF PRESENT ILLNESS:  Stephanie Doyle  is a 65 y.o. 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 Reviewing records was also noted that she had a history of fungemia. Per Patient's sister who has health care power of attorney Patient was having diarrhea since last Thursday.  Her health condition worsened last Saturday. Over the weekend, nursing at [facility] reported tachypnea, low sats, tachycardia, lung sounds with crackles and wheezing. Sx improved after Lasix 40 mg IM x 2 and Solumedrol 125 mg IM x 1. Was seen by NP on Monday 6/26 as symptoms were starting to return. Pt c/o "trouble breathing.". She reports chronic productive cough. C/o mild abdominal pain but currently has dx of C-Diff. Here patient seems confused so difficult to obtain accurate subjective and tells me she doesn't know why she is here. Her sister who was bedside most information along with review of medical records. PAST MEDICAL HISTORY:   Past Medical History  Diagnosis Date  . COPD (chronic obstructive pulmonary disease) (HCC)   History of subarachnoid hemorrhage requiring craniotomy in November 2015  PAST SURGICAL HISTORY:   Past Surgical History  Procedure Laterality Date  . Craniotomy Right 07/27/2014    Procedure: CRANIOTOMY INTRACRANIAL ANEURYSM FOR Pterional Aneurysm;  Surgeon: Lisbeth Renshaw, MD;  Location: [facility] NEURO ORS;  Service: Neurosurgery;  Laterality: Right;  . Esophagogastroduodenoscopy  (egd) with propofol N/A 08/05/2014    Procedure: ESOPHAGOGASTRODUODENOSCOPY (EGD) WITH PROPOFOL;  Surgeon: Violeta Gelinas, MD;  Location: [facility] ENDOSCOPY;  Service: General;  Laterality: N/A;  . Peg placement N/A 08/05/2014    Procedure: PERCUTANEOUS ENDOSCOPIC GASTROSTOMY (PEG) PLACEMENT;  Surgeon: Violeta Gelinas, MD;  Location: [facility] ENDOSCOPY;  Service: General;  Laterality: N/A;  bedside/tech only    SOCIAL HISTORY:   Social History  Substance Use Topics  . Smoking status: Never Smoker   . Smokeless tobacco: Not on file  . Alcohol Use: No    FAMILY HISTORY:  History reviewed. No pertinent family history. Unable to be obtained as patient is confused DRUG ALLERGIES:   Allergies  Allergen Reactions  . Augmentin [Amoxicillin-Pot Clavulanate] Other (See Comments)    Reaction: unknown  . Vancomycin     Pt had red rash all over body after vanc was given, this was told to me in report but had not been added to the allergy list     REVIEW OF SYSTEMS:   Review of Systems  Unable to perform ROS: critical illness   MEDICATIONS AT HOME:   Prior to Admission medications   Medication Sig Start Date End Date Taking? Authorizing Provider  acetaminophen (TYLENOL) 325 MG tablet Take 650 mg by mouth every 4 (four) hours as needed.   Yes Historical Provider, MD  albuterol-ipratropium (COMBIVENT) 18-103 MCG/ACT inhaler Inhale 1 puff into the lungs 4 (four) times daily.   Yes Historical Provider, MD  antiseptic oral rinse (BIOTENE) LIQD 15 mLs by Mouth Rinse route 4 (four) times daily. Rinse for 30 seconds and then  spit out   Yes Historical Provider, MD  clonazePAM (KLONOPIN) 0.5 MG tablet Take 0.5 mg by mouth 2 (two) times daily.   Yes Historical Provider, MD  diphenhydrAMINE (BENADRYL) 25 mg capsule 25 mg by Gastric Tube route every 4 (four) hours as needed for itching.   Yes Historical Provider, MD  famotidine (PEPCID) 20 MG tablet Take 20 mg by mouth 2 (two) times daily.   Yes Historical  Provider, MD  hydrocortisone cream 1 % Apply 1 application topically 4 (four) times daily as needed for itching. Apply a thin film to clean, dry hemorrhoids four times a day as needed   Yes Historical Provider, MD  ipratropium-albuterol (DUONEB) 0.5-2.5 (3) MG/3ML SOLN Take 3 mLs by nebulization 3 (three) times daily.   Yes Historical Provider, MD  levalbuterol (XOPENEX) 1.25 MG/3ML nebulizer solution Take 1.25 mg by nebulization 3 (three) times daily.   Yes Historical Provider, MD  levalbuterol (XOPENEX) 1.25 MG/3ML nebulizer solution Take 1.25 mg by nebulization every 4 (four) hours as needed for wheezing.   Yes Historical Provider, MD  levETIRAcetam (KEPPRA) 500 MG tablet Take 500 mg by mouth 2 (two) times daily.   Yes Historical Provider, MD  levothyroxine (SYNTHROID, LEVOTHROID) 25 MCG tablet Take 25 mcg by mouth daily before breakfast.   Yes Historical Provider, MD  Linezolid in Sodium Chloride 600-0.9 MG/300ML-% SOLN Inject 300 mLs into the vein every 12 (twelve) hours. 03/19/16 03/26/16 Yes Historical Provider, MD  metoprolol (LOPRESSOR) 50 MG tablet Take 50 mg by mouth 2 (two) times daily.   Yes Historical Provider, MD  metroNIDAZOLE (FLAGYL) 500 MG tablet Take 500 mg by mouth every 8 (eight) hours. 03/13/16 03/23/16 Yes Historical Provider, MD  morphine (ROXANOL) 20 MG/ML concentrated solution Take 5-10 mg by mouth every hour as needed for severe pain.   Yes Historical Provider, MD  Multiple Vitamin (MULTIVITAMIN WITH MINERALS) TABS tablet Take 1 tablet by mouth daily.   Yes Historical Provider, MD  Nutritional Supplements (FEEDING SUPPLEMENT, JEVITY 1.5 CAL,) LIQD 237 mLs by Gastric Tube route 4 (four) times daily.   Yes Historical Provider, MD  Nutritional Supplements (FEEDING SUPPLEMENT, JEVITY 1.5 CAL,) LIQD 237 mLs by Gastric Tube route daily. (extra feeding is for Blood Glucose support)   Yes Historical Provider, MD  predniSONE (DELTASONE) 5 MG tablet Take 5 mg by mouth daily with breakfast.    Yes Historical Provider, MD  rosuvastatin (CRESTOR) 20 MG tablet Take 20 mg by mouth daily.   Yes Historical Provider, MD  saccharomyces boulardii (FLORASTOR) 250 MG capsule Take 250 mg by mouth 2 (two) times daily.   Yes Historical Provider, MD  sodium chloride 0.9 % infusion Inject 50 mLs into the vein continuous. Give at 3850ml/hour for 48 hours for hyponatremia 03/19/16 03/21/16 Yes Historical Provider, MD  zinc oxide (BALMEX) 11.3 % CREA cream Apply 1 application topically as needed. Apply liberal amount to areas of skin irritation as needed   Yes Historical Provider, MD  zinc oxide 20 % ointment Apply 1 application topically 2 (two) times daily as needed for irritation. Apply a thin film to bottom twice daily and as needed   Yes Historical Provider, MD      VITAL SIGNS:  Blood pressure 130/74, pulse 124, temperature 98.1 F (36.7 C), temperature source Oral, resp. rate 32, height 5' (1.524 m), weight 66 kg (145 lb 8.1 oz), SpO2 99 %. PHYSICAL EXAMINATION:  Physical Exam  Constitutional: She appears malnourished. She appears unhealthy. She appears toxic. She has  a sickly appearance.  HENT:  Head: Normocephalic and atraumatic.  Eyes: Conjunctivae and EOM are normal. Pupils are equal, round, and reactive to light.  Neck: Normal range of motion. Neck supple. No tracheal deviation present. No thyromegaly present.  Cardiovascular: Regular rhythm and normal heart sounds.  Tachycardia present.   Pulmonary/Chest: Tachypnea noted. She is in respiratory distress. She has decreased breath sounds. She has no wheezes. She has rhonchi in the right lower field and the left lower field. She exhibits no tenderness.  Abdominal: Soft. Bowel sounds are normal. She exhibits no distension. There is generalized tenderness.  Musculoskeletal: Normal range of motion.  Neurological: She is alert. She is disoriented. No cranial nerve deficit.  Skin: Skin is warm and dry. No rash noted.  Psychiatric: Mood and affect  normal. She exhibits disordered thought content.  Unable to evaluate as she is confused   LABORATORY PANEL:   CBC  Recent Labs Lab 03/20/16 0634  WBC 55.3*  HGB 11.2*  HCT 35.1  PLT 308   ------------------------------------------------------------------------------------------------------------------  Chemistries   Recent Labs Lab 03/20/16 0634  NA 143  K 4.5  CL 98*  CO2 38*  GLUCOSE 558*  BUN 31*  CREATININE 0.94  CALCIUM 8.5*  AST 30  ALT 35  ALKPHOS 131*  BILITOT <0.1*   ------------------------------------------------------------------------------------------------------------------  Cardiac Enzymes  Recent Labs Lab 03/20/16 0634  TROPONINI 0.13*   ------------------------------------------------------------------------------------------------------------------  RADIOLOGY:  Ct Angio Chest Pe W/cm &/or Wo Cm  03/20/2016  CLINICAL DATA:  Tachycardia, tachypnea, and dyspnea. EXAM: CT ANGIOGRAPHY CHEST WITH CONTRAST TECHNIQUE: Multidetector CT imaging of the chest was performed using the standard protocol during bolus administration of intravenous contrast. Multiplanar CT image reconstructions and MIPs were obtained to evaluate the vascular anatomy. CONTRAST:  75 cc Isovue 370 intravenous COMPARISON:  05/10/2014 FINDINGS: Cardiovascular: Normal heart size. No pericardial effusion. Atherosclerosis, including along the LAD. When allowing for intermittent motion artifact there is no indication of acute pulmonary embolism. No acute aortic finding. Mediastinum:  Negative for adenopathy. Lungs/Pleura: Extensive airway thickening with segmental narrowing or collapse. There is chronic collapse of the right middle lobe. Heterogeneous density of the apical lungs likely from air trapping, similar pattern seen on comparison exam. No suspicious nodularity when compared to prior; small clusters of pulmonary nodules are stable to decreased. Trace pleural effusions. Upper abdomen:  No acute findings. Musculoskeletal: Mild body wall edema, symmetric. No acute osseous finding. Review of the MIP images confirms the above findings. IMPRESSION: 1. Advanced bronchitis with diffuse airway narrowing and chronic right middle lobe collapse. No consolidating pneumonia. 2. Motion degraded study without evidence of pulmonary embolism. 3. Trace pleural effusions. Electronically Signed   By: Marnee Spring M.D.   On: 03/20/2016 09:34   Dg Chest Port 1 View  03/20/2016  CLINICAL DATA:  Tachypnea and tachycardia EXAM: PORTABLE CHEST 1 VIEW COMPARISON:  September 02, 2014 FINDINGS: Tracheostomy catheter no longer present. There is airspace consolidation with volume loss in the right middle lobe adjacent to the right heart border. Lungs elsewhere clear. Heart size and pulmonary vascularity are normal. No adenopathy. There is calcification in the aortic arch region. IMPRESSION: Right middle lobe airspace opacity, likely pneumonia. Lungs elsewhere clear. Cardiac silhouette within normal limits. Aortic atherosclerosis. Electronically Signed   By: Bretta Bang III M.D.   On: 03/20/2016 07:19   IMPRESSION AND PLAN:  65 year old female with a known history of subarachnoid hemorrhage requiring craniotomy in November 2015, status post PEG tube placement requiring tube feeding for  nutrition, tracheostomy (reversed now) [facility]).  * Sepsis - We will initiate sepsis protocol - She does have a history of fungemia, 2 sets of blood culture ordered - Monitor in ICU as stepdown - Possible source could be lung versus GI - Considering her significant leukocytosis, This makes me worry for possible C. difficile  - We will get infectious disease consultation and intensivist consult  * Possible pneumonia - Antibiotics for treatment of healthcare acquired pneumonia - Patient was started on Zyvox 600 mg IV twice a day for total 7  days Yesterday at the facility - We will switch her to aztreonam  * Diarrhea - With so many antibiotics given over last few years and resident of long-term health care facility, She is at high risk for C. Difficile - Place her in isolation and send stool for C. 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 Management plans discussed with the patient, family and they are in agreement.  A long discussion with patient's sister at bedside.  She is in agreement with DO NOT RESUSCITATE and palliative care consultation  CODE STATUS: DO NOT RESUSCITATE  TOTAL TIME (Critical Care) TAKING CARE OF THIS PATIENT: 55 minutes.    [facility] Center[facility], Rachelann Enloe M.D on 03/20/2016 at 10:46 AM  Between 7am to 6pm - Pager - [phone]  After 6pm go to [email or web address] - Social research officer, governmentpassword EPAS [facility]  [facility] [place] [facility]  Office  [phone]  CC: Primary care physician; Lauro RegulusANDERSON,MARSHALL W., MD   Note: This dictation was prepared with Dragon dictation along with smaller phrase technology. Any transcriptional errors that result from this process are  unintentional.

## 2016-03-20 NOTE — ED Notes (Signed)
Per nursing facility, pt does not take pills PO, everything is given through PEG tube

## 2016-03-20 NOTE — Progress Notes (Signed)
Pharmacy Antibiotic Note  Stephanie Doyle is a [age] y.[place] Sil[place]. female admitted on 03/20/2016 with clostridium difficile and bronchitis/PNA.  Pharmacy has been consulted for cefepime dosing.  Plan: Patient tolerated cefepime in the ED. Will continue patient on cefepime 1g IV Q8hr.   Will order vancomycin 500mg  VT x 1 to challenge allergy. If tolerated, will continue patient on vancomycin 500mg  VT Q6hr.    Height: 5' (152.4 cm) Weight: 145 lb 8.1 oz (66 kg) IBW/kg (Calculated) : 45.5  Temp (24hrs), Avg:98.3 F (36.8 C), Min:98.1 F (36.7 C), Max:98.7 F (37.1 C)   Recent Labs Lab 03/15/16 0839 03/17/16 0910 03/20/16 0634 03/20/16 1031 03/20/16 1112  WBC 10.7 16.8* 55.3*  --  56.3*  CREATININE 0.51 0.64 0.94  --  0.85  LATICACIDVEN  --   --  3.1* 2.0*  --     Estimated Creatinine Clearance: 54.4 mL/min (by C-G formula based on Cr of 0.85).    Allergies  Allergen Reactions  . Augmentin [Amoxicillin-Pot Clavulanate] Other (See Comments)    Reaction: unknown  . Vancomycin     Pt had red rash all over body after vanc was given, this was told to me in report but had not been added to the allergy list     Antimicrobials this admission: Cefepime 6/27 >>  Vancomycin PO 6/27 >>  Metronidazole 6/27 >>  Dose adjustments this admission: N/A  Microbiology results: 6/27 BCx: pending  6/27 UCx: pending  6/27 Sputum: pending  6/27 MRSA PCR: negative 6/20 CDiff PCR: positive   Pharmacy will continue to monitor and adjust per consult.    Makensie Mulhall L 03/20/2016 2:21 PM

## 2016-03-20 NOTE — Progress Notes (Signed)
Notified Dr. Sherryll BurgerShah of critical lactic acid of 2.0 (previous 3.1). MD acknowledged and confirmed boluses still infusing of normal saline and confirmed repeat lactic at 13:30. MD ordered NS at 75 mL/hr after boluses finished.

## 2016-03-20 NOTE — ED Notes (Signed)
Medication still not received from pharmacy. ?

## 2016-03-20 NOTE — Progress Notes (Signed)
Initial Nutrition Assessment  DOCUMENTATION CODES:   Not applicable  INTERVENTION:  -TF: due to current medical status, recommend starting continuous feedings of Glucerna 1.2 at rate of 60 ml/hr providing 1728 kcals, 86 g of protein and 1166 mL of free water. Once pt more stable, will reassess formula and ability to transition back to bolus feedings. Recommend starting free water flush of 200 mL q 8 hours  NUTRITION DIAGNOSIS:   Inadequate oral intake related to chronic illness as evidenced by NPO status.  GOAL:   Patient will meet greater than or equal to 90% of their needs  MONITOR:   TF tolerance, Weight trends, Labs  REASON FOR ASSESSMENT:   Consult Enteral/tube feeding initiation and management  ASSESSMENT:   65 yo female admitted with possible sepsis, possible pneumonia, diarrhea with C.diff pending, hyperglycemia with FSBS >500.  Pt with hx of subarachnoid hemorrhage requiring craniotomy, pt with residual brain injury. Pt has PEG tube presently, hx of trach which was reversed   Pt reports she take some food and liquids by mouth but not much, pt could not tell writer what foods in particular she likes to eat. Pt receives most of her nutrition via PEG tube. Wille[facility]Bennett [facility] that pt is offered meal trays at meal periods but rarely eats (pt receives Dysphagia III with Puree Meats and Thin Liquids). Pt receives bolus feedings of Jevity 1.5 1 can 4 times daily (pt has been receiving additional 1 can of Jevity 1.5 due to low blood sugars during then night). RD reports pt on prednisone chronically and finger sticks checked periodically and highest it has been is 220 but is normally wdl.   Pt reports weight gain; spoke with Willeen CassBennett RD at [facility] who does report that pt has gained weight over the past years.   Nutrition-Focused physical exam completed. Findings are wdl fat depletion, mild/muscle muscle depletion, and no edema.      Past Medical History  Diagnosis  Date  . COPD (chronic obstructive pulmonary disease) (HCC)     Diet Order:  Diet clear liquid Room service appropriate?: Yes; Fluid consistency:: Thin  Skin:  Reviewed, no issues  Last BM:  no documented BM, pt has been having diarrhea  Labs:  Sodium 146  Glucose Profile:   Recent Labs  03/20/16 1214 03/20/16 1409 03/20/16 1520  GLUCAP 348* 334* 265*   Meds:  NS at 75 ml/hr, insulin drip, MVI, levophed, prednisone, florastor  Height:   Ht Readings from Last 1 Encounters:  03/20/16 5' (1.524 m)    Weight:   Wt Readings from Last 1 Encounters:  03/20/16 136 lb 14.5 oz (62.1 kg)    Wt Readings from Last 10 Encounters:  03/20/16 145 lb 8.1 oz (66 kg)  08/26/14 106 lb 4.2 oz (48.2 kg)    BMI:  Body mass index is 26.74 kg/(m^2).  Estimated Nutritional Needs:   Kcal:  1650-1980 kcals  Protein:  79-99 g  Fluid:  >/= 1.6 L  EDUCATION NEEDS:   No education needs identified at this time  Romelle StarcherCate Denvil Canning MS, RD, LDN [phone] Pager  [phone] Weekend/On-Call Pager

## 2016-03-21 DIAGNOSIS — J189 Pneumonia, unspecified organism: Secondary | ICD-10-CM

## 2016-03-21 DIAGNOSIS — Z515 Encounter for palliative care: Secondary | ICD-10-CM

## 2016-03-21 DIAGNOSIS — Z66 Do not resuscitate: Secondary | ICD-10-CM

## 2016-03-21 LAB — BASIC METABOLIC PANEL
ANION GAP: 2 — AB (ref 5–15)
BUN: 28 mg/dL — ABNORMAL HIGH (ref 6–20)
CHLORIDE: 111 mmol/L (ref 101–111)
CO2: 32 mmol/L (ref 22–32)
Calcium: 7.7 mg/dL — ABNORMAL LOW (ref 8.9–10.3)
Creatinine, Ser: 0.61 mg/dL (ref 0.44–1.00)
GFR calc non Af Amer: >60 mL/min (ref >60)
GLUCOSE: 135 mg/dL — AB (ref 65–99)
POTASSIUM: 4.1 mmol/L (ref 3.5–5.1)
Sodium: 145 mmol/L (ref 135–145)

## 2016-03-21 LAB — URINE CULTURE: Culture: NO GROWTH

## 2016-03-21 LAB — GLUCOSE, CAPILLARY
GLUCOSE-CAPILLARY: 120 mg/dL — AB (ref 65–99)
GLUCOSE-CAPILLARY: 197 mg/dL — AB (ref 65–99)
GLUCOSE-CAPILLARY: 221 mg/dL — AB (ref 65–99)
GLUCOSE-CAPILLARY: 189 mg/dL — AB (ref 65–99)
Glucose-Capillary: 129 mg/dL — ABNORMAL HIGH (ref 65–99)
Glucose-Capillary: 147 mg/dL — ABNORMAL HIGH (ref 65–99)
Glucose-Capillary: 147 mg/dL — ABNORMAL HIGH (ref 65–99)
Glucose-Capillary: 220 mg/dL — ABNORMAL HIGH (ref 65–99)

## 2016-03-21 LAB — CBC
HCT: 33.8 % — ABNORMAL LOW (ref 35.0–47.0)
HEMOGLOBIN: 10.7 g/dL — AB (ref 12.0–16.0)
MCH: 27.4 pg (ref 26.0–34.0)
MCHC: 31.6 g/dL — ABNORMAL LOW (ref 32.0–36.0)
MCV: 86.6 fL (ref 80.0–100.0)
Platelets: 277 10*3/uL (ref 150–440)
RBC: 3.9 MIL/uL (ref 3.80–5.20)
RDW: 15.5 % — ABNORMAL HIGH (ref 11.5–14.5)
WBC: 48.1 10*3/uL — ABNORMAL HIGH (ref 3.6–11.0)

## 2016-03-21 MED ORDER — VANCOMYCIN 50 MG/ML ORAL SOLUTION
500.0000 mg | Freq: Four times a day (QID) | ORAL | Status: DC
  Administered 2016-03-21 – 2016-03-29 (×32): 500 mg via ORAL
  Filled 2016-03-21 (×37): qty 10

## 2016-03-21 MED ORDER — DEXTROSE 5 % IV SOLN
250.0000 mg | INTRAVENOUS | Status: DC
  Filled 2016-03-21: qty 250

## 2016-03-21 MED ORDER — INSULIN ASPART 100 UNIT/ML ~~LOC~~ SOLN: 0.0000 [IU] | Freq: Every day | SUBCUTANEOUS | Status: DC

## 2016-03-21 MED ORDER — DEXTROSE 5 % IV SOLN
250.0000 mg | INTRAVENOUS | Status: DC
  Administered 2016-03-21: 250 mg via INTRAVENOUS
  Filled 2016-03-21: qty 250

## 2016-03-21 MED ORDER — INSULIN ASPART 100 UNIT/ML ~~LOC~~ SOLN
0.0000 [IU] | SUBCUTANEOUS | Status: DC
  Administered 2016-03-21: 22:00:00 3 [IU] via SUBCUTANEOUS
  Administered 2016-03-21 (×2): 5 [IU] via SUBCUTANEOUS
  Administered 2016-03-22 (×3): 3 [IU] via SUBCUTANEOUS
  Administered 2016-03-22: 2 [IU] via SUBCUTANEOUS
  Administered 2016-03-22 – 2016-03-23 (×3): 3 [IU] via SUBCUTANEOUS
  Administered 2016-03-23: 04:00:00 2 [IU] via SUBCUTANEOUS
  Administered 2016-03-23 (×2): 3 [IU] via SUBCUTANEOUS
  Administered 2016-03-23 (×2): 2 [IU] via SUBCUTANEOUS
  Administered 2016-03-24 – 2016-03-25 (×8): 3 [IU] via SUBCUTANEOUS
  Administered 2016-03-25: 2 [IU] via SUBCUTANEOUS
  Administered 2016-03-25: 17:00:00 3 [IU] via SUBCUTANEOUS
  Administered 2016-03-25 (×2): 2 [IU] via SUBCUTANEOUS
  Administered 2016-03-25 – 2016-03-26 (×5): 3 [IU] via SUBCUTANEOUS
  Administered 2016-03-26: 13:00:00 5 [IU] via SUBCUTANEOUS
  Administered 2016-03-27: 2 [IU] via SUBCUTANEOUS
  Administered 2016-03-27 – 2016-03-28 (×9): 3 [IU] via SUBCUTANEOUS
  Administered 2016-03-28 (×2): 5 [IU] via SUBCUTANEOUS
  Administered 2016-03-29 (×3): 3 [IU] via SUBCUTANEOUS
  Filled 2016-03-21 (×4): qty 3
  Filled 2016-03-21: qty 2
  Filled 2016-03-21: qty 3
  Filled 2016-03-21: qty 2
  Filled 2016-03-21 (×6): qty 3
  Filled 2016-03-21: qty 2
  Filled 2016-03-21 (×3): qty 3
  Filled 2016-03-21: qty 2
  Filled 2016-03-21 (×2): qty 3
  Filled 2016-03-21: qty 2
  Filled 2016-03-21 (×2): qty 3
  Filled 2016-03-21: qty 2
  Filled 2016-03-21 (×2): qty 3
  Filled 2016-03-21: qty 5
  Filled 2016-03-21: qty 3
  Filled 2016-03-21: qty 5
  Filled 2016-03-21 (×9): qty 3
  Filled 2016-03-21: qty 2
  Filled 2016-03-21: qty 3
  Filled 2016-03-21: qty 5
  Filled 2016-03-21 (×2): qty 3
  Filled 2016-03-21: qty 2
  Filled 2016-03-21: qty 3
  Filled 2016-03-21: qty 5
  Filled 2016-03-21: qty 3
  Filled 2016-03-21: qty 5

## 2016-03-21 MED ORDER — INSULIN GLARGINE 100 UNIT/ML ~~LOC~~ SOLN
7.0000 [IU] | Freq: Every day | SUBCUTANEOUS | Status: DC
  Administered 2016-03-22 – 2016-03-28 (×8): 7 [IU] via SUBCUTANEOUS
  Filled 2016-03-21 (×10): qty 0.07

## 2016-03-21 MED ORDER — INSULIN ASPART 100 UNIT/ML ~~LOC~~ SOLN
0.0000 [IU] | Freq: Three times a day (TID) | SUBCUTANEOUS | Status: DC
  Administered 2016-03-21: 3 [IU] via SUBCUTANEOUS
  Filled 2016-03-21: qty 3

## 2016-03-21 NOTE — Consult Note (Signed)
Consultation Note Date: 03/21/2016   Patient Name: Stephanie Doyle  DOB: [birth year]  MRN: [id number]  Age / Sex: 65 y.o., female  PCP: Stephanie Regulus, MD Referring Physician: Delfino Lovett, MD  Reason for Consultation: Establishing goals of care and Psychosocial/spiritual support  HPI/Patient Profile: 65 y.o. 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   Reviewing records was also noted that she had a history of fungemia. Per Patient's sister who has health care power of attorney Patient was having diarrhea since last Thursday. Her health condition worsened last Saturday.   Over the weekend, nursing at [facility] reported tachypnea, low sats, tachycardia, lung sounds with crackles and wheezing. Sx improved after Lasix 40 mg IM x 2 and Solumedrol 125 mg IM x 1. Was seen by NP on Monday 6/26 as symptoms were starting to return. Pt c/o "trouble breathing.". She reports chronic productive cough. C/o mild abdominal pain but currently has dx of C-Diff. Here patient seems confused so difficult to obtain accurate subjective and tells me she doesn't know why she is here.   Per sister/Stephanie Doyle  patient has had poor quality of life since initial event in 2015, she has had continued slow physical, functional and cognitive decline.  Family doesn't want to "see her suffer."  HPOA is faced with advanced directive decision and anticipatory care needs  Clinical Assessment and Goals of Care:   This NP Stephanie Doyle reviewed medical records, received report from team, assessed the patient and then spoke with patient's sister/HPOA by telephone at the patient's bedside regrding diagnosis, prognosis, GOC, EOL wishes disposition and options.   A detailed  discussion was had today regarding advanced directives.  Concepts specific to code status, artifical feeding and hydration, continued IV antibiotics and rehospitalization was had.  The difference between a aggressive medical intervention path  and a palliative comfort care path for this patient at this time was had.  Values and goals of care important to patient and family were attempted to be elicited.  Concept of Hospice and Palliative Care were discussed    Questions and concerns addressed.  Family encouraged to call with questions or concerns.  PMT will continue to support holistically.  HPOA/Stephanie Doyle understands the overall poor prognosis.  She remains hopeful for improvement and for today wishes to continue to treat the treatable.  If the patient does not begin to respond and improve, she realizes the important advanced directive decisions she faces.  Stephanie Doyle will meet me tomorrow at 1200 to further clarify GOC   SUMMARY OF RECOMMENDATIONS    Code Status/Advance Care Planning:  DNR   Continue to treat the treatable   Palliative Prophylaxis:   Aspiration, Bowel Regimen, Frequent Pain Assessment and Oral Care   Psycho-social/Spiritual:   Desire for further Chaplaincy support:no- strong community church support  Additional Recommendations: Education on Hospice  Prognosis:  Poor- dependant on desire for life prolonging measures  Discharge Planning: To Be Determined      Primary Diagnoses: Present on Admission:  . Sepsis (HCC)  I have reviewed the medical record, interviewed the patient and family, and examined the patient. The following aspects are pertinent.  Past Medical History  Diagnosis Date  . COPD (chronic obstructive pulmonary disease) (HCC)    Social History   Social History  . Marital Status: Single    Spouse Name: N/A  . Number of Children: N/A  . Years of Education: N/A   Social History Main Topics  . Smoking status: Never Smoker   . Smokeless  tobacco: None  . Alcohol Use: No  . Drug Use: None  . Sexual Activity: Not Asked   Other Topics Concern  . None   Social History Narrative   History reviewed. No pertinent family history. Scheduled Meds: . antiseptic oral rinse  7 mL Mouth Rinse q12n4p  . ceFEPime (MAXIPIME) IV  1 g Intravenous Q8H  . chlorhexidine  15 mL Mouth Rinse BID  . clonazePAM  0.5 mg Oral BID  . enoxaparin (LOVENOX) injection  40 mg Subcutaneous Q24H  . free water  200 mL Per Tube Q8H  . insulin aspart  0-15 Units Subcutaneous Q4H  . insulin glargine  7 Units Subcutaneous QHS  . ipratropium-albuterol  3 mL Inhalation QID  . levETIRAcetam  500 mg Oral BID  . levothyroxine  25 mcg Oral QAC breakfast  . metoprolol  50 mg Oral BID  . metronidazole  500 mg Intravenous Q8H  . multivitamin with minerals  1 tablet Oral Daily  . predniSONE  5 mg Oral Q breakfast  . rosuvastatin  20 mg Oral Q24H  . saccharomyces boulardii  250 mg Oral BID  . sodium chloride flush  3 mL Intravenous Q12H  . vancomycin  500 mg Oral Q6H   Continuous Infusions: . sodium chloride 75 mL/hr at 03/21/16 0900  . feeding supplement (GLUCERNA 1.2 CAL) 1,000 mL (03/21/16 0900)   PRN Meds:.acetaminophen **OR** acetaminophen, HYDROcodone-acetaminophen, levalbuterol, ondansetron **OR** ondansetron (ZOFRAN) IV, traZODone Medications Prior to Admission:  Prior to Admission medications   Medication Sig Start Date End Date Taking? Authorizing Provider  acetaminophen (TYLENOL) 325 MG tablet Take 650 mg by mouth every 4 (four) hours as needed.   Yes Historical Provider, MD  albuterol-ipratropium (COMBIVENT) 18-103 MCG/ACT inhaler Inhale 1 puff into the lungs 4 (four) times daily.   Yes Historical Provider, MD  antiseptic oral rinse (BIOTENE) LIQD 15 mLs by Mouth Rinse route 4 (four) times daily. Rinse for 30 seconds and then spit out   Yes Historical Provider, MD  clonazePAM (KLONOPIN) 0.5 MG tablet Take 0.5 mg by mouth 2 (two) times daily.   Yes  Historical Provider, MD  diphenhydrAMINE (BENADRYL) 25 mg capsule 25 mg by Gastric Tube route every 4 (four) hours as needed for itching.   Yes Historical Provider, MD  famotidine (PEPCID) 20 MG tablet Take 20 mg by mouth 2 (two) times daily.   Yes Historical Provider, MD  hydrocortisone cream 1 % Apply 1 application topically 4 (four) times daily as needed for itching. Apply a thin film to clean, dry hemorrhoids four times a day as needed   Yes Historical Provider, MD  ipratropium-albuterol (DUONEB) 0.5-2.5 (3) MG/3ML SOLN Take 3 mLs by nebulization 3 (three) times daily.   Yes Historical Provider, MD  levalbuterol (XOPENEX) 1.25 MG/3ML nebulizer solution Take 1.25 mg by nebulization 3 (three) times daily.  Yes Historical Provider, MD  levalbuterol (XOPENEX) 1.25 MG/3ML nebulizer solution Take 1.25 mg by nebulization every 4 (four) hours as needed for wheezing.   Yes Historical Provider, MD  levETIRAcetam (KEPPRA) 500 MG tablet Take 500 mg by mouth 2 (two) times daily.   Yes Historical Provider, MD  levothyroxine (SYNTHROID, LEVOTHROID) 25 MCG tablet Take 25 mcg by mouth daily before breakfast.   Yes Historical Provider, MD  Linezolid in Sodium Chloride 600-0.9 MG/300ML-% SOLN Inject 300 mLs into the vein every 12 (twelve) hours. 03/19/16 03/26/16 Yes Historical Provider, MD  metoprolol (LOPRESSOR) 50 MG tablet Take 50 mg by mouth 2 (two) times daily.   Yes Historical Provider, MD  metroNIDAZOLE (FLAGYL) 500 MG tablet Take 500 mg by mouth every 8 (eight) hours. 03/13/16 03/23/16 Yes Historical Provider, MD  morphine (ROXANOL) 20 MG/ML concentrated solution Take 5-10 mg by mouth every hour as needed for severe pain.   Yes Historical Provider, MD  Multiple Vitamin (MULTIVITAMIN WITH MINERALS) TABS tablet Take 1 tablet by mouth daily.   Yes Historical Provider, MD  Nutritional Supplements (FEEDING SUPPLEMENT, JEVITY 1.5 CAL,) LIQD 237 mLs by Gastric Tube route 4 (four) times daily.   Yes Historical Provider,  MD  Nutritional Supplements (FEEDING SUPPLEMENT, JEVITY 1.5 CAL,) LIQD 237 mLs by Gastric Tube route daily. (extra feeding is for Blood Glucose support)   Yes Historical Provider, MD  predniSONE (DELTASONE) 5 MG tablet Take 5 mg by mouth daily with breakfast.   Yes Historical Provider, MD  rosuvastatin (CRESTOR) 20 MG tablet Take 20 mg by mouth daily.   Yes Historical Provider, MD  saccharomyces boulardii (FLORASTOR) 250 MG capsule Take 250 mg by mouth 2 (two) times daily.   Yes Historical Provider, MD  sodium chloride 0.9 % infusion Inject 50 mLs into the vein continuous. Give at 6750ml/hour for 48 hours for hyponatremia 03/19/16 03/21/16 Yes Historical Provider, MD  zinc oxide (BALMEX) 11.3 % CREA cream Apply 1 application topically as needed. Apply liberal amount to areas of skin irritation as needed   Yes Historical Provider, MD  zinc oxide 20 % ointment Apply 1 application topically 2 (two) times daily as needed for irritation. Apply a thin film to bottom twice daily and as needed   Yes Historical Provider, MD   Allergies  Allergen Reactions  . Augmentin [Amoxicillin-Pot Clavulanate] Other (See Comments)    Reaction: unknown  . Vancomycin     Pt had red rash all over body after vanc was given, this was told to me in report but had not been added to the allergy list    Review of Systems  Unable to perform ROS: Mental status change    Physical Exam  Constitutional: She appears lethargic. She appears ill. Nasal cannula in place.  HENT:  Mouth/Throat: Mucous membranes are dry.  Cardiovascular: Tachycardia present.   Pulmonary/Chest: She has decreased breath sounds.  Neurological: She appears lethargic.  Skin: Skin is warm and dry.    Vital Signs: BP 96/53 mmHg  Pulse 94  Temp(Src) 97.5 F (36.4 C) (Oral)  Resp 26  Ht 5' (1.524 m)  Wt 66.6 kg (146 lb 13.2 oz)  BMI 28.68 kg/m2  SpO2 100% Pain Assessment: No/denies pain POSS *See Group Information*: 1-Acceptable,Awake and  alert Pain Score: 0-No pain   SpO2: SpO2: 100 % O2 Device:SpO2: 100 % O2 Flow Rate: .O2 Flow Rate (L/min): 1 L/min  IO: Intake/output summary:  Intake/Output Summary (Last 24 hours) at 03/21/16 1150 Last data filed at 03/21/16  0900  Gross per 24 hour  Intake 3701.59 ml  Output      0 ml  Net 3701.59 ml    LBM: Last BM Date: 03/21/16 Baseline Weight: Weight: 47.628 kg (105 lb) Most recent weight: Weight: 66.6 kg (146 lb 13.2 oz)     Palliative Assessment/Data:  20%   Flowsheet Rows        Most Recent Value   Intake Tab    Referral Department  Hospitalist   Unit at Time of Referral  ICU   Palliative Care Primary Diagnosis  Neurology   Date Notified  03/20/16   Palliative Care Type  New Palliative care   Reason for referral  Clarify Goals of Care   Date of Admission  03/20/16   # of days IP prior to Palliative referral  0   Clinical Assessment    Psychosocial & Spiritual Assessment    Palliative Care Outcomes      Discussed with DR Sherryll BurgerShah  Time In: 1030 Time Out: 1145 Time Total: 75 min Greater than 50%  of this time was spent counseling and coordinating care related to the above assessment and plan.  Signed by: Stephanie CreedLARACH, Fabricio Endsley, NP   Please contact Palliative Medicine Team phone at [phone] for questions and concerns.  For individual provider: See Loretha StaplerAmion

## 2016-03-21 NOTE — [place] FL2 (Signed)
[place] MEDICAID FL2 LEVEL OF CARE SCREENING TOOL     IDENTIFICATION  Patient Name: Stephanie SilvasVicky D Boardley Birthdate: 07/15/1951 Sex: female Admission Date (Current Location): 03/20/2016  [place]   Facility and Address:  [facility], [address], [place], [place] [address]      Provider Number: [id number]  Attending Physician Name and Address:  Delfino LovettVipul Shah, MD  Relative Name and Phone Number:       Current Level of Care: Hospital Recommended Level of Care: Skilled Nursing Facility Prior Approval Number:    Date Approved/Denied:   PASRR Number: [id number]  Discharge Plan: SNF    Current Diagnoses: Patient Active Problem List   Diagnosis Date Noted  . Palliative care encounter 03/21/2016  . DNR (do not resuscitate) 03/21/2016  . Sepsis (HCC) 03/20/2016  . Dyspnea   . Metabolic encephalopathy   . Clostridium difficile diarrhea   . Hyperglycemia   . Tracheostomy status (HCC)   . Hypernatremia 08/19/2014  . Acute respiratory failure with hypoxia (HCC)   . HCAP (healthcare-associated pneumonia)   . Acute on chronic respiratory failure (HCC) 08/11/2014  . Fungemia 08/11/2014  . Hypomagnesemia 08/01/2014  . Hypophosphatemia 08/01/2014  . Subarachnoid hemorrhage (HCC)   . Acute respiratory failure (HCC)   . Absolute anemia   . COPD, moderate (HCC) 05/18/2014    Orientation RESPIRATION BLADDER Height & Weight     Self  Normal Incontinent Weight: 146 lb 13.2 oz (66.6 kg) Height:  5' (152.4 cm)  BEHAVIORAL SYMPTOMS/MOOD NEUROLOGICAL BOWEL NUTRITION STATUS      Incontinent Diet (clear liquid diet)  AMBULATORY STATUS COMMUNICATION OF NEEDS Skin   Extensive Assist Verbally Normal                       Personal Care Assistance Level of Assistance  Bathing, Feeding, Dressing Bathing Assistance: Maximum assistance Feeding assistance: Limited assistance Dressing Assistance: Maximum assistance     Functional  Limitations Info  Sight, Hearing, Speech Sight Info: Adequate Hearing Info: Adequate Speech Info: Adequate    SPECIAL CARE FACTORS FREQUENCY                       Contractures      Additional Factors Info  Code Status, Allergies Code Status Info: DNR Allergies Info: Augmentin, Vancomycin           Current Medications (03/21/2016):  This is the current hospital active medication list Current Facility-Administered Medications  Medication Dose Route Frequency Provider Last Rate Last Dose  . 0.9 %  sodium chloride infusion   Intravenous Continuous [name], MD 10 mL/hr at 03/21/16 1500    . acetaminophen (TYLENOL) tablet 650 mg  650 mg Oral Q6H PRN Delfino LovettVipul Shah, MD       Or  . acetaminophen (TYLENOL) suppository 650 mg  650 mg Rectal Q6H PRN Delfino LovettVipul Shah, MD      . antiseptic oral rinse (CPC / CETYLPYRIDINIUM CHLORIDE 0.05%) solution 7 mL  7 mL Mouth Rinse q12n4p Vipul Shah, MD   7 mL at 03/21/16 1600  . [START ON 03/22/2016] azithromycin (ZITHROMAX) 250 mg in dextrose 5 % 125 mL IVPB  250 mg Intravenous Q24H Vipul Shah, MD      . chlorhexidine (PERIDEX) 0.12 % solution 15 mL  15 mL Mouth Rinse BID Vipul Shah, MD   15 mL at 03/21/16 1400  . clonazePAM (KLONOPIN) tablet 0.5 mg  0.5 mg Oral  BID Delfino LovettVipul Shah, MD   0.5 mg at 03/21/16 [id number]  . enoxaparin (LOVENOX) injection 40 mg  40 mg Subcutaneous Q24H Vishal Mungal, MD   40 mg at 03/20/16 1730  . feeding supplement (GLUCERNA 1.2 CAL) liquid 1,000 mL  1,000 mL Per Tube Continuous Vipul Shah, MD 60 mL/hr at 03/21/16 1500 1,000 mL at 03/21/16 1500  . free water 200 mL  200 mL Per Tube Q8H Vipul Sherryll BurgerShah, MD   200 mL at 03/21/16 1400  . HYDROcodone-acetaminophen (NORCO/VICODIN) 5-325 MG per tablet 1-2 tablet  1-2 tablet Oral Q4H PRN Delfino LovettVipul Shah, MD      . insulin aspart (novoLOG) injection 0-15 Units  0-15 Units Subcutaneous Q4H [name], MD   5 Units at 03/21/16 1626  . insulin glargine (LANTUS) injection 7 Units  7 Units  Subcutaneous QHS Vipul Shah, MD      . ipratropium-albuterol (DUONEB) 0.5-2.5 (3) MG/3ML nebulizer solution 3 mL  3 mL Inhalation QID Delfino LovettVipul Shah, MD   3 mL at 03/21/16 1542  . levalbuterol (XOPENEX) nebulizer solution 1.25 mg  1.25 mg Nebulization Q4H PRN Delfino LovettVipul Shah, MD      . levETIRAcetam (KEPPRA) tablet 500 mg  500 mg Oral BID Delfino LovettVipul Shah, MD   500 mg at 03/21/16 0929  . levothyroxine (SYNTHROID, LEVOTHROID) tablet 25 mcg  25 mcg Oral QAC breakfast Delfino LovettVipul Shah, MD   25 mcg at 03/21/16 [id number]  . metoprolol (LOPRESSOR) tablet 50 mg  50 mg Oral BID Delfino LovettVipul Shah, MD   50 mg at 03/21/16 0928  . multivitamin with minerals tablet 1 tablet  1 tablet Oral Daily Delfino LovettVipul Shah, MD   1 tablet at 03/21/16 0929  . ondansetron (ZOFRAN) tablet 4 mg  4 mg Oral Q6H PRN Delfino LovettVipul Shah, MD       Or  . ondansetron (ZOFRAN) injection 4 mg  4 mg Intravenous Q6H PRN Vipul Sherryll BurgerShah, MD      . predniSONE (DELTASONE) tablet 5 mg  5 mg Oral Q breakfast Delfino LovettVipul Shah, MD   5 mg at 03/21/16 [id number]  . rosuvastatin (CRESTOR) tablet 20 mg  20 mg Oral Q24H Delfino LovettVipul Shah, MD   20 mg at 03/20/16 2007  . saccharomyces boulardii (FLORASTOR) capsule 250 mg  250 mg Oral BID Delfino LovettVipul Shah, MD   250 mg at 03/21/16 0929  . sodium chloride flush (NS) 0.9 % injection 3 mL  3 mL Intravenous Q12H Vipul Shah, MD   3 mL at 03/21/16 1000  . traZODone (DESYREL) tablet 25 mg  25 mg Oral QHS PRN Delfino LovettVipul Shah, MD      . vancomycin (VANCOCIN) 50 mg/mL oral solution 500 mg  500 mg Oral Q6H [name], MD   500 mg at 03/21/16 1400     Discharge Medications: Please see discharge summary for a list of discharge medications.  Relevant Imaging Results:  Relevant Lab Results:   Additional Information SSN:  [id number]  Dede QuerySarah Mykah Shin, LCSW

## 2016-03-21 NOTE — Progress Notes (Signed)
Report called to Stephanie Doyle on 1C. Pt in stable condition on 1L O2. Pt is still oriented to self, remains disoriented to place, time, and situation.  Pt transported in stable condition.

## 2016-03-21 NOTE — Progress Notes (Signed)
[facility] Critical Care Medicine Progess Note    ASSESSMENT/PLAN     DISCUSSION: 65 year old female with COPD, subarachnoid hemorrhage now admitted with C.dif with sepsis  ASSESSMENT / PLAN:  PULMONARY A: History of COPD, Currently stable, sat is 97% on 1 L nasal cannula. Acute bronchitis. History of respiratory failure S/P trach, decanulated Chest x-ray images and CT chest images from 6/27 reviewed: Mild atelectasis of the medial segment of the right middle lobe P:  Continue to support with oxygen to keep sats greater than 88% Patient is a DO NOT RESUSCITATE Change Solu-Cortef to oral prednisone taper. Continue bronchodilators Flutter valve/ incentive spirometer -De-escalate broad-spectrum antibiotics to azithromycin for acute bronchitis. -Will need outpatient pulmonary follow-up in regards to right middle lobe atelectasis, may need repeat imaging, and/or bronchoscopy outpatient.  CARDIOVASCULAR A:  Septic shock Hyperlipidemia P:  Continuous telemetry Keep map goals> 65 Hold metoprolol Continue Crestor  RENAL A:  No active issues Hypoalbuminemia P:  Replace electrolytes per ICU protocol  GASTROINTESTINAL A:  C. difficile P:  Enteric precautions placed Initiate tube feedings Continue Pepcid flagyl  HEMATOLOGIC A:  No active issues P:  SCDs Heparin for DVT prophylaxis Transfuse if Hgb <7  INFECTIOUS A:  Leukocytosis Doubt pneumonia Status post Septic shock  C. difficile history of fungemia? Lung Vs GI  P:  De-escalate antibiotics Continue Flagyl and oral vancomycin for C. difficile CBC in a.m.  CULTURES: 6/27 blood culture>> negative thus far 6/27 urine culture>> negative 6/27 sputum culture>>-- 6/20 C. difficile positive>> 6/27, MRSA screening PCR negative  ANTIBIOTICS: 6/27 gentamicin >6/27 6/27 cefepime> 6/27 6/27 Aztreonam>>  ENDOCRINE A:  Hyperglycemia Hypothyroidism P:  Insulin; transitioned  from infusion to levemir, continue to adjust as needed. PT was previously on IV steroids, which have been stopped.  Blood sugar checks every 4 hours Continue Synthroid  NEUROLOGIC A:  History of seizures History of depression P:  RASS goal: 0 Continue Keppra continue Zoloft     SIGNIFICANT EVENTS: 6/27 patient admitted to the ICU with sepsis related to C. difficile .  LINES/TUBES: none  STUDIES:  6/27 CT chest>>. Advanced bronchitis with diffuse airway narrowing and chronicright middle lobe collapse. No consolidating pneumonia.. Motion degraded study without evidence of pulmonary embolism.. Trace pleural effusions. ---------------------------------------   ----------------------------------------   Name: ALDORA PERMAN MRN: [id number] DOB: 1951-04-28    ADMISSION DATE:  03/20/2016    SUBJECTIVE:  No new complaints today.   Review of Systems:  Constitutional: Feels well. Cardiovascular: No chest pain.  Pulmonary: Denies dyspnea.   The remainder of systems were reviewed and were found to be negative other than what is documented in the HPI.    VITAL SIGNS: Temp:  [96.1 F (35.6 C)-98.9 F (37.2 C)] 97.5 F (36.4 C) (06/28 0800) Pulse Rate:  [91-117] 94 (06/28 1000) Resp:  [22-34] 26 (06/28 1000) BP: (90-121)/(49-73) 96/53 mmHg (06/28 1000) SpO2:  [92 %-100 %] 100 % (06/28 1129) FiO2 (%):  [28 %] 28 % (06/27 1519) Weight:  [146 lb 13.2 oz (66.6 kg)] 146 lb 13.2 oz (66.6 kg) (06/28 0348) HEMODYNAMICS:   VENTILATOR SETTINGS: Vent Mode:  [-]  FiO2 (%):  [28 %] 28 % INTAKE / OUTPUT:  Intake/Output Summary (Last 24 hours) at 03/21/16 1134 Last data filed at 03/21/16 0900  Gross per 24 hour  Intake 3701.59 ml  Output      0 ml  Net 3701.59 ml    PHYSICAL EXAMINATION: Physical Examination:   VS: BP 96/53 mmHg  Pulse  94  Temp(Src) 97.5 F (36.4 C) (Oral)  Resp 26  Ht 5' (1.524 m)  Wt 146 lb 13.2 oz (66.6 kg)  BMI 28.68 kg/m2  SpO2 100%    General Appearance: No distress  Neuro:without focal findings, mental status normal. HEENT: PERRLA, EOM intact. Pulmonary: normal breath sounds   CardiovascularNormal S1,S2.  No m/r/g.   Abdomen: Benign, Soft, non-tender. Renal:  No costovertebral tenderness  GU:  Not performed at this time. Endocrine: No evident thyromegaly. Skin:   warm, no rashes, no ecchymosis  Extremities: normal, no cyanosis, clubbing.   LABS:   LABORATORY PANEL:   CBC  Recent Labs Lab 03/21/16 0415  WBC 48.1*  HGB 10.7*  HCT 33.8*  PLT 277    Chemistries   Recent Labs Lab 03/20/16 1112 03/21/16 0415  NA 146* 145  K 4.7 4.1  CL 104 111  CO2 35* 32  GLUCOSE 398* 135*  BUN 28* 28*  CREATININE 0.85 0.61  CALCIUM 8.0* 7.7*  AST 24  --   ALT 31  --   ALKPHOS 116  --   BILITOT 0.2*  --      Recent Labs Lab 03/20/16 2327 03/21/16 0032 03/21/16 0141 03/21/16 0323 03/21/16 0457 03/21/16 0756  GLUCAP 153* 147* 147* 120* 129* 197*   No results for input(s): PHART, PCO2ART, PO2ART in the last 168 hours.  Recent Labs Lab 03/20/16 0634 03/20/16 1112  AST 30 24  ALT 35 31  ALKPHOS 131* 116  BILITOT <0.1* 0.2*  ALBUMIN 2.2* 2.1*    Cardiac Enzymes  Recent Labs Lab 03/20/16 1112  TROPONINI 0.24*    RADIOLOGY:  Ct Angio Chest Pe W/cm &/or Wo Cm  03/20/2016  CLINICAL DATA:  Tachycardia, tachypnea, and dyspnea. EXAM: CT ANGIOGRAPHY CHEST WITH CONTRAST TECHNIQUE: Multidetector CT imaging of the chest was performed using the standard protocol during bolus administration of intravenous contrast. Multiplanar CT image reconstructions and MIPs were obtained to evaluate the vascular anatomy. CONTRAST:  75 cc Isovue 370 intravenous COMPARISON:  05/10/2014 FINDINGS: Cardiovascular: Normal heart size. No pericardial effusion. Atherosclerosis, including along the LAD. When allowing for intermittent motion artifact there is no indication of acute pulmonary embolism. No acute aortic finding.  Mediastinum:  Negative for adenopathy. Lungs/Pleura: Extensive airway thickening with segmental narrowing or collapse. There is chronic collapse of the right middle lobe. Heterogeneous density of the apical lungs likely from air trapping, similar pattern seen on comparison exam. No suspicious nodularity when compared to prior; small clusters of pulmonary nodules are stable to decreased. Trace pleural effusions. Upper abdomen: No acute findings. Musculoskeletal: Mild body wall edema, symmetric. No acute osseous finding. Review of the MIP images confirms the above findings. IMPRESSION: 1. Advanced bronchitis with diffuse airway narrowing and chronic right middle lobe collapse. No consolidating pneumonia. 2. Motion degraded study without evidence of pulmonary embolism. 3. Trace pleural effusions. Electronically Signed   By: Marnee SpringJonathon  Watts M.D.   On: 03/20/2016 09:34   Dg Chest Port 1 View  03/20/2016  CLINICAL DATA:  Tachypnea, tachycardia, sepsis. EXAM: PORTABLE CHEST 1 VIEW COMPARISON:  CT and plain film 03/20/2016 FINDINGS: Medial right infrahilar airspace disease again noted seen on prior plain films and CT compatible with right middle lobe collapse. Bibasilar atelectasis. Heart is normal size. No effusions or acute bony abnormality. No change since prior study. IMPRESSION: Continued right middle lobe collapse.  Bibasilar atelectasis. No change since prior study. Electronically Signed   By: Charlett NoseKevin  Dover M.D.   On: 03/20/2016 11:02  Dg Chest Port 1 View  03/20/2016  CLINICAL DATA:  Tachypnea and tachycardia EXAM: PORTABLE CHEST 1 VIEW COMPARISON:  September 02, 2014 FINDINGS: Tracheostomy catheter no longer present. There is airspace consolidation with volume loss in the right middle lobe adjacent to the right heart border. Lungs elsewhere clear. Heart size and pulmonary vascularity are normal. No adenopathy. There is calcification in the aortic arch region. IMPRESSION: Right middle lobe airspace opacity,  likely pneumonia. Lungs elsewhere clear. Cardiac silhouette within normal limits. Aortic atherosclerosis. Electronically Signed   By: Bretta BangWilliam  Woodruff III M.D.   On: 03/20/2016 07:19       --Wells Guileseep Ulysse Siemen, MD.  ICU Pager: [phone] [facility] Office Number: [phone]  Santiago Gladavid Kasa, M.D.  Stephanie AcreVishal Mungal, M.D.  Billy Fischeravid Simonds, M.D  03/21/2016   Critical Care Attestation.  I have personally obtained a history, examined the patient, evaluated laboratory and imaging results, formulated the assessment and plan and placed orders. The Patient requires high complexity decision making for assessment and support, frequent evaluation and titration of therapies, application of advanced monitoring technologies and extensive interpretation of multiple databases. The patient has critical illness that could lead imminently to failure of 1 or more organ systems and requires the highest level of physician preparedness to intervene.  Critical Care Time devoted to patient care services described in this note is 45 minutes and is exclusive of time spent in procedures.

## 2016-03-21 NOTE — Progress Notes (Addendum)
Inpatient Diabetes Program Recommendations  AACE/ADA: New Consensus Statement on Inpatient Glycemic Control (2015)  Target Ranges:  Prepandial:   less than 140 mg/dL      Peak postprandial:   less than 180 mg/dL (1-2 hours)      Critically ill patients:  140 - 180 mg/dL  Results for Stephanie Doyle, Stephanie Doyle (MRN [id number]) as of 03/21/2016 06:40  Ref. Range 03/20/2016 20:24 03/20/2016 21:20 03/20/2016 22:29 03/20/2016 23:27 03/21/2016 00:32 03/21/2016 01:41 03/21/2016 03:23 03/21/2016 04:57  Glucose-Capillary Latest Ref Range: 65-99 mg/dL 782132 (H) 956127 (H) 213130 (H) 153 (H) 147 (H) 147 (H) 120 (H) 129 (H)  Results for Stephanie Doyle, Stephanie Doyle (MRN [id number]) as of 03/21/2016 06:40  Ref. Range 03/20/2016 06:34 03/20/2016 10:31  Hemoglobin A1C Latest Ref Range: 4.0-6.0 %  7.5 (H)  Glucose Latest Ref Range: 65-99 mg/dL 629558 (HH)     Review of Glycemic Control  Diabetes history: No Outpatient Diabetes medications: NA Current orders for Inpatient glycemic control: Novolog 0-15 units TID with meals, Novolog 0-5 units QHS  Inpatient Diabetes Program Recommendations: Insulin - Basal: Noted patient was taken off GlucoStabilizer around 5 am today and no basal insulin was given. Please consider ordering Lantus 7 units Q24H starting now (based on 66 kg x 0.1 units). Correction (SSI): Patient is not ordered a diet and has a PEG tube in which she is receiving Glucerna @ 60 ml/hr.  Please consider decreasing Novolog correction to sensitive scale and changing frequency to Q4H.  Thanks, Stephanie PennerMarie Amarya Kuehl, RN, MSN, CDE Diabetes Coordinator Inpatient Diabetes Program [phone] (Team Pager from 8am to 5pm) [phone] ([facility] office) [phone] [facility] office) [phone] [facility] office)

## 2016-03-21 NOTE — Progress Notes (Addendum)
[facility] - [place] [facility]   PATIENT NAME: Stephanie Doyle    MR#:  [id number]  DATE OF BIRTH:  1951/01/13  SUBJECTIVE:  CHIEF COMPLAINT:   Chief Complaint  Patient presents with  . Shortness of Breath    REVIEW OF SYSTEMS:  Review of Systems  Constitutional: Negative for fever, weight loss, malaise/fatigue and diaphoresis.  HENT: Negative for ear discharge, ear pain, hearing loss, nosebleeds, sore throat and tinnitus.   Eyes: Negative for blurred vision and pain.  Respiratory: Negative for cough, hemoptysis, shortness of breath and wheezing.   Cardiovascular: Negative for chest pain, palpitations, orthopnea and leg swelling.  Gastrointestinal: Negative for heartburn, nausea, vomiting, abdominal pain, diarrhea, constipation and blood in stool.  Genitourinary: Negative for dysuria, urgency and frequency.  Musculoskeletal: Negative for myalgias and back pain.  Skin: Negative for itching and rash.  Neurological: Negative for dizziness, tingling, tremors, focal weakness, seizures, weakness and headaches.  Psychiatric/Behavioral: Negative for depression. The patient is not nervous/anxious.     DRUG ALLERGIES:   Allergies  Allergen Reactions  . Augmentin [Amoxicillin-Pot Clavulanate] Other (See Comments)    Reaction: unknown  . Vancomycin     Pt had red rash all over body after vanc was given, this was told to me in report but had not been added to the allergy list    VITALS:  Blood pressure 109/63, pulse 108, temperature 97.5 F (36.4 C), temperature source Oral, resp. rate 35, height 5' (1.524 m), weight 66.6 kg (146 lb 13.2 oz), SpO2 96 %. PHYSICAL EXAMINATION:  Physical Exam  Constitutional: She is oriented to person, place, and time and well-developed, well-nourished, and in no distress.  HENT:  Head: Normocephalic and atraumatic.  Eyes: Conjunctivae and EOM are normal. Pupils are equal, round, and reactive to light.  Neck: Normal range of  motion. Neck supple. No tracheal deviation present. No thyromegaly present.  Cardiovascular: Normal rate, regular rhythm and normal heart sounds.   Pulmonary/Chest: Effort normal. No respiratory distress. She has decreased breath sounds in the right lower field and the left lower field. She has no wheezes. She exhibits no tenderness.  Abdominal: Soft. Bowel sounds are normal. She exhibits no distension. There is no tenderness.  Musculoskeletal: Normal range of motion.  Neurological: She is alert and oriented to person, place, and time. No cranial nerve deficit.  Skin: Skin is warm and dry. No rash noted.  Psychiatric: Mood and affect normal.   LABORATORY PANEL:   CBC  Recent Labs Lab 03/21/16 0415  WBC 48.1*  HGB 10.7*  HCT 33.8*  PLT 277   ------------------------------------------------------------------------------------------------------------------ Chemistries   Recent Labs Lab 03/20/16 1112 03/21/16 0415  NA 146* 145  K 4.7 4.1  CL 104 111  CO2 35* 32  GLUCOSE 398* 135*  BUN 28* 28*  CREATININE 0.85 0.61  CALCIUM 8.0* 7.7*  AST 24  --   ALT 31  --   ALKPHOS 116  --   BILITOT 0.2*  --    RADIOLOGY:  No results found. ASSESSMENT AND PLAN:  65 year old female with a known history of subarachnoid hemorrhage requiring craniotomy in November 2015, status post PEG tube placement requiring tube feeding for nutrition, tracheostomy (reversed now) [facility] over a [facility]).  * Sepsis - likely due to c.diff - improving leukocytosis - on PO vanco and zithromax  * pneumonia - ruled out. 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 Management plans discussed with the patient, family and they are in agreement.  CODE STATUS: DNR  TOTAL TIME TAKING CARE OF THIS PATIENT: 35 minutes.   More than 50% of the time was spent in counseling/coordination of care: YES  POSSIBLE D/C IN 2-3 DAYS, DEPENDING ON CLINICAL CONDITION.   [facility], Jaklyn Alen M.D on 03/21/2016 at 5:51 PM  Between 7am to 6pm - Pager - [phone]  After 6pm go to [email or web address] - Social research officer, governmentpassword EPAS [facility]  [facility] [place] [facility]  Office  [phone]  CC: Primary care physician; Lauro RegulusANDERSON,MARSHALL W., MD  Note: This dictation was prepared with Dragon dictation along with smaller phrase technology. Any transcriptional errors that result from this process are unintentional.

## 2016-03-21 NOTE — Care Management (Signed)
[facility]. Admitted with sepsis secondary to CDiff. On IV antibiotics.

## 2016-03-21 NOTE — Progress Notes (Signed)
Nutrition Follow-up  DOCUMENTATION CODES:   Not applicable  INTERVENTION:  -Recommend continuing current TF regimen; will assess  formula and ability to transition back to bolus feedings on follow  NUTRITION DIAGNOSIS:   Inadequate oral intake related to chronic illness as evidenced by NPO status.  Being addressed via TF  GOAL:   Patient will meet greater than or equal to 90% of their needs  MONITOR:   TF tolerance, Weight trends, Labs  REASON FOR ASSESSMENT:   Consult Enteral/tube feeding initiation and management  ASSESSMENT:   65 yo female admitted with possible sepsis, possible pneumonia, diarrhea with C.diff pending, hyperglycemia with FSBS >500.  Pt with hx of subarachnoid hemorrhage requiring craniotomy, pt with residual brain injury. Pt has PEG tube presently, hx of trach which was reversed  Pt tolerating Glucerna 1.2 at rate of 60 ml/hr, free water 200 mL q 8 hours, FSBS improved  Diet Order:  Diet clear liquid Room service appropriate?: Yes; Fluid consistency:: Thin  Skin:  Reviewed, no issues  Last BM:  no documented BM   Labs: sodium 145, potassium wdl  Glucose Profile:  Recent Labs  03/21/16 0323 03/21/16 0457 03/21/16 0756  GLUCAP 120* 129* 197*   Lab Results  Component Value Date   HGBA1C 7.5* 03/20/2016    Meds: NS changed to KVO during rounds, transitioned off insulin drip, ss novolog, levemir, prednisone, MVI  Height:   Ht Readings from Last 1 Encounters:  03/20/16 5' (1.524 m)    Weight:   Wt Readings from Last 1 Encounters:  03/21/16 146 lb 13.2 oz (66.6 kg)    Ideal Body Weight:     BMI:  Body mass index is 28.68 kg/(m^2).  Estimated Nutritional Needs:   Kcal:  1650-1980 kcals  Protein:  79-99 g  Fluid:  >/= 1.6 L  EDUCATION NEEDS:   No education needs identified at this time  Romelle StarcherCate Chrystle Murillo MS, RD, LDN [phone] Pager  [phone] Weekend/On-Call Pager

## 2016-03-22 LAB — CBC
HCT: 36.2 % (ref 35.0–47.0)
Hemoglobin: 11.7 g/dL — ABNORMAL LOW (ref 12.0–16.0)
MCH: 27.6 pg (ref 26.0–34.0)
MCHC: 32.2 g/dL (ref 32.0–36.0)
MCV: 85.7 fL (ref 80.0–100.0)
Platelets: 248 K/uL (ref 150–440)
RBC: 4.23 MIL/uL (ref 3.80–5.20)
RDW: 15.5 % — ABNORMAL HIGH (ref 11.5–14.5)
WBC: 26.7 K/uL — ABNORMAL HIGH (ref 3.6–11.0)

## 2016-03-22 LAB — LACTIC ACID, PLASMA: Lactic Acid, Venous: 0.8 mmol/L (ref 0.5–1.9)

## 2016-03-22 LAB — GLUCOSE, CAPILLARY
GLUCOSE-CAPILLARY: 151 mg/dL — AB (ref 65–99)
GLUCOSE-CAPILLARY: 167 mg/dL — AB (ref 65–99)
GLUCOSE-CAPILLARY: 191 mg/dL — AB (ref 65–99)
Glucose-Capillary: 149 mg/dL — ABNORMAL HIGH (ref 65–99)
Glucose-Capillary: 184 mg/dL — ABNORMAL HIGH (ref 65–99)
Glucose-Capillary: 170 mg/dL — ABNORMAL HIGH (ref 65–99)

## 2016-03-22 LAB — BASIC METABOLIC PANEL WITH GFR
Anion gap: 1 — ABNORMAL LOW (ref 5–15)
BUN: 29 mg/dL — ABNORMAL HIGH (ref 6–20)
CO2: 36 mmol/L — ABNORMAL HIGH (ref 22–32)
Calcium: 8.2 mg/dL — ABNORMAL LOW (ref 8.9–10.3)
Chloride: 108 mmol/L (ref 101–111)
Creatinine, Ser: 0.67 mg/dL (ref 0.44–1.00)
GFR calc Af Amer: >60 mL/min
GFR calc non Af Amer: >60 mL/min
Glucose, Bld: 171 mg/dL — ABNORMAL HIGH (ref 65–99)
Potassium: 4.3 mmol/L (ref 3.5–5.1)
Sodium: 145 mmol/L (ref 135–145)

## 2016-03-22 LAB — MAGNESIUM: MAGNESIUM: 2.4 mg/dL (ref 1.7–2.4)

## 2016-03-22 NOTE — Clinical Social Work Note (Signed)
Clinical Social Work Assessment  Patient Details  Name: Stephanie Doyle MRN: [id number] Date of Birth: [birth year]  Date of referral:  03/21/16               Reason for consult:  Facility Placement                Permission sought to share information with:  Family Supports Permission granted to share information::  Yes, Verbal Permission Granted  Name::     [name]  Relationship::  Sister and POA  Contact Information:  [phone]  Housing/Transportation Living arrangements for the past 2 months:   Skilled Nursing Facility Source of Information:  Power of Attorney Patient Interpreter Needed:  None Criminal Activity/Legal Involvement Pertinent to Current Situation/Hospitalization:  No - Comment as needed Significant Relationships:  Siblings Lives with:  Facility Resident Do you feel safe going back to the place where you live?  Yes Need for family participation in patient care:  Yes (Comment)  Care giving concerns:  No care giving concerns identified.   Social Worker assessment / plan:  CSW spoke with pt's sister on the phone. CSW introduced herself and explained role of social work. CSW also explained the process of returning [facility]. Pt's sister is agreeable to it as she has there almost 2 years and is a LTC pt. CSW updated facility and they will be able to accept her at discharge. CSW will continue to follow.   Employment status:  Retired Health and safety inspectornsurance information:  Armed forces operational officerMedicare, [place] Recommendations:  Not assessed at this time Information / Referral to community resources:  Other (Comment Required) ([facility])  Patient/Family's Response to care:  Pt's sister was appreciative of CSW support.   Patient/Family's Understanding of and Emotional Response to Diagnosis, Current Treatment, and Prognosis:  Pt's sister would like pt to [facility] as she is there for LTC.   Emotional Assessment Appearance:  Other (Comment  Required Attitude/Demeanor/Rapport:  Other Affect (typically observed):  Other Orientation:  Oriented to Self, Fluctuating Orientation (Suspected and/or reported Sundowners) Alcohol / Substance use:  Never Used Psych involvement (Current and /or in the community):  No (Comment)  Discharge Needs  Concerns to be addressed:  Adjustment to Illness Readmission within the last 30 days:  No Current discharge risk:  Chronically ill Barriers to Discharge:  Continued Medical Work up   [employer] RxSarah Rush Salce, LCSW 03/22/2016, 5:05 PM

## 2016-03-22 NOTE — Progress Notes (Signed)
Daily Progress Note   Patient Name: Stephanie SilvasVicky D Lineman       Date: 03/22/2016 DOB: 1951-07-02  Age: 65 y.o. MRN#: [id number] Attending Physician: Alford Highlandichard Wieting, MD Primary Care Physician: [name], MD Admit Date: 03/20/2016  Reason for Consultation/Follow-up: Establishing goals of care and Psychosocial/spiritual support  Subjective:  Meet with sister and her husband and Dr Renae GlossWieting.  Dr Renae GlossWieting updated family ion medical condition and current treatment plan  Continued  discussion was had today regarding advanced directives.  Concepts specific to code status, artifical feeding and hydration, continued IV antibiotics and rehospitalization was had.  The difference between a aggressive medical intervention path  and a palliative comfort care path for this patient at this time was had.  Values and goals of care important to patient and family were attempted to be elicited.  Concept of Hospice was discussed  Questions and concerns addressed.  Family encouraged to call with questions or concerns.  Hard Choices and MOST reviewed  HPOA/[name] understands the overall poor prognosis.  She believes that her sister is failing and doesn't believe she is going to recover.  She desires treatment plan to continue through the week-end and if the patient does not begin to respond and improve, she wishes to shift to comfort and access a hospice facility bed.  I explained PMT will be unavailable until next with Dr Renae GlossWieting  Length of Stay: 2  Current Medications: Scheduled Meds:  . antiseptic oral rinse  7 mL Mouth Rinse q12n4p  . chlorhexidine  15 mL Mouth Rinse BID  . clonazePAM  0.5 mg Oral BID  . enoxaparin (LOVENOX) injection  40 mg Subcutaneous Q24H  . free water  200 mL Per Tube Q8H  . insulin  aspart  0-15 Units Subcutaneous Q4H  . insulin glargine  7 Units Subcutaneous QHS  . ipratropium-albuterol  3 mL Inhalation QID  . levETIRAcetam  500 mg Oral BID  . levothyroxine  25 mcg Oral QAC breakfast  . metoprolol  50 mg Oral BID  . multivitamin with minerals  1 tablet Oral Daily  . predniSONE  5 mg Oral Q breakfast  . rosuvastatin  20 mg Oral Q24H  . saccharomyces boulardii  250 mg Oral BID  . sodium chloride flush  3 mL Intravenous Q12H  . vancomycin  500 mg Oral  Q6H    Continuous Infusions: . sodium chloride 10 mL/hr at 03/21/16 2156  . feeding supplement (GLUCERNA 1.2 CAL) 1,000 mL (03/21/16 1500)    PRN Meds: acetaminophen **OR** acetaminophen, HYDROcodone-acetaminophen, levalbuterol, ondansetron **OR** ondansetron (ZOFRAN) IV, traZODone  Physical Exam  Constitutional: She appears lethargic. She appears ill. Nasal cannula in place.  HENT:  Mouth/Throat: Mucous membranes are dry.  Cardiovascular: Tachycardia present.   Pulmonary/Chest: She has decreased breath sounds.  Neurological: She appears lethargic.  Skin: Skin is warm and dry.            Vital Signs: BP 120/60 mmHg  Pulse 124  Temp(Src) 98.2 F (36.8 C) (Oral)  Resp 18  Ht 5' (1.524 m)  Wt 70.308 kg (155 lb)  BMI 30.27 kg/m2  SpO2 92% SpO2: SpO2: 92 % O2 Device: O2 Device: Nasal Cannula O2 Flow Rate: O2 Flow Rate (L/min): 1 L/min  Intake/output summary:  Intake/Output Summary (Last 24 hours) at 03/22/16 1426 Last data filed at 03/21/16 1500  Gross per 24 hour  Intake     70 ml  Output      0 ml  Net     70 ml   LBM: Last BM Date: 03/22/16 Baseline Weight: Weight: 47.628 kg (105 lb) Most recent weight: Weight: 70.308 kg (155 lb)       Palliative Assessment/Data: 20%    Flowsheet Rows        Most Recent Value   Intake Tab    Referral Department  Hospitalist   Unit at Time of Referral  ICU   Palliative Care Primary Diagnosis  Neurology   Date Notified  03/20/16   Palliative Care Type   New Palliative care   Reason for referral  Clarify Goals of Care   Date of Admission  03/20/16   # of days IP prior to Palliative referral  0   Clinical Assessment    Psychosocial & Spiritual Assessment    Palliative Care Outcomes       Patient Active Problem List   Diagnosis Date Noted  . Palliative care encounter 03/21/2016  . DNR (do not resuscitate) 03/21/2016  . Sepsis (HCC) 03/20/2016  . Dyspnea   . Metabolic encephalopathy   . Clostridium difficile diarrhea   . Hyperglycemia   . Tracheostomy status (HCC)   . Hypernatremia 08/19/2014  . Acute respiratory failure with hypoxia (HCC)   . HCAP (healthcare-associated pneumonia)   . Acute on chronic respiratory failure (HCC) 08/11/2014  . Fungemia 08/11/2014  . Hypomagnesemia 08/01/2014  . Hypophosphatemia 08/01/2014  . Subarachnoid hemorrhage (HCC)   . Acute respiratory failure (HCC)   . Absolute anemia   . 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 female admitted on 03/20/2016 with a known history of SAH (07/2014) subsequently on PEG, Trach (reversed) and residual brain injury was [facility] after which she has been long term resident at [facility].   Reviewing records was also noted that she had a history of fungemia. Per Patient's sister who has health care power of attorney Patient was having diarrhea since last Thursday. Her health condition worsened last Saturday.   Over the weekend, nursing at [facility] reported tachypnea, low sats, tachycardia, lung sounds with crackles and wheezing. Sx improved after Lasix 40 mg IM x 2 and Solumedrol 125 mg IM x 1. Was seen by NP on Monday 6/26 as symptoms were starting to return. Pt c/o "trouble breathing.".  She reports chronic productive cough. C/o mild abdominal pain but currently has dx of C-Diff. Here patient seems confused so difficult to obtain accurate subjective and tells me she doesn't know why she is  here.   Per sister/[name] patient has had poor quality of life since initial event in 2015, she has had continued slow physical, functional and cognitive decline. Family doesn't want to "see her suffer."  HPOA is faced with advanced directive decision and anticipatory care needs     Code Status:    Code Status Orders        Start     Ordered   03/20/16 1043  Do not attempt resuscitation (DNR)   Continuous    Question Answer Comment  In the event of cardiac or respiratory ARREST Do not call a "code blue"   In the event of cardiac or respiratory ARREST Do not perform Intubation, CPR, defibrillation or ACLS   In the event of cardiac or respiratory ARREST Use medication by any route, position, wound care, and other measures to relive pain and suffering. May use oxygen, suction and manual treatment of airway obstruction as needed for comfort.      03/20/16 1042    Code Status History    Date Active Date Inactive Code Status Order ID Comments User Context   03/20/2016 10:29 AM 03/20/2016 10:42 AM Full Code [id number]  Delfino LovettVipul Shah, MD Inpatient   08/26/2014  5:12 PM 09/23/2014  5:41 PM Full Code [id number]  Leane Parapal Moore, CNA Inpatient   07/26/2014  7:55 PM 08/26/2014  5:12 PM Full Code [id number]  Coletta MemosKyle Cabbell, MD Inpatient       Prognosis:   < 4 weeks  Discharge Planning:  To Be Determined  Care plan was discussed with Dr Renae GlossWieting  Thank you for allowing the Palliative Medicine Team to assist in the care of this patient.   Time In: 1200 Time Out: 1245 Total Time 45 min Prolonged Time Billed  no       Greater than 50%  of this time was spent counseling and coordinating care related to the above assessment and plan.  Lorinda CreedLARACH, MARY, NP  Please contact Palliative Medicine Team phone at [phone] for questions and concerns.

## 2016-03-22 NOTE — Progress Notes (Signed)
Inpatient Diabetes Program Recommendations  AACE/ADA: New Consensus Statement on Inpatient Glycemic Control (2015)  Target Ranges:  Prepandial:   less than 140 mg/dL      Peak postprandial:   less than 180 mg/dL (1-2 hours)      Critically ill patients:  140 - 180 mg/dL   Lab Results  Component Value Date   GLUCAP 167* 03/22/2016   HGBA1C 7.5* 03/20/2016    Review of Glycemic Control  Results for [name], Stephanie D (MRN [id number]) as of 03/22/2016 07:53  Ref. Range 03/21/2016 16:06 03/21/2016 20:49 03/22/2016 00:10 03/22/2016 03:58 03/22/2016 07:41  Glucose-Capillary Latest Ref Range: 65-99 mg/dL 409220 (H) 811189 (H) 914191 (H) 151 (H) 167 (H)     Diabetes history: No Outpatient Diabetes medications: NA Current orders for Inpatient glycemic control: Novolog 0-15 units q4h, Lantus 7 units qhs  Inpatient Diabetes Program Recommendations: Agree with current orders for blood sugar management.  May need to decrease correction insulin if steroids are decreased/stopped.  Susette RacerJulie Dreux Mcgroarty, RN, BA, MHA, CDE Diabetes Coordinator Inpatient Diabetes Program  [phone] (Team Pager) [phone] [facility] Office) 03/22/2016 7:56 AM

## 2016-03-22 NOTE — Progress Notes (Signed)
Nutrition Follow-up  DOCUMENTATION CODES:   Not applicable  INTERVENTION:   Continue current TF regimen as ordered. Will continue to follow and assess. Recommend NPO diet order as pt declining CL trays currently.   NUTRITION DIAGNOSIS:   Inadequate oral intake related to chronic illness as evidenced by NPO status, being addressed with TF.  GOAL:   Patient will meet greater than or equal to 90% of their needs; being met with TF  MONITOR:   TF tolerance, Weight trends, Labs  REASON FOR ASSESSMENT:   Consult Enteral/tube feeding initiation and management  ASSESSMENT:   65 yo female admitted with possible sepsis, possible pneumonia, diarrhea with C.diff pending, hyperglycemia with FSBS >500.  Pt with hx of subarachnoid hemorrhage requiring craniotomy, pt with residual brain injury. Pt has PEG tube presently, hx of trach which was reversed  Palliative care consulted; per note, plan to continue current treatments through the weekend and then reassess a shift towards comfort if needed per HPOA.  Diet Order:  Diet clear liquid Room service appropriate?: Yes; Fluid consistency:: Thin    Current Nutrition: tolerating Glucerna 1.2 at 47m/hr with free water of 2012mTID   Gastrointestinal Profile: Last BM: 03/22/2016, loose stools, c.diff negative   Medications: SS novolog, Lantus, MVI, prednisone, NS at 106mr Labs: Na 145, BUN 29, Mg WDL, Glucose 171    Weight Trend since Admission: Filed Weights   03/20/16 1020 03/21/16 0348 03/22/16 0500  Weight: 136 lb 14.5 oz (62.1 kg) 146 lb 13.2 oz (66.6 kg) 155 lb (70.308 kg)  Noted change in weight. Pt transferred to 1C yesterday. Will continue to follow.   Skin:  Reviewed, no issues   BMI:  Body mass index is 30.27 kg/(m^2).  Estimated Nutritional Needs:   Kcal:  1650-1980 kcals  Protein:  79-99 g  Fluid:  >/= 1.6 L  EDUCATION NEEDS:   No education needs identified at this time  AllDwyane LuoD, LDN Pager  ([phone]/On-Call Pager ([phone]

## 2016-03-22 NOTE — Care Management Important Message (Signed)
Important Message  Patient Details  Name: Stephanie Doyle MRN: [id number] Date of Birth: June 09, 1951   Medicare Important Message Given:  Yes    Gwenette GreetBrenda S Alucard Fearnow, RN 03/22/2016, 1:30 PM

## 2016-03-22 NOTE — Progress Notes (Signed)
Patient ID: Shelton SilvasVicky D Dudding, female   DOB: 28-May-1951, 65 y.o.   MRN: [id number] [employer] [facility] PROGRESS NOTE  Shelton SilvasVicky D Theiss [id number] DOB: 28-May-1951 DOA: 03/20/2016 PCP: [name], MD  HPI/Subjective: Patient answers some yes or no questions. Falls asleep easily. Does not offer any complaints.  Objective: Filed Vitals:   03/22/16 1039 03/22/16 1538  BP: 120/60 115/58  Pulse: 124 112  Temp: 98.2 F (36.8 C) 98.3 F (36.8 C)  Resp: 18 16   No intake or output data in the 24 hours ending 03/22/16 1605 Filed Weights   03/20/16 1020 03/21/16 0348 03/22/16 0500  Weight: 62.1 kg (136 lb 14.5 oz) 66.6 kg (146 lb 13.2 oz) 70.308 kg (155 lb)    ROS: Review of Systems  Constitutional: Negative for fever and chills.  Eyes: Negative for blurred vision.  Respiratory: Negative for cough and shortness of breath.   Cardiovascular: Negative for chest pain.  Gastrointestinal: Negative for nausea, vomiting, diarrhea and constipation.  Genitourinary: Negative for dysuria.  Musculoskeletal: Negative for joint pain.  Neurological: Negative for dizziness and headaches.   Exam: Physical Exam  Constitutional: She appears lethargic.  HENT:  Nose: No mucosal edema.  Mouth/Throat: No oropharyngeal exudate or posterior oropharyngeal edema.  Eyes: Conjunctivae, EOM and lids are normal. Pupils are equal, round, and reactive to light.  Neck: No JVD present. Carotid bruit is not present. No edema present. No thyroid mass and no thyromegaly present.  Cardiovascular: S1 normal and S2 normal.  Tachycardia present.  Exam reveals no gallop.   No murmur heard. Pulses:      Dorsalis pedis pulses are 2+ on the right side, and 2+ on the left side.  Respiratory: Accessory muscle usage present. No respiratory distress. She has decreased breath sounds in the right lower field and the left lower field. She has no wheezes. She has no rhonchi. She has no rales.  GI: Soft. Bowel sounds are normal. There  is no tenderness.  Musculoskeletal:       Right ankle: She exhibits swelling.       Left ankle: She exhibits swelling.  Lymphadenopathy:    She has no cervical adenopathy.  Neurological: She appears lethargic.  Skin: Skin is warm. No rash noted. Nails show no clubbing.  Psychiatric: She has a normal mood and affect.      Data Reviewed: Basic Metabolic Panel:  Recent Labs Lab 03/17/16 0910 03/20/16 0634 03/20/16 1112 03/21/16 0415 03/22/16 0338  NA 127* 143 146* 145 145  K 4.5 4.5 4.7 4.1 4.3  CL 87* 98* 104 111 108  CO2 34* 38* 35* 32 36*  GLUCOSE 344* 558* 398* 135* 171*  BUN 14 31* 28* 28* 29*  CREATININE 0.64 0.94 0.85 0.61 0.67  CALCIUM 8.1* 8.5* 8.0* 7.7* 8.2*  MG  --   --   --   --  2.4   Liver Function Tests:  Recent Labs Lab 03/20/16 0634 03/20/16 1112  AST 30 24  ALT 35 31  ALKPHOS 131* 116  BILITOT <0.1* 0.2*  PROT 5.0* 5.0*  ALBUMIN 2.2* 2.1*    Recent Labs Lab 03/20/16 0634  LIPASE 23   CBC:  Recent Labs Lab 03/17/16 0910 03/20/16 0634 03/20/16 1112 03/21/16 0415 03/22/16 0338  WBC 16.8* 55.3* 56.3* 48.1* 26.7*  NEUTROABS 12.9* 52.5* 52.9*  --   --   HGB 10.6* 11.2* 11.0* 10.7* 11.7*  HCT 31.9* 35.1 34.2* 33.8* 36.2  MCV 84.1 86.3 85.8 86.6 85.7  PLT 241  308 306 277 248   Cardiac Enzymes:  Recent Labs Lab 03/20/16 0634 03/20/16 1112  TROPONINI 0.13* 0.24*    CBG:  Recent Labs Lab 03/21/16 2049 03/22/16 0010 03/22/16 0358 03/22/16 0741 03/22/16 1135  GLUCAP 189* 191* 151* 167* 149*    Recent Results (from the past 240 hour(s))  C difficile quick screen w PCR reflex     Status: Abnormal   Collection Time: 03/13/16  8:20 AM  Result Value Ref Range Status   C Diff antigen POSITIVE (A) NEGATIVE Final   C Diff toxin POSITIVE (A) NEGATIVE Final    Comment: CRITICAL RESULT CALLED TO, READ BACK BY AND VERIFIED WITH: SHANNON LEACH 03/13/16 1435 SGD    C Diff interpretation   Final    Positive for toxigenic C.  difficile, active toxin production present.  Culture, blood (Routine X 2)     Status: None (Preliminary result)   Collection Time: 03/20/16  6:35 AM  Result Value Ref Range Status   Specimen Description BLOOD RIGHT FOREARM  Final   Special Requests   Final    BOTTLES DRAWN AEROBIC AND ANAEROBIC AER ANA   Culture NO GROWTH 2 DAYS  Final   Report Status PENDING  Incomplete  Culture, blood (Routine X 2)     Status: None (Preliminary result)   Collection Time: 03/20/16  6:38 AM  Result Value Ref Range Status   Specimen Description BLOOD LEFT WRIST  Final   Special Requests   Final    BOTTLES DRAWN AEROBIC AND ANAEROBIC AER ANA   Culture NO GROWTH 2 DAYS  Final   Report Status PENDING  Incomplete  Urine culture     Status: None   Collection Time: 03/20/16  8:40 AM  Result Value Ref Range Status   Specimen Description URINE, RANDOM  Final   Special Requests NONE  Final   Culture NO GROWTH Performed at [facility]   Final   Report Status 03/21/2016 FINAL  Final  MRSA PCR Screening     Status: None   Collection Time: 03/20/16 10:55 AM  Result Value Ref Range Status   MRSA by PCR NEGATIVE NEGATIVE Final    Comment:        The GeneXpert MRSA Assay (FDA approved for NASAL specimens only), is one component of a comprehensive MRSA colonization surveillance program. It is not intended to diagnose MRSA infection nor to guide or monitor treatment for MRSA infections.       Scheduled Meds: . antiseptic oral rinse  7 mL Mouth Rinse q12n4p  . chlorhexidine  15 mL Mouth Rinse BID  . clonazePAM  0.5 mg Oral BID  . enoxaparin (LOVENOX) injection  40 mg Subcutaneous Q24H  . free water  200 mL Per Tube Q8H  . insulin aspart  0-15 Units Subcutaneous Q4H  . insulin glargine  7 Units Subcutaneous QHS  . ipratropium-albuterol  3 mL Inhalation QID  . levETIRAcetam  500 mg Oral BID  . levothyroxine  25 mcg Oral QAC breakfast  . metoprolol  50 mg Oral BID  .  multivitamin with minerals  1 tablet Oral Daily  . predniSONE  5 mg Oral Q breakfast  . rosuvastatin  20 mg Oral Q24H  . saccharomyces boulardii  250 mg Oral BID  . sodium chloride flush  3 mL Intravenous Q12H  . vancomycin  500 mg Oral Q6H   Continuous Infusions: . sodium chloride 10 mL/hr at 03/21/16 2156  . feeding  supplement (GLUCERNA 1.2 CAL) 1,000 mL (03/21/16 1500)    Assessment/Plan:  1. Sepsis secondary to C. difficile colitis. Leukocytosis trending in the right direction but still very elevated. I would like to see mental status and white count trending better prior to any disposition. Continue oral vancomycin. 2. Pneumonia ruled out. Zithromax. 3. Type 2 diabetes mellitus. Sugars are trending better. Patient is on low-dose Lantus and sliding scale. 4. Seizure disorder on Keppra 5. Hypothyroidism unspecified on levothyroxine 6. Essential hypertension and tachycardia on metoprolol 7. Hyperlipidemia unspecified on Crestor 8. Hypernatremia on free water via PEG tube feedings 9. Appreciate palliative care consultation. Patient is a DO NOT RESUSCITATE. Family will see how things go through the weekend and decide on whether or not to continue treatment or not. May be a candidate for hospice.  Code Status:     Code Status Orders        Start     Ordered   03/20/16 1043  Do not attempt resuscitation (DNR)   Continuous    Question Answer Comment  In the event of cardiac or respiratory ARREST Do not call a "code blue"   In the event of cardiac or respiratory ARREST Do not perform Intubation, CPR, defibrillation or ACLS   In the event of cardiac or respiratory ARREST Use medication by any route, position, wound care, and other measures to relive pain and suffering. May use oxygen, suction and manual treatment of airway obstruction as needed for comfort.      03/20/16 1042    Code Status History    Date Active Date Inactive Code Status Order ID Comments User Context    03/20/2016 10:29 AM 03/20/2016 10:42 AM Full Code [id number]  Delfino LovettVipul Shah, MD Inpatient   08/26/2014  5:12 PM 09/23/2014  5:41 PM Full Code [id number]  Leane Parapal Moore, CNA Inpatient   07/26/2014  7:55 PM 08/26/2014  5:12 PM Full Code [id number]  Coletta MemosKyle Cabbell, MD Inpatient     Family Communication: Spoke with the family along with the palliative care team Disposition Plan: Watch through the weekend and see how things go  Antibiotics:  Oral vancomycin  Time spent: 30 minutes  Alford HighlandWIETING, Jaida Basurto  [employer]

## 2016-03-22 NOTE — Progress Notes (Signed)
Dr. Anne HahnWillis notified to clarify CBG ac/hs vs. Every 4 hours. Acknowledged, will discontinue AC/HS CBG. Windy Carinaurner,Savina Olshefski K, RN 8:37 PM 03/22/2016

## 2016-03-22 NOTE — Progress Notes (Addendum)
[facility]* [facility]     Assessment and Plan:   65 year old female with C. difficile sepsis.  A: Sepsis due to C. difficile,, complicated by metabolic encephalopathy, leukocytosis, debility, deconditioning, sinus tachycardia. -Above problems appear to have stabilized, though she continues to have severe deconditioning, as well as reduced mental status. -Right middle lobe atelectasis. -Long-term prognosis appears guarded.  P: -Continue current antibiotics. -Continue gentle rehydration. -CBC, will check lactic acid. -Continue palliative care discussions. -Outpatient follow-up of right middle lobe atelectasis with pulmonary.   Date: 03/22/2016  MRN# [id number] Stephanie Doyle [birth year]   Stephanie Doyle is a 65 y.o. old female seen in follow up for chief complaint of  Chief Complaint  Patient presents with  . Shortness of Breath     HPI:   No new complaints today, patient appears fatigued.  Allergies:  Augmentin and Vancomycin  Review of Systems: Gen:  Denies  fever, sweats. HEENT: Denies blurred vision. Cvc:  No dizziness, chest pain or heaviness Resp:   Denies cough or sputum porduction. Gi: Denies swallowing difficulty, stomach pain. constipation, bowel incontinence Gu:  Denies bladder incontinence, burning urine Ext:   No Joint pain, stiffness. Skin: No skin rash, easy bruising. Endoc:  No polyuria, polydipsia. Psych: No depression, insomnia. Other:  All other systems were reviewed and found to be negative other than what is mentioned in the HPI.   Physical Examination:   VS: BP 115/58 mmHg  Pulse 112  Temp(Src) 98.3 F (36.8 C) (Oral)  Resp 16  Ht 5' (1.524 m)  Wt 155 lb (70.308 kg)  BMI 30.27 kg/m2  SpO2 96%  General Appearance: No distress , appears tired Neuro:without focal findings,  speech slow HEENT: PERRLA, EOM intact. Pulmonary: normal breath sounds, decreased air entry in both lower lobes. CardiovascularNormal S1,S2.  No m/r/g.     Abdomen: Benign, Soft, non-tender. Renal:  No costovertebral tenderness  GU:  Not performed at this time. Endoc: No evident thyromegaly, no signs of acromegaly. Skin:   warm, no rash. Extremities: normal, no cyanosis, clubbing.   LABORATORY PANEL:   CBC  Recent Labs Lab 03/22/16 0338  WBC 26.7*  HGB 11.7*  HCT 36.2  PLT 248   ------------------------------------------------------------------------------------------------------------------  Chemistries   Recent Labs Lab 03/20/16 1112  03/22/16 0338  NA 146*  < > 145  K 4.7  < > 4.3  CL 104  < > 108  CO2 35*  < > 36*  GLUCOSE 398*  < > 171*  BUN 28*  < > 29*  CREATININE 0.85  < > 0.67  CALCIUM 8.0*  < > 8.2*  MG  --   --  2.4  AST 24  --   --   ALT 31  --   --   ALKPHOS 116  --   --   BILITOT 0.2*  --   --   < > = values in this interval not displayed. ------------------------------------------------------------------------------------------------------------------  Cardiac Enzymes  Recent Labs Lab 03/20/16 1112  TROPONINI 0.24*   ------------------------------------------------------------  RADIOLOGY:   No results found for this or any previous visit. Results for orders placed in visit on 12/30/11  DG Chest 2 View   Narrative * PRIOR REPORT IMPORTED FROM AN EXTERNAL SYSTEM *   PRIOR REPORT IMPORTED FROM THE SYNGO WORKFLOW SYSTEM   REASON FOR EXAM:    SHORTNESS OF BREATH  COMMENTS:   PROCEDURE:     DXR - DXR CHEST PA (OR AP) AND LATERAL  -  Dec 30 2011   9:42AM   RESULT:     Comparison: None.   Findings:  The heart and mediastinum are within normal limits. The lungs are  hyperinflated. Subcentimeter nodular density overlying the right first  costochondral junction is felt to be secondary to the overlying osseous  structures. Otherwise, no focal pulmonary opacities.   IMPRESSION:  1. Findings of COPD. Otherwise, no acute cardiopulmonary disease.  2. Subcentimeter nodular density overlying  the right upper lung is felt to  be artifactual related to the overlying first costochondral junction.  However, followup radiographs are suggested to ensure stability.       ------------------------------------------------------------------------------------------------------------------  Thank  [facility] to assist in the care of your patient. Our recommendations are noted above.  Please contact us if we can be of further service.   Wells Guileseep Marra Fraga, MD.  [facility] Office Number: [phone]  Santiago Gladavid Kasa, M.D.  Stephanie AcreVishal Mungal, M.D.  Billy Fischeravid Simonds, M.D  03/22/2016

## 2016-03-23 LAB — GLUCOSE, CAPILLARY
GLUCOSE-CAPILLARY: 131 mg/dL — AB (ref 65–99)
GLUCOSE-CAPILLARY: 135 mg/dL — AB (ref 65–99)
GLUCOSE-CAPILLARY: 155 mg/dL — AB (ref 65–99)
GLUCOSE-CAPILLARY: 184 mg/dL — AB (ref 65–99)
Glucose-Capillary: 140 mg/dL — ABNORMAL HIGH (ref 65–99)
Glucose-Capillary: 177 mg/dL — ABNORMAL HIGH (ref 65–99)

## 2016-03-23 MED ORDER — FUROSEMIDE 10 MG/ML IJ SOLN
40.0000 mg | Freq: Once | INTRAMUSCULAR | Status: AC
  Administered 2016-03-23: 14:00:00 40 mg via INTRAVENOUS
  Filled 2016-03-23: qty 4

## 2016-03-23 MED ORDER — BUDESONIDE 0.25 MG/2ML IN SUSP
0.2500 mg | Freq: Two times a day (BID) | RESPIRATORY_TRACT | Status: DC
  Administered 2016-03-23 – 2016-03-29 (×12): 0.25 mg via RESPIRATORY_TRACT
  Filled 2016-03-23 (×12): qty 2

## 2016-03-23 NOTE — Progress Notes (Signed)
Continues to be tachypnic with non-productive, congested cough, SVN given X1 overnight; unable to perform flutter valve; HOB elevation maintained;  Windy Carinaurner,Tamon Parkerson K, RN 6:08 AM 03/23/2016

## 2016-03-23 NOTE — Progress Notes (Signed)
DR Renae GlossWIETING is aware of pt's high respiratory rate , will continue to monitor

## 2016-03-23 NOTE — Clinical Social Work Note (Signed)
[facility]. CSW will update facility with services that pt will be returning to facility with, such as possibly Hospice or Palliative Care. CSW will continue to follow.   Dede QuerySarah Coulter Oldaker, MSW, LCSW  Clinical Social Worker  [phone]

## 2016-03-23 NOTE — Progress Notes (Signed)
Patient ID: Stephanie Doyle [place], female   DOB: 1950/11/27, 65 y.o.   MRN: [id number] [employer] PROGRESS NOTE  Stephanie Doyle Hyde [id number]:[id number] DOB: 1950/11/27 DOA: 03/20/2016 PCP: [name], MD  HPI/Subjective: Patient awakened from sleep. Offers no complaints. As per the nurse, she is not eating.  Objective: Filed Vitals:   03/22/16 2105 03/23/16 1237  BP: 134/69 136/60  Pulse: 120 112  Temp: 98.6 F (37 C) 100 F (37.8 C)  Resp: 18 24    Filed Weights   03/22/16 0500 03/22/16 2105 03/23/16 0411  Weight: 70.308 kg (155 lb) 66.951 kg (147 lb 9.6 oz) 68.04 kg (150 lb)    ROS: Review of Systems  Unable to perform ROS Limited secondary to lethargy Exam: Physical Exam  Constitutional: She appears lethargic.  HENT:  Nose: No mucosal edema.  Mouth/Throat: No oropharyngeal exudate or posterior oropharyngeal edema.  Eyes: Conjunctivae, EOM and lids are normal. Pupils are equal, round, and reactive to light.  Neck: No JVD present. Carotid bruit is not present. No edema present. No thyroid mass and no thyromegaly present.  Cardiovascular: S1 normal and S2 normal.  Tachycardia present.  Exam reveals no gallop.   No murmur heard. Pulses:      Dorsalis pedis pulses are 2+ on the right side, and 2+ on the left side.  Respiratory: Accessory muscle usage present. No respiratory distress. She has no decreased breath sounds. She has no wheezes. She has rhonchi in the right middle field, the right lower field and the left lower field. She has no rales.  GI: Soft. Bowel sounds are normal. There is no tenderness.  Musculoskeletal:       Right ankle: She exhibits swelling.       Left ankle: She exhibits swelling.  Lymphadenopathy:    She has no cervical adenopathy.  Neurological: She appears lethargic.  Skin: Skin is warm. No rash noted. Nails show no clubbing.  Psychiatric: She has a normal mood and affect.      Data Reviewed: Basic Metabolic Panel:  Recent Labs Lab  03/17/16 0910 03/20/16 0634 03/20/16 1112 03/21/16 0415 03/22/16 0338  NA 127* 143 146* 145 145  K 4.5 4.5 4.7 4.1 4.3  CL 87* 98* 104 111 108  CO2 34* 38* 35* 32 36*  GLUCOSE 344* 558* 398* 135* 171*  BUN 14 31* 28* 28* 29*  CREATININE 0.64 0.94 0.85 0.61 0.67  CALCIUM 8.1* 8.5* 8.0* 7.7* 8.2*  MG  --   --   --   --  2.4   Liver Function Tests:  Recent Labs Lab 03/20/16 0634 03/20/16 1112  AST 30 24  ALT 35 31  ALKPHOS 131* 116  BILITOT <0.1* 0.2*  PROT 5.0* 5.0*  ALBUMIN 2.2* 2.1*    Recent Labs Lab 03/20/16 0634  LIPASE 23   CBC:  Recent Labs Lab 03/17/16 0910 03/20/16 0634 03/20/16 1112 03/21/16 0415 03/22/16 0338  WBC 16.8* 55.3* 56.3* 48.1* 26.7*  NEUTROABS 12.9* 52.5* 52.9*  --   --   HGB 10.6* 11.2* 11.0* 10.7* 11.7*  HCT 31.9* 35.1 34.2* 33.8* 36.2  MCV 84.1 86.3 85.8 86.6 85.7  PLT 241 308 306 277 248   Cardiac Enzymes:  Recent Labs Lab 03/20/16 0634 03/20/16 1112  TROPONINI 0.13* 0.24*    CBG:  Recent Labs Lab 03/22/16 2100 03/23/16 0049 03/23/16 0402 03/23/16 0824 03/23/16 1156  GLUCAP 170* 155* 131* 135* 140*    Recent Results (from the past 240 hour(s))  Culture, blood (  Routine X 2)     Status: None (Preliminary result)   Collection Time: 03/20/16  6:35 AM  Result Value Ref Range Status   Specimen Description BLOOD RIGHT FOREARM  Final   Special Requests   Final    BOTTLES DRAWN AEROBIC AND ANAEROBIC AER 8ML ANA 2ML   Culture NO GROWTH 3 DAYS  Final   Report Status PENDING  Incomplete  Culture, blood (Routine X 2)     Status: None (Preliminary result)   Collection Time: 03/20/16  6:38 AM  Result Value Ref Range Status   Specimen Description BLOOD LEFT WRIST  Final   Special Requests   Final    BOTTLES DRAWN AEROBIC AND ANAEROBIC AER 6ML ANA 2ML   Culture NO GROWTH 3 DAYS  Final   Report Status PENDING  Incomplete  Urine culture     Status: None   Collection Time: 03/20/16  8:40 AM  Result Value Ref Range Status    Specimen Description URINE, RANDOM  Final   Special Requests NONE  Final   Culture NO GROWTH [facility]   Final   Report Status 03/21/2016 FINAL  Final  MRSA PCR Screening     Status: None   Collection Time: 03/20/16 10:55 AM  Result Value Ref Range Status   MRSA by PCR NEGATIVE NEGATIVE Final    Comment:        The GeneXpert MRSA Assay (FDA approved for NASAL specimens only), is one component of a comprehensive MRSA colonization surveillance program. It is not intended to diagnose MRSA infection nor to guide or monitor treatment for MRSA infections.       Scheduled Meds: . antiseptic oral rinse  7 mL Mouth Rinse q12n4p  . budesonide (PULMICORT) nebulizer solution  0.25 mg Nebulization BID  . chlorhexidine  15 mL Mouth Rinse BID  . clonazePAM  0.5 mg Oral BID  . enoxaparin (LOVENOX) injection  40 mg Subcutaneous Q24H  . free water  200 mL Per Tube Q8H  . furosemide  40 mg Intravenous Once  . insulin aspart  0-15 Units Subcutaneous Q4H  . insulin glargine  7 Units Subcutaneous QHS  . ipratropium-albuterol  3 mL Inhalation QID  . levETIRAcetam  500 mg Oral BID  . levothyroxine  25 mcg Oral QAC breakfast  . metoprolol  50 mg Oral BID  . multivitamin with minerals  1 tablet Oral Daily  . predniSONE  5 mg Oral Q breakfast  . rosuvastatin  20 mg Oral Q24H  . saccharomyces boulardii  250 mg Oral BID  . sodium chloride flush  3 mL Intravenous Q12H  . vancomycin  500 mg Oral Q6H   Continuous Infusions: . feeding supplement (GLUCERNA 1.2 CAL) 1,000 mL (03/21/16 1500)    Assessment/Plan:  1. Sepsis secondary to C. difficile colitis With lethargy. Leukocytosis trending in the right direction but still very elevated. I would like to see mental status and white count trending better prior to any disposition. Continue oral vancomycin.Family to consider hospice if she doesn't improve 2. Acute on chronic respiratory failure. Likely COPD exacerbation.  Continue oxygen. Add budesonide nebulizers. Hold off on antibiotics. 3. Type 2 diabetes mellitus. Sugars are trending better. Patient is on low-dose Lantus and sliding scale. 4. Seizure disorder on Keppra 5. Hypothyroidism unspecified on levothyroxine 6. Essential hypertension and tachycardia on metoprolol 7. Hyperlipidemia unspecified on Crestor 8. Hypernatremia on free water via PEG tube feedings 9. Appreciate palliative care consultation. Patient is a DO NOT  RESUSCITATE. Family will see how things go through the weekend and decide on whether or not to continue treatment or not. May be a candidate for hospice.  Code Status:     Code Status Orders        Start     Ordered   03/20/16 1043  Do not attempt resuscitation (DNR)   Continuous    Question Answer Comment  In the event of cardiac or respiratory ARREST Do not call a "code blue"   In the event of cardiac or respiratory ARREST Do not perform Intubation, CPR, defibrillation or ACLS   In the event of cardiac or respiratory ARREST Use medication by any route, position, wound care, and other measures to relive pain and suffering. May use oxygen, suction and manual treatment of airway obstruction as needed for comfort.      03/20/16 1042    Code Status History    Date Active Date Inactive Code Status Order ID Comments User Context   03/20/2016 10:29 AM 03/20/2016 10:42 AM Full Code [id number]  Delfino Lovett, MD Inpatient   08/26/2014  5:12 PM 09/23/2014  5:41 PM Full Code [id number]  Leane Para, CNA Inpatient   07/26/2014  7:55 PM 08/26/2014  5:12 PM Full Code [id number]  Coletta Memos, MD Inpatient     Family Communication: Spoke with the family yesterday Disposition Plan: Watch through the weekend and see how things go  Antibiotics:  Oral vancomycin  Time spent: 25 minutes  Alford Highland  [employer]

## 2016-03-24 ENCOUNTER — Encounter
Admission: RE | Admit: 2016-03-24 | Discharge: 2016-03-24 | Disposition: A | Discharge status: Home / Self Care | Source: Ambulatory Visit | Attending: Internal Medicine | Admitting: Internal Medicine

## 2016-03-24 ENCOUNTER — Inpatient Hospital Stay: Admit: 2016-03-24 | Payer: Self-pay

## 2016-03-24 ENCOUNTER — Inpatient Hospital Stay: Payer: Medicare Other

## 2016-03-24 DIAGNOSIS — A047 Enterocolitis due to Clostridium difficile: Secondary | ICD-10-CM | POA: Insufficient documentation

## 2016-03-24 LAB — CBC
HCT: 33.7 % — ABNORMAL LOW (ref 35.0–47.0)
Hemoglobin: 10.9 g/dL — ABNORMAL LOW (ref 12.0–16.0)
MCH: 27.3 pg (ref 26.0–34.0)
MCHC: 32.5 g/dL (ref 32.0–36.0)
MCV: 84.2 fL (ref 80.0–100.0)
PLATELETS: 188 10*3/uL (ref 150–440)
RBC: 4 MIL/uL (ref 3.80–5.20)
RDW: 15 % — AB (ref 11.5–14.5)
WBC: 23.2 10*3/uL — AB (ref 3.6–11.0)

## 2016-03-24 LAB — GLUCOSE, CAPILLARY
GLUCOSE-CAPILLARY: 161 mg/dL — AB (ref 65–99)
GLUCOSE-CAPILLARY: 153 mg/dL — AB (ref 65–99)
GLUCOSE-CAPILLARY: 179 mg/dL — AB (ref 65–99)
GLUCOSE-CAPILLARY: 182 mg/dL — AB (ref 65–99)
Glucose-Capillary: 154 mg/dL — ABNORMAL HIGH (ref 65–99)
Glucose-Capillary: 156 mg/dL — ABNORMAL HIGH (ref 65–99)

## 2016-03-24 LAB — BASIC METABOLIC PANEL
Anion gap: 3 — ABNORMAL LOW (ref 5–15)
BUN: 17 mg/dL (ref 6–20)
CALCIUM: 8.1 mg/dL — AB (ref 8.9–10.3)
CO2: 43 mmol/L — AB (ref 22–32)
CREATININE: 0.59 mg/dL (ref 0.44–1.00)
Chloride: 91 mmol/L — ABNORMAL LOW (ref 101–111)
GFR calc non Af Amer: >60 mL/min (ref >60)
Glucose, Bld: 165 mg/dL — ABNORMAL HIGH (ref 65–99)
Potassium: 4.2 mmol/L (ref 3.5–5.1)
SODIUM: 137 mmol/L (ref 135–145)

## 2016-03-24 MED ORDER — PREDNISONE 5 MG PO TABS
5.0000 mg | ORAL_TABLET | Freq: Every day | ORAL | Status: DC
  Administered 2016-03-25 – 2016-03-29 (×5): 5 mg
  Filled 2016-03-24 (×5): qty 1

## 2016-03-24 MED ORDER — METHYLPREDNISOLONE SODIUM SUCC 40 MG IJ SOLR: 40.0000 mg | INTRAMUSCULAR | Status: DC

## 2016-03-24 MED ORDER — IPRATROPIUM-ALBUTEROL 0.5-2.5 (3) MG/3ML IN SOLN
3.0000 mL | Freq: Three times a day (TID) | RESPIRATORY_TRACT | Status: DC
  Administered 2016-03-24 – 2016-03-29 (×14): 3 mL via RESPIRATORY_TRACT
  Filled 2016-03-24 (×15): qty 3

## 2016-03-24 NOTE — Progress Notes (Signed)
Patient ID: Stephanie Doyle, female   DOB: June 22, 1951, 65 y.o.   MRN: [id number] [employer] PROGRESS NOTE  Stephanie Doyle [id number] DOB: June 22, 1951 DOA: 03/20/2016 PCP: Stephanie CrutchPradeep Ramachandran, MD  HPI/Subjective: Patient more alert today. Answers all questions appropriately. Answers no to all questions.  Objective: Filed Vitals:   03/24/16 0931 03/24/16 1300  BP: 132/54 118/66  Pulse: 131 100  Temp:  98 F (36.7 C)  Resp:  20    Filed Weights   03/22/16 2105 03/23/16 0411 03/24/16 0500  Weight: 66.951 kg (147 lb 9.6 oz) 68.04 kg (150 lb) 68.856 kg (151 lb 12.8 oz)    ROS: Review of Systems  Constitutional: Negative for fever and chills.  Eyes: Negative for blurred vision.  Respiratory: Negative for cough and shortness of breath.   Cardiovascular: Negative for chest pain.  Gastrointestinal: Negative for nausea, vomiting, abdominal pain, diarrhea and constipation.  Genitourinary: Negative for dysuria.  Musculoskeletal: Negative for joint pain.  Neurological: Negative for dizziness and headaches.   Exam: Physical Exam  HENT:  Nose: No mucosal edema.  Mouth/Throat: No oropharyngeal exudate or posterior oropharyngeal edema.  Eyes: Conjunctivae, EOM and lids are normal. Pupils are equal, round, and reactive to light.  Neck: No JVD present. Carotid bruit is not present. No edema present. No thyroid mass and no thyromegaly present.  Cardiovascular: S1 normal and S2 normal.  Tachycardia present.  Exam reveals no gallop.   No murmur heard. Pulses:      Dorsalis pedis pulses are 2+ on the right side, and 2+ on the left side.  Respiratory: Accessory muscle usage present. No respiratory distress. She has no decreased breath sounds. She has no wheezes. She has rhonchi in the right middle field, the right lower field and the left lower field. She has no rales.  GI: Soft. Bowel sounds are normal. There is no tenderness.  Musculoskeletal:       Right ankle: She exhibits swelling.        Left ankle: She exhibits swelling.  Lymphadenopathy:    She has no cervical adenopathy.  Neurological: She is alert.  Skin: Skin is warm. Nails show no clubbing.  This morning her entire body was red. This afternoon when I reevaluated her it had faded.  Psychiatric: She has a normal mood and affect.      Data Reviewed: Basic Metabolic Panel:  Recent Labs Lab 03/20/16 0634 03/20/16 1112 03/21/16 0415 03/22/16 0338 03/24/16 0456  NA 143 146* 145 145 137  K 4.5 4.7 4.1 4.3 4.2  CL 98* 104 111 108 91*  CO2 38* 35* 32 36* 43*  GLUCOSE 558* 398* 135* 171* 165*  BUN 31* 28* 28* 29* 17  CREATININE 0.94 0.85 0.61 0.67 0.59  CALCIUM 8.5* 8.0* 7.7* 8.2* 8.1*  MG  --   --   --  2.4  --    Liver Function Tests:  Recent Labs Lab 03/20/16 0634 03/20/16 1112  AST 30 24  ALT 35 31  ALKPHOS 131* 116  BILITOT <0.1* 0.2*  PROT 5.0* 5.0*  ALBUMIN 2.2* 2.1*    Recent Labs Lab 03/20/16 0634  LIPASE 23   CBC:  Recent Labs Lab 03/20/16 0634 03/20/16 1112 03/21/16 0415 03/22/16 0338 03/24/16 0456  WBC 55.3* 56.3* 48.1* 26.7* 23.2*  NEUTROABS 52.5* 52.9*  --   --   --   HGB 11.2* 11.0* 10.7* 11.7* 10.9*  HCT 35.1 34.2* 33.8* 36.2 33.7*  MCV 86.3 85.8 86.6 85.7 84.2  PLT  308 306 277 248 188   Cardiac Enzymes:  Recent Labs Lab 03/20/16 0634 03/20/16 1112  TROPONINI 0.13* 0.24*    CBG:  Recent Labs Lab 03/23/16 2048 03/24/16 0006 03/24/16 0437 03/24/16 0750 03/24/16 1113  GLUCAP 184* 156* 154* 161* 153*    Recent Results (from the past 240 hour(s))  Culture, blood (Routine X 2)     Status: None (Preliminary result)   Collection Time: 03/20/16  6:35 AM  Result Value Ref Range Status   Specimen Description BLOOD RIGHT FOREARM  Final   Special Requests   Final    BOTTLES DRAWN AEROBIC AND ANAEROBIC AER ANA   Culture NO GROWTH 4 DAYS  Final   Report Status PENDING  Incomplete  Culture, blood (Routine X 2)     Status: None (Preliminary result)    Collection Time: 03/20/16  6:38 AM  Result Value Ref Range Status   Specimen Description BLOOD LEFT WRIST  Final   Special Requests   Final    BOTTLES DRAWN AEROBIC AND ANAEROBIC AER ANA   Culture NO GROWTH 4 DAYS  Final   Report Status PENDING  Incomplete  Urine culture     Status: None   Collection Time: 03/20/16  8:40 AM  Result Value Ref Range Status   Specimen Description URINE, RANDOM  Final   Special Requests NONE  Final   Culture NO GROWTH Performed at [facility]   Final   Report Status 03/21/2016 FINAL  Final  MRSA PCR Screening     Status: None   Collection Time: 03/20/16 10:55 AM  Result Value Ref Range Status   MRSA by PCR NEGATIVE NEGATIVE Final    Comment:        The GeneXpert MRSA Assay (FDA approved for NASAL specimens only), is one component of a comprehensive MRSA colonization surveillance program. It is not intended to diagnose MRSA infection nor to guide or monitor treatment for MRSA infections.       Scheduled Meds: . antiseptic oral rinse  7 mL Mouth Rinse q12n4p  . budesonide (PULMICORT) nebulizer solution  0.25 mg Nebulization BID  . chlorhexidine  15 mL Mouth Rinse BID  . clonazePAM  0.5 mg Oral BID  . enoxaparin (LOVENOX) injection  40 mg Subcutaneous Q24H  . free water  200 mL Per Tube Q8H  . insulin aspart  0-15 Units Subcutaneous Q4H  . insulin glargine  7 Units Subcutaneous QHS  . ipratropium-albuterol  3 mL Inhalation TID  . levETIRAcetam  500 mg Oral BID  . levothyroxine  25 mcg Oral QAC breakfast  . metoprolol  50 mg Oral BID  . multivitamin with minerals  1 tablet Oral Daily  . [START ON 03/25/2016] predniSONE  5 mg Per Tube Q breakfast  . rosuvastatin  20 mg Oral Q24H  . saccharomyces boulardii  250 mg Oral BID  . sodium chloride flush  3 mL Intravenous Q12H  . vancomycin  500 mg Oral Q6H   Continuous Infusions: . feeding supplement (GLUCERNA 1.2 CAL) 1,000 mL (03/24/16 0112)     Assessment/Plan:  1. Sepsis secondary to C. difficile colitis With lethargy. Mental status improved today. Leukocytosis trending in the right direction but still very elevated. I would like to see white count come down a little bit more Continue oral vancomycin. Patient with slow improvement. 2. Acute on chronic respiratory failure. Likely COPD exacerbation. Continue oxygen. Added budesonide nebulizers.  3. Type 2 diabetes mellitus. Sugars are trending  better. Patient is on low-dose Lantus and sliding scale. 4. Seizure disorder on Keppra 5. Hypothyroidism unspecified on levothyroxine 6. Essential hypertension and tachycardia on metoprolol 7. Hyperlipidemia unspecified on Crestor 8. Hypernatremia on free water via PEG tube feedings 9. Appreciate palliative care consultation. Patient is a DO NOT RESUSCITATE.  10. Redness this morning on entire body. This afternoon it seems to have faded. Could be with the oral vancomycin.  Code Status:     Code Status Orders        Start     Ordered   03/20/16 1043  Do not attempt resuscitation (DNR)   Continuous    Question Answer Comment  In the event of cardiac or respiratory ARREST Do not call a "code blue"   In the event of cardiac or respiratory ARREST Do not perform Intubation, CPR, defibrillation or ACLS   In the event of cardiac or respiratory ARREST Use medication by any route, position, wound care, and other measures to relive pain and suffering. May use oxygen, suction and manual treatment of airway obstruction as needed for comfort.      03/20/16 1042    Code Status History    Date Active Date Inactive Code Status Order ID Comments User Context   03/20/2016 10:29 AM 03/20/2016 10:42 AM Full Code [id number]  Delfino LovettVipul Shah, MD Inpatient   08/26/2014  5:12 PM 09/23/2014  5:41 PM Full Code [id number]  Leane Parapal Moore, CNA Inpatient   07/26/2014  7:55 PM 08/26/2014  5:12 PM Full Code [id number]  Coletta MemosKyle Cabbell, MD Inpatient     Family Communication:  Spoke with the family At bedside this afternoon Disposition Plan: Potentially back to the facility on Monday  Antibiotics:  Oral vancomycin  Time spent: 25 minutes  Alford HighlandWIETING, Suleyman Ehrman  [employer]

## 2016-03-24 NOTE — Progress Notes (Signed)
1850: Patient spent day in room. VS  - heart rate varies from 90's to 130's. On Metopolol. Patient not eating orally - taking medications via feeding tube. Tube feeding infusing without problems, patient tolerating it well. No cough. On room air - no respiratory distress noted. Patient uncomplaining.

## 2016-03-25 LAB — CBC
HCT: 30.8 % — ABNORMAL LOW (ref 35.0–47.0)
HEMOGLOBIN: 10.1 g/dL — AB (ref 12.0–16.0)
MCH: 27.2 pg (ref 26.0–34.0)
MCHC: 32.7 g/dL (ref 32.0–36.0)
MCV: 83.2 fL (ref 80.0–100.0)
Platelets: 190 10*3/uL (ref 150–440)
RBC: 3.7 MIL/uL — ABNORMAL LOW (ref 3.80–5.20)
RDW: 14.5 % (ref 11.5–14.5)
WBC: 36.1 10*3/uL — ABNORMAL HIGH (ref 3.6–11.0)

## 2016-03-25 LAB — CULTURE, BLOOD (ROUTINE X 2)
CULTURE: NO GROWTH
Culture: NO GROWTH

## 2016-03-25 LAB — GLUCOSE, CAPILLARY
GLUCOSE-CAPILLARY: 145 mg/dL — AB (ref 65–99)
GLUCOSE-CAPILLARY: 146 mg/dL — AB (ref 65–99)
GLUCOSE-CAPILLARY: 161 mg/dL — AB (ref 65–99)
GLUCOSE-CAPILLARY: 200 mg/dL — AB (ref 65–99)
Glucose-Capillary: 140 mg/dL — ABNORMAL HIGH (ref 65–99)
Glucose-Capillary: 200 mg/dL — ABNORMAL HIGH (ref 65–99)
Glucose-Capillary: 186 mg/dL — ABNORMAL HIGH (ref 65–99)

## 2016-03-25 MED ORDER — LABETALOL HCL 5 MG/ML IV SOLN
10.0000 mg | INTRAVENOUS | Status: DC | PRN
  Administered 2016-03-25: 03:00:00 10 mg via INTRAVENOUS
  Filled 2016-03-25 (×2): qty 4

## 2016-03-25 NOTE — Progress Notes (Signed)
Patient ID: Stephanie SilvasVicky D Doyle, female   DOB: April 18, 1951, 65 y.o.   MRN: [id number]  [facility] PROGRESS NOTE  Stephanie Doyle [id number] DOB: April 18, 1951 DOA: 03/20/2016 PCP: [name], MD  HPI/Subjective: Patient had a lot of diarrhea overnight. Nursing staff asking about her rectal tube. Patient with no complaints of nausea vomiting or abdominal pain.  Objective: Filed Vitals:   03/24/16 2054 03/25/16 0446  BP: 114/62 100/57  Pulse: 134 125  Temp: 99.1 F (37.3 C) 100.3 F (37.9 C)  Resp: 18 16    Filed Weights   03/23/16 0411 03/24/16 0500 03/25/16 0446  Weight: 68.04 kg (150 lb) 68.856 kg (151 lb 12.8 oz) 69.083 kg (152 lb 4.8 oz)    ROS: Review of Systems  Constitutional: Negative for fever and chills.  Eyes: Negative for blurred vision.  Respiratory: Negative for cough and shortness of breath.   Cardiovascular: Negative for chest pain.  Gastrointestinal: Positive for diarrhea. Negative for nausea, vomiting, abdominal pain and constipation.  Genitourinary: Negative for dysuria.  Musculoskeletal: Negative for joint pain.  Neurological: Negative for dizziness and headaches.   Exam: Physical Exam  HENT:  Nose: No mucosal edema.  Mouth/Throat: No oropharyngeal exudate or posterior oropharyngeal edema.  Eyes: Conjunctivae, EOM and lids are normal. Pupils are equal, round, and reactive to light.  Neck: No JVD present. Carotid bruit is not present. No edema present. No thyroid mass and no thyromegaly present.  Cardiovascular: S1 normal and S2 normal.  Tachycardia present.  Exam reveals no gallop.   No murmur heard. Pulses:      Dorsalis pedis pulses are 2+ on the right side, and 2+ on the left side.  Respiratory: No accessory muscle usage. No respiratory distress. She has no decreased breath sounds. She has no wheezes. She has rhonchi in the right lower field and the left lower field. She has no rales.  GI: Soft. Bowel sounds are normal. There is no  tenderness.  Musculoskeletal:       Right ankle: She exhibits swelling.       Left ankle: She exhibits swelling.  Lymphadenopathy:    She has no cervical adenopathy.  Neurological: She is alert.  Skin: Skin is warm. Nails show no clubbing.  This morning her entire body was red. This afternoon when I reevaluated her it had faded.  Psychiatric: She has a normal mood and affect.      Data Reviewed: Basic Metabolic Panel:  Recent Labs Lab 03/20/16 0634 03/20/16 1112 03/21/16 0415 03/22/16 0338 03/24/16 0456  NA 143 146* 145 145 137  K 4.5 4.7 4.1 4.3 4.2  CL 98* 104 111 108 91*  CO2 38* 35* 32 36* 43*  GLUCOSE 558* 398* 135* 171* 165*  BUN 31* 28* 28* 29* 17  CREATININE 0.94 0.85 0.61 0.67 0.59  CALCIUM 8.5* 8.0* 7.7* 8.2* 8.1*  MG  --   --   --  2.4  --    Liver Function Tests:  Recent Labs Lab 03/20/16 0634 03/20/16 1112  AST 30 24  ALT 35 31  ALKPHOS 131* 116  BILITOT <0.1* 0.2*  PROT 5.0* 5.0*  ALBUMIN 2.2* 2.1*    Recent Labs Lab 03/20/16 0634  LIPASE 23   CBC:  Recent Labs Lab 03/20/16 0634 03/20/16 1112 03/21/16 0415 03/22/16 0338 03/24/16 0456 03/25/16 0417  WBC 55.3* 56.3* 48.1* 26.7* 23.2* 36.1*  NEUTROABS 52.5* 52.9*  --   --   --   --   HGB 11.2* 11.0*  10.7* 11.7* 10.9* 10.1*  HCT 35.1 34.2* 33.8* 36.2 33.7* 30.8*  MCV 86.3 85.8 86.6 85.7 84.2 83.2  PLT 308 306 277 248 188 190   Cardiac Enzymes:  Recent Labs Lab 03/20/16 0634 03/20/16 1112  TROPONINI 0.13* 0.24*    CBG:  Recent Labs Lab 03/24/16 1648 03/24/16 2053 03/25/16 0014 03/25/16 0308 03/25/16 0741  GLUCAP 179* 182* 145* 140* 200*    Recent Results (from the past 240 hour(s))  Culture, blood (Routine X 2)     Status: None   Collection Time: 03/20/16  6:35 AM  Result Value Ref Range Status   Specimen Description BLOOD RIGHT FOREARM  Final   Special Requests   Final    BOTTLES DRAWN AEROBIC AND ANAEROBIC AER ANA   Culture NO GROWTH 5 DAYS  Final    Report Status 03/25/2016 FINAL  Final  Culture, blood (Routine X 2)     Status: None   Collection Time: 03/20/16  6:38 AM  Result Value Ref Range Status   Specimen Description BLOOD LEFT WRIST  Final   Special Requests   Final    BOTTLES DRAWN AEROBIC AND ANAEROBIC AER ANA   Culture NO GROWTH 5 DAYS  Final   Report Status 03/25/2016 FINAL  Final  Urine culture     Status: None   Collection Time: 03/20/16  8:40 AM  Result Value Ref Range Status   Specimen Description URINE, RANDOM  Final   Special Requests NONE  Final   Culture NO GROWTH Performed at [facility]   Final   Report Status 03/21/2016 FINAL  Final  MRSA PCR Screening     Status: None   Collection Time: 03/20/16 10:55 AM  Result Value Ref Range Status   MRSA by PCR NEGATIVE NEGATIVE Final    Comment:        The GeneXpert MRSA Assay (FDA approved for NASAL specimens only), is one component of a comprehensive MRSA colonization surveillance program. It is not intended to diagnose MRSA infection nor to guide or monitor treatment for MRSA infections.       Scheduled Meds: . antiseptic oral rinse  7 mL Mouth Rinse q12n4p  . budesonide (PULMICORT) nebulizer solution  0.25 mg Nebulization BID  . chlorhexidine  15 mL Mouth Rinse BID  . clonazePAM  0.5 mg Oral BID  . enoxaparin (LOVENOX) injection  40 mg Subcutaneous Q24H  . free water  200 mL Per Tube Q8H  . insulin aspart  0-15 Units Subcutaneous Q4H  . insulin glargine  7 Units Subcutaneous QHS  . ipratropium-albuterol  3 mL Inhalation TID  . levETIRAcetam  500 mg Oral BID  . levothyroxine  25 mcg Oral QAC breakfast  . metoprolol  50 mg Oral BID  . multivitamin with minerals  1 tablet Oral Daily  . predniSONE  5 mg Per Tube Q breakfast  . rosuvastatin  20 mg Oral Q24H  . saccharomyces boulardii  250 mg Oral BID  . sodium chloride flush  3 mL Intravenous Q12H  . vancomycin  500 mg Oral Q6H   Continuous Infusions: . feeding supplement  (GLUCERNA 1.2 CAL) 1,000 mL (03/25/16 0054)    Assessment/Plan:  1. Sepsis secondary to C. difficile colitis. Mental status improved today. Leukocytosis Up this morning. Patient having a lot of diarrhea today. May end up needing a rectal tube. I would like to see white count come down a little bit more and diarrhea to subside prior to  disposition. Continue oral vancomycin. 2. Acute on chronic respiratory failure. Likely COPD exacerbation. Continue oxygen. Added budesonide nebulizers.  3. Type 2 diabetes mellitus. Sugars are trending better. Patient is on low-dose Lantus and sliding scale. 4. Seizure disorder on Keppra 5. Hypothyroidism unspecified on levothyroxine 6. Essential hypertension and tachycardia on metoprolol 7. Hyperlipidemia unspecified on Crestor 8. Hypernatremia on free water via PEG tube feedings 9. Appreciate palliative care consultation. Patient is a DO NOT RESUSCITATE.   Code Status:     Code Status Orders        Start     Ordered   03/20/16 1043  Do not attempt resuscitation (DNR)   Continuous    Question Answer Comment  In the event of cardiac or respiratory ARREST Do not call a "code blue"   In the event of cardiac or respiratory ARREST Do not perform Intubation, CPR, defibrillation or ACLS   In the event of cardiac or respiratory ARREST Use medication by any route, position, wound care, and other measures to relive pain and suffering. May use oxygen, suction and manual treatment of airway obstruction as needed for comfort.      03/20/16 1042    Code Status History    Date Active Date Inactive Code Status Order ID Comments User Context   03/20/2016 10:29 AM 03/20/2016 10:42 AM Full Code [id number]  Delfino LovettVipul Shah, MD Inpatient   08/26/2014  5:12 PM 09/23/2014  5:41 PM Full Code [id number]  Leane Parapal Moore, CNA Inpatient   07/26/2014  7:55 PM 08/26/2014  5:12 PM Full Code [id number]  Coletta MemosKyle Cabbell, MD Inpatient     Family Communication: Spoke with the family  Yesterday Disposition Plan: Need to have diarrhea subside prior to disposition  Antibiotics:  Oral vancomycin  Time spent: 24 minutes  Alford HighlandWIETING, Zakhai Meisinger  [employer]

## 2016-03-25 NOTE — Care Management Important Message (Signed)
Important Message  Patient Details  Name: Stephanie Doyle MRN: [id number] Date of Birth: 1951-02-02   Medicare Important Message Given:  Yes    Maliek Schellhorn A, RN 03/25/2016, 11:16 AM

## 2016-03-25 NOTE — Progress Notes (Addendum)
0830: Patient having multiple loose stools for a few days now. Peri area very red and raw to point of breakdown. Dr. Renae GlossWieting consulted - may place rectal tube per nurse judgment.  Dr. Renae GlossWieting also notified re- PEG tube appearance - old residue and buildup present in tube. Dr. Renae GlossWieting stated that a GI consult is needed this week, but no one available at present due to holiday weekend.

## 2016-03-25 NOTE — Progress Notes (Signed)
Rectal tube inserted by Cloyd StagersSkyler Stone RN. Patent and in place. Patient tolerated procedure well.

## 2016-03-25 NOTE — Progress Notes (Signed)
1745: Patient uncomplaining most of the day. Tube feed infusing well. VS stable. HR varies 90's to 120's. Voiding - incontinent. Not willing to take anything orally today. Rectal tube working well and conserving her skin. Remains confused - asking for her sister. No distress noted.

## 2016-03-25 NOTE — Progress Notes (Signed)
1545: Peg tube dressing changed - insertion area cleaned with normal saline. New tube dressing placed.

## 2016-03-26 LAB — BASIC METABOLIC PANEL
Anion gap: 3 — ABNORMAL LOW (ref 5–15)
BUN: 14 mg/dL (ref 6–20)
CO2: 41 mmol/L — ABNORMAL HIGH (ref 22–32)
CREATININE: 0.57 mg/dL (ref 0.44–1.00)
Calcium: 8.1 mg/dL — ABNORMAL LOW (ref 8.9–10.3)
Chloride: 87 mmol/L — ABNORMAL LOW (ref 101–111)
GFR calc Af Amer: >60 mL/min (ref >60)
GLUCOSE: 157 mg/dL — AB (ref 65–99)
Potassium: 4.4 mmol/L (ref 3.5–5.1)
SODIUM: 131 mmol/L — AB (ref 135–145)

## 2016-03-26 LAB — MAGNESIUM: MAGNESIUM: 1.7 mg/dL (ref 1.7–2.4)

## 2016-03-26 LAB — CBC
HCT: 31.7 % — ABNORMAL LOW (ref 35.0–47.0)
Hemoglobin: 10.4 g/dL — ABNORMAL LOW (ref 12.0–16.0)
MCH: 27.9 pg (ref 26.0–34.0)
MCHC: 32.8 g/dL (ref 32.0–36.0)
MCV: 85.2 fL (ref 80.0–100.0)
PLATELETS: 207 10*3/uL (ref 150–440)
RBC: 3.72 MIL/uL — ABNORMAL LOW (ref 3.80–5.20)
RDW: 14.3 % (ref 11.5–14.5)
WBC: 28.1 10*3/uL — ABNORMAL HIGH (ref 3.6–11.0)

## 2016-03-26 LAB — GLUCOSE, CAPILLARY
GLUCOSE-CAPILLARY: 191 mg/dL — AB (ref 65–99)
GLUCOSE-CAPILLARY: 213 mg/dL — AB (ref 65–99)
GLUCOSE-CAPILLARY: 169 mg/dL — AB (ref 65–99)
GLUCOSE-CAPILLARY: 191 mg/dL — AB (ref 65–99)
Glucose-Capillary: 167 mg/dL — ABNORMAL HIGH (ref 65–99)

## 2016-03-26 MED ORDER — MAGNESIUM SULFATE 2 GM/50ML IV SOLN
2.0000 g | Freq: Once | INTRAVENOUS | Status: AC
  Administered 2016-03-26: 2 g via INTRAVENOUS
  Filled 2016-03-26: qty 50

## 2016-03-26 NOTE — Progress Notes (Signed)
Patient ID: Stephanie Doyle, female   DOB: Mar 18, 1951, 65 y.o.   MRN: [id number]  [facility] PROGRESS NOTE  Stephanie Doyle [id number] DOB: Mar 18, 1951 DOA: 03/20/2016 PCP: Stephanie CrutchPradeep Ramachandran, MD  HPI/Subjective: Patient never offers any complaints. Answers no to all questions. Nursing staff had a place a rectal tube yesterday.  Objective: Filed Vitals:   03/26/16 0800 03/26/16 1226  BP:  121/69  Pulse:  98  Temp: 98.2 F (36.8 C) 98.4 F (36.9 C)  Resp:  24    Filed Weights   03/24/16 0500 03/25/16 0446 03/26/16 0457  Weight: 68.856 kg (151 lb 12.8 oz) 69.083 kg (152 lb 4.8 oz) 69.763 kg (153 lb 12.8 oz)    ROS: Review of Systems  Constitutional: Negative for fever and chills.  Eyes: Negative for blurred vision.  Respiratory: Negative for cough and shortness of breath.   Cardiovascular: Negative for chest pain.  Gastrointestinal: Positive for diarrhea. Negative for nausea, vomiting, abdominal pain and constipation.  Genitourinary: Negative for dysuria.  Musculoskeletal: Negative for joint pain.  Neurological: Negative for dizziness and headaches.   Exam: Physical Exam  HENT:  Nose: No mucosal edema.  Mouth/Throat: No oropharyngeal exudate or posterior oropharyngeal edema.  Eyes: Conjunctivae, EOM and lids are normal. Pupils are equal, round, and reactive to light.  Neck: No JVD present. Carotid bruit is not present. No edema present. No thyroid mass and no thyromegaly present.  Cardiovascular: S1 normal and S2 normal.  Tachycardia present.  Exam reveals no gallop.   No murmur heard. Pulses:      Dorsalis pedis pulses are 2+ on the right side, and 2+ on the left side.  Respiratory: No accessory muscle usage. No respiratory distress. She has no decreased breath sounds. She has no wheezes. She has rhonchi in the right lower field and the left lower field. She has no rales.  GI: Soft. Bowel sounds are normal. There is no tenderness.  Musculoskeletal:       Right  ankle: She exhibits swelling.       Left ankle: She exhibits swelling.  Lymphadenopathy:    She has no cervical adenopathy.  Neurological: She is alert.  Skin: Skin is warm. Nails show no clubbing.  This morning her entire body was red. This afternoon when I reevaluated her it had faded.  Psychiatric: She has a normal mood and affect.      Data Reviewed: Basic Metabolic Panel:  Recent Labs Lab 03/20/16 1112 03/21/16 0415 03/22/16 0338 03/24/16 0456 03/26/16 0513  NA 146* 145 145 137 131*  K 4.7 4.1 4.3 4.2 4.4  CL 104 111 108 91* 87*  CO2 35* 32 36* 43* 41*  GLUCOSE 398* 135* 171* 165* 157*  BUN 28* 28* 29* 17 14  CREATININE 0.85 0.61 0.67 0.59 0.57  CALCIUM 8.0* 7.7* 8.2* 8.1* 8.1*  MG  --   --  2.4  --  1.7   Liver Function Tests:  Recent Labs Lab 03/20/16 0634 03/20/16 1112  AST 30 24  ALT 35 31  ALKPHOS 131* 116  BILITOT <0.1* 0.2*  PROT 5.0* 5.0*  ALBUMIN 2.2* 2.1*    Recent Labs Lab 03/20/16 0634  LIPASE 23   CBC:  Recent Labs Lab 03/20/16 0634 03/20/16 1112 03/21/16 0415 03/22/16 0338 03/24/16 0456 03/25/16 0417 03/26/16 0513  WBC 55.3* 56.3* 48.1* 26.7* 23.2* 36.1* 28.1*  NEUTROABS 52.5* 52.9*  --   --   --   --   --   HGB 11.2*  11.0* 10.7* 11.7* 10.9* 10.1* 10.4*  HCT 35.1 34.2* 33.8* 36.2 33.7* 30.8* 31.7*  MCV 86.3 85.8 86.6 85.7 84.2 83.2 85.2  PLT 308 306 277 248 188 190 207   Cardiac Enzymes:  Recent Labs Lab 03/20/16 0634 03/20/16 1112  TROPONINI 0.13* 0.24*    CBG:  Recent Labs Lab 03/25/16 1935 03/25/16 2342 03/26/16 0342 03/26/16 0752 03/26/16 1131  GLUCAP 200* 146* 167* 191* 213*    Recent Results (from the past 240 hour(s))  Culture, blood (Routine X 2)     Status: None   Collection Time: 03/20/16  6:35 AM  Result Value Ref Range Status   Specimen Description BLOOD RIGHT FOREARM  Final   Special Requests   Final    BOTTLES DRAWN AEROBIC AND ANAEROBIC AER ANA   Culture NO GROWTH 5 DAYS  Final    Report Status 03/25/2016 FINAL  Final  Culture, blood (Routine X 2)     Status: None   Collection Time: 03/20/16  6:38 AM  Result Value Ref Range Status   Specimen Description BLOOD LEFT WRIST  Final   Special Requests   Final    BOTTLES DRAWN AEROBIC AND ANAEROBIC AER ANA   Culture NO GROWTH 5 DAYS  Final   Report Status 03/25/2016 FINAL  Final  Urine culture     Status: None   Collection Time: 03/20/16  8:40 AM  Result Value Ref Range Status   Specimen Description URINE, RANDOM  Final   Special Requests NONE  Final   Culture NO GROWTH Performed at [facility]   Final   Report Status 03/21/2016 FINAL  Final  MRSA PCR Screening     Status: None   Collection Time: 03/20/16 10:55 AM  Result Value Ref Range Status   MRSA by PCR NEGATIVE NEGATIVE Final    Comment:        The GeneXpert MRSA Assay (FDA approved for NASAL specimens only), is one component of a comprehensive MRSA colonization surveillance program. It is not intended to diagnose MRSA infection nor to guide or monitor treatment for MRSA infections.       Scheduled Meds: . antiseptic oral rinse  7 mL Mouth Rinse q12n4p  . budesonide (PULMICORT) nebulizer solution  0.25 mg Nebulization BID  . chlorhexidine  15 mL Mouth Rinse BID  . clonazePAM  0.5 mg Oral BID  . enoxaparin (LOVENOX) injection  40 mg Subcutaneous Q24H  . free water  200 mL Per Tube Q8H  . insulin aspart  0-15 Units Subcutaneous Q4H  . insulin glargine  7 Units Subcutaneous QHS  . ipratropium-albuterol  3 mL Inhalation TID  . levETIRAcetam  500 mg Oral BID  . levothyroxine  25 mcg Oral QAC breakfast  . magnesium sulfate 1 - 4 g bolus IVPB  2 g Intravenous Once  . metoprolol  50 mg Oral BID  . multivitamin with minerals  1 tablet Oral Daily  . predniSONE  5 mg Per Tube Q breakfast  . rosuvastatin  20 mg Oral Q24H  . saccharomyces boulardii  250 mg Oral BID  . sodium chloride flush  3 mL Intravenous Q12H  . vancomycin  500 mg  Oral Q6H   Continuous Infusions: . feeding supplement (GLUCERNA 1.2 CAL) 1,000 mL (03/26/16 1317)    Assessment/Plan:  1. Sepsis secondary to C. difficile colitis. Mental status improved. Still with leukocytosis. White blood cell count 28.1 today. Patient having a lot of diarrhea today and has  rectal tube. I would like to see white count come down a little bit more and diarrhea to subside prior to disposition. Continue oral vancomycin. 2. Acute on chronic respiratory failure. Likely COPD exacerbation. Continue oxygen. Added budesonide nebulizers.  3. Type 2 diabetes mellitus. Sugars are trending better. Patient is on low-dose Lantus and sliding scale. 4. Seizure disorder on Keppra 5. Hypothyroidism unspecified on levothyroxine 6. Essential hypertension and tachycardia on metoprolol 7. Hyperlipidemia unspecified on Crestor 8. Hypernatremia on free water via PEG tube feedings 9. Appreciate palliative care consultation. Patient is a DO NOT RESUSCITATE.   Code Status:     Code Status Orders        Start     Ordered   03/20/16 1043  Do not attempt resuscitation (DNR)   Continuous    Question Answer Comment  In the event of cardiac or respiratory ARREST Do not call a "code blue"   In the event of cardiac or respiratory ARREST Do not perform Intubation, CPR, defibrillation or ACLS   In the event of cardiac or respiratory ARREST Use medication by any route, position, wound care, and other measures to relive pain and suffering. May use oxygen, suction and manual treatment of airway obstruction as needed for comfort.      03/20/16 1042    Code Status History    Date Active Date Inactive Code Status Order ID Comments User Context   03/20/2016 10:29 AM 03/20/2016 10:42 AM Full Code [id number]  Delfino LovettVipul Shah, MD Inpatient   08/26/2014  5:12 PM 09/23/2014  5:41 PM Full Code [id number]  Leane Parapal Moore, CNA Inpatient   07/26/2014  7:55 PM 08/26/2014  5:12 PM Full Code [id number]  Coletta MemosKyle Cabbell, MD Inpatient      Family Communication: Spoke with the family Today at the bedside Disposition Plan: Need to have diarrhea subside prior to disposition  Antibiotics:  Oral vancomycin  Time spent: 26 minutes  Alford HighlandWIETING, Elyza Whitt  [employer]

## 2016-03-26 NOTE — Care Management Note (Addendum)
Case Management Note  Patient Details  Name: Stephanie Doyle MRN: [id number] Date of Birth: 1951-01-02  Subjective/Objective:     Discharge planning: [name]  [email or web address]. Maralyn SagoSarah, CSW was updated about this addition to previous discharge plan back to SNF.               Action/Plan:   Expected Discharge Date:                  Expected Discharge Plan:     In-House Referral:     Discharge planning Services     Post Acute Care Choice:    Choice offered to:     DME Arranged:    DME Agency:     HH Arranged:    HH Agency:     Status of Service:     If discussed at [employer] Length of Stay Meetings, dates discussed:    Additional Comments:  Dj Senteno A, RN 03/26/2016, 12:36 PM

## 2016-03-26 NOTE — Progress Notes (Signed)
New referral for hospice at [id number] upon discharge from [facility]. Patient is 65yo female who was admitted to [facility] on 6.27.17 with Dx-sepsis,  c-diff.  Patient has past medical history of [facility]/2014 and has been residing at [facility] since 08/2014.  She has PEG, reversed Trach, residual brain injury, COPD, and fungemia. Rectal tube was placed today for continued diarrhea.  Patient is post palliative medicine consult and family request to send the patient back to [facility] place post treatment with hospice services.  I met with patient's sister, Vaughan Basta shue/HCPOA, and [name]'s husband Young Berry to discuss hospice services.  She is in agreement with and wants to proceed with hospice at the facility post discharge.  Patient is alert and confused.  Family is uncertain at this time if they want to discontinue PEG feedings, but we discussed this openly and questions were answered.  Palliative medicine puts patient's prognosis at <4 weeks. I relayed this information to the family and we discussed EOL care, quality of life vs. Quantity and talked through goals of care.  The sister wants to proceed with comfort care, but states she is worried about what people will say if they stop PEG feedings.  Family will need further education and support on this topic.  Patient will not plan on discharging until treatment for C-diff is completed. Patient is discharging with portable DNR which is new this hospitalization.  Updated information faxed to referral intake.  Will continue to follow through final disposition. Thank you for allowing participation in this patient's care.  Dimas Aguas ,RN Clinical Nurse Liaison [facility] [phone]

## 2016-03-26 NOTE — Care Management Note (Addendum)
Case Management Note  Patient Details  Name: Stephanie Doyle MRN: [id number] Date of Birth: January 21, 1951  Subjective/Objective:       Discussed Hospice services briefly with sister [name] cell [phone]. [name] chose [facility], if after talking with Dr Renae GlossWieting and [name] today, that they decide upon hospice services.  [name] is undecided about whether [name] needs [facility] placement or return to [facility] with Hospice services.  [name] was updated and is meeting with family today.. Rectal tube placed today. Feedings through PEG continue.           Action/Plan:   Expected Discharge Date:                  Expected Discharge Plan:     In-House Referral:     Discharge planning Services     Post Acute Care Choice:    Choice offered to:     DME Arranged:    DME Agency:     HH Arranged:    HH Agency:     Status of Service:     If discussed at [employer] Length of Stay Meetings, dates discussed:    Additional Comments:  Siddhi Dornbush A, RN 03/26/2016, 11:00 AM

## 2016-03-27 LAB — GLUCOSE, CAPILLARY
GLUCOSE-CAPILLARY: 155 mg/dL — AB (ref 65–99)
GLUCOSE-CAPILLARY: 157 mg/dL — AB (ref 65–99)
GLUCOSE-CAPILLARY: 179 mg/dL — AB (ref 65–99)
Glucose-Capillary: 147 mg/dL — ABNORMAL HIGH (ref 65–99)
Glucose-Capillary: 162 mg/dL — ABNORMAL HIGH (ref 65–99)
Glucose-Capillary: 182 mg/dL — ABNORMAL HIGH (ref 65–99)
Glucose-Capillary: 195 mg/dL — ABNORMAL HIGH (ref 65–99)

## 2016-03-27 MED ORDER — LEVETIRACETAM 100 MG/ML PO SOLN
500.0000 mg | Freq: Two times a day (BID) | ORAL | Status: DC
  Administered 2016-03-27 – 2016-03-29 (×4): 500 mg via ORAL
  Filled 2016-03-27 (×4): qty 5

## 2016-03-27 NOTE — Progress Notes (Signed)
Patient ID: Stephanie Doyle, female   DOB: 1950-10-16, 65 y.o.   MRN: [id number] [employer] PROGRESS NOTE  Stephanie Doyle HYQ:[id number] DOB: Feb 17, 1951 DOA: 03/20/2016 PCP: Shane Crutch, MD  HPI/Subjective: Patient never offers any complaints.  Still has rectal tube in.  Objective: Filed Vitals:   03/27/16 0434 03/27/16 0945  BP: 124/49 118/67  Pulse: 110 98  Temp: 97.6 F (36.4 C) 98.1 F (36.7 C)  Resp:      Filed Weights   03/25/16 0446 03/26/16 0457 03/27/16 0434  Weight: 69.083 kg (152 lb 4.8 oz) 69.763 kg (153 lb 12.8 oz) 68.947 kg (152 lb)    ROS: Review of Systems  Constitutional: Negative for fever and chills.  Eyes: Negative for blurred vision.  Respiratory: Negative for cough and shortness of breath.   Cardiovascular: Negative for chest pain.  Gastrointestinal: Negative for nausea, vomiting, abdominal pain, diarrhea and constipation.  Genitourinary: Negative for dysuria.  Musculoskeletal: Negative for joint pain.  Neurological: Negative for dizziness and headaches.   Exam: Physical Exam  HENT:  Nose: No mucosal edema.  Mouth/Throat: No oropharyngeal exudate or posterior oropharyngeal edema.  Eyes: Conjunctivae and lids are normal. Pupils are equal, round, and reactive to light.  Neck: No JVD present. Carotid bruit is not present. No edema present. No thyroid mass and no thyromegaly present.  Cardiovascular: S1 normal and S2 normal.  Exam reveals no gallop.   No murmur heard. Pulses:      Dorsalis pedis pulses are 2+ on the right side, and 2+ on the left side.  Respiratory: Accessory muscle usage present. No respiratory distress. She has wheezes in the right lower field and the left lower field. She has no rhonchi. She has no rales.  GI: Soft. Bowel sounds are normal. There is no tenderness.  Musculoskeletal:       Right ankle: She exhibits swelling.       Left ankle: She exhibits swelling.  Lymphadenopathy:    She has no cervical adenopathy.   Neurological: She is alert.  Positive tremor  Skin: Skin is warm. No rash noted. Nails show no clubbing.  Psychiatric: She has a normal mood and affect.      Data Reviewed: Basic Metabolic Panel:  Recent Labs Lab 03/21/16 0415 03/22/16 0338 03/24/16 0456 03/26/16 0513  NA 145 145 137 131*  K 4.1 4.3 4.2 4.4  CL 111 108 91* 87*  CO2 32 36* 43* 41*  GLUCOSE 135* 171* 165* 157*  BUN 28* 29* 17 14  CREATININE 0.61 0.67 0.59 0.57  CALCIUM 7.7* 8.2* 8.1* 8.1*  MG  --  2.4  --  1.7   CBC:  Recent Labs Lab 03/21/16 0415 03/22/16 0338 03/24/16 0456 03/25/16 0417 03/26/16 0513  WBC 48.1* 26.7* 23.2* 36.1* 28.1*  HGB 10.7* 11.7* 10.9* 10.1* 10.4*  HCT 33.8* 36.2 33.7* 30.8* 31.7*  MCV 86.6 85.7 84.2 83.2 85.2  PLT 277 248 188 190 207    CBG:  Recent Labs Lab 03/26/16 2101 03/27/16 0014 03/27/16 0326 03/27/16 0739 03/27/16 1128  GLUCAP 191* 147* 162* 182* 179*    Recent Results (from the past 240 hour(s))  Culture, blood (Routine X 2)     Status: None   Collection Time: 03/20/16  6:35 AM  Result Value Ref Range Status   Specimen Description BLOOD RIGHT FOREARM  Final   Special Requests   Final    BOTTLES DRAWN AEROBIC AND ANAEROBIC AER ANA   Culture NO GROWTH 5  DAYS  Final   Report Status 03/25/2016 FINAL  Final  Culture, blood (Routine X 2)     Status: None   Collection Time: 03/20/16  6:38 AM  Result Value Ref Range Status   Specimen Description BLOOD LEFT WRIST  Final   Special Requests   Final    BOTTLES DRAWN AEROBIC AND ANAEROBIC AER 6ML ANA 2ML   Culture NO GROWTH 5 DAYS  Final   Report Status 03/25/2016 FINAL  Final  Urine culture     Status: None   Collection Time: 03/20/16  8:40 AM  Result Value Ref Range Status   Specimen Description URINE, RANDOM  Final   Special Requests NONE  Final   Culture NO GROWTH [facility]   Final   Report Status 03/21/2016 FINAL  Final  MRSA PCR Screening     Status: None    Collection Time: 03/20/16 10:55 AM  Result Value Ref Range Status   MRSA by PCR NEGATIVE NEGATIVE Final    Comment:        The GeneXpert MRSA Assay (FDA approved for NASAL specimens only), is one component of a comprehensive MRSA colonization surveillance program. It is not intended to diagnose MRSA infection nor to guide or monitor treatment for MRSA infections.      Scheduled Meds: . antiseptic oral rinse  7 mL Mouth Rinse q12n4p  . budesonide (PULMICORT) nebulizer solution  0.25 mg Nebulization BID  . chlorhexidine  15 mL Mouth Rinse BID  . clonazePAM  0.5 mg Oral BID  . enoxaparin (LOVENOX) injection  40 mg Subcutaneous Q24H  . free water  200 mL Per Tube Q8H  . insulin aspart  0-15 Units Subcutaneous Q4H  . insulin glargine  7 Units Subcutaneous QHS  . ipratropium-albuterol  3 mL Inhalation TID  . levETIRAcetam  500 mg Oral BID  . levothyroxine  25 mcg Oral QAC breakfast  . metoprolol  50 mg Oral BID  . multivitamin with minerals  1 tablet Oral Daily  . predniSONE  5 mg Per Tube Q breakfast  . rosuvastatin  20 mg Oral Q24H  . saccharomyces boulardii  250 mg Oral BID  . sodium chloride flush  3 mL Intravenous Q12H  . vancomycin  500 mg Oral Q6H   Continuous Infusions: . feeding supplement (GLUCERNA 1.2 CAL) 1,000 mL (03/27/16 0615)    Assessment/Plan:  1. Sepsis secondary to C. difficile colitis. Mental status improved. White blood cell count still elevated. Patient has a rectal tube in. Rectal tube will need to come out prior to disposition. Continue oral vancomycin 2. Acute on chronic respiratory failure and COPD exacerbation. Continue oxygen supplementation. Continue nebulizers and budesonide. Patient on low-dose prednisone. 3. Type 2 diabetes mellitus. Patient is on low-dose Lantus and sliding scale 4. Seizure disorder on Keppra 5. Hypothyroidism unspecified on levothyroxine 6. Essential hypertension and tachycardia on metoprolol 7. Hyperlipidemia unspecified  on Crestor 8. Hypernatremia on free water via PEG tube feeding 9. Appreciate palliative care consultation. Patient is a DO NOT RESUSCITATE  Code Status:     Code Status Orders        Start     Ordered   03/20/16 1043  Do not attempt resuscitation (DNR)   Continuous    Question Answer Comment  In the event of cardiac or respiratory ARREST Do not call a "code blue"   In the event of cardiac or respiratory ARREST Do not perform Intubation, CPR, defibrillation or ACLS   In  the event of cardiac or respiratory ARREST Use medication by any route, position, wound care, and other measures to relive pain and suffering. May use oxygen, suction and manual treatment of airway obstruction as needed for comfort.      03/20/16 1042    Code Status History    Date Active Date Inactive Code Status Order ID Comments User Context   03/20/2016 10:29 AM 03/20/2016 10:42 AM Full Code [id number]  Delfino LovettVipul Shah, MD Inpatient   08/26/2014  5:12 PM 09/23/2014  5:41 PM Full Code [id number]  Leane Parapal Moore, CNA Inpatient   07/26/2014  7:55 PM 08/26/2014  5:12 PM Full Code [id number]  Coletta MemosKyle Cabbell, MD Inpatient     Family Communication: Spoke with family yesterday Disposition Plan: Back to facility with palliative care once white count comes down and rectal tube comes out  Antibiotics:  Oral vancomycin  Time spent: 35 minutes  Alford HighlandWIETING, Shoni Quijas  [employer]

## 2016-03-27 NOTE — Care Management Important Message (Signed)
Important Message  Patient Details  Name: Stephanie Doyle MRN: [id number] Date of Birth: [birth year]   Medicare Important Message Given:  Yes    Stephanie GreetBrenda S Sadiel Mota, RN 03/27/2016, 10:00 AM

## 2016-03-28 LAB — BASIC METABOLIC PANEL
ANION GAP: 6 (ref 5–15)
BUN: 15 mg/dL (ref 6–20)
CHLORIDE: 84 mmol/L — AB (ref 101–111)
CO2: 37 mmol/L — AB (ref 22–32)
Calcium: 8.2 mg/dL — ABNORMAL LOW (ref 8.9–10.3)
Creatinine, Ser: 0.5 mg/dL (ref 0.44–1.00)
GFR calc Af Amer: >60 mL/min (ref >60)
GFR calc non Af Amer: >60 mL/min (ref >60)
GLUCOSE: 178 mg/dL — AB (ref 65–99)
POTASSIUM: 5.2 mmol/L — AB (ref 3.5–5.1)
Sodium: 127 mmol/L — ABNORMAL LOW (ref 135–145)

## 2016-03-28 LAB — CBC
HEMATOCRIT: 30.4 % — AB (ref 35.0–47.0)
HEMOGLOBIN: 9.9 g/dL — AB (ref 12.0–16.0)
MCH: 27.1 pg (ref 26.0–34.0)
MCHC: 32.5 g/dL (ref 32.0–36.0)
MCV: 83.3 fL (ref 80.0–100.0)
Platelets: 342 10*3/uL (ref 150–440)
RBC: 3.65 MIL/uL — AB (ref 3.80–5.20)
RDW: 14.4 % (ref 11.5–14.5)
WBC: 49.7 10*3/uL — AB (ref 3.6–11.0)

## 2016-03-28 LAB — GLUCOSE, CAPILLARY
GLUCOSE-CAPILLARY: 178 mg/dL — AB (ref 65–99)
GLUCOSE-CAPILLARY: 203 mg/dL — AB (ref 65–99)
GLUCOSE-CAPILLARY: 227 mg/dL — AB (ref 65–99)
GLUCOSE-CAPILLARY: 177 mg/dL — AB (ref 65–99)
Glucose-Capillary: 153 mg/dL — ABNORMAL HIGH (ref 65–99)

## 2016-03-28 LAB — MAGNESIUM: Magnesium: 1.9 mg/dL (ref 1.7–2.4)

## 2016-03-28 LAB — LACTIC ACID, PLASMA: LACTIC ACID, VENOUS: 1.1 mmol/L (ref 0.5–1.9)

## 2016-03-28 MED ORDER — SODIUM CHLORIDE 0.9 % IV SOLN
INTRAVENOUS | Status: AC
  Administered 2016-03-28 – 2016-03-29 (×3): via INTRAVENOUS

## 2016-03-28 MED ORDER — FIDAXOMICIN 200 MG PO TABS
200.0000 mg | ORAL_TABLET | Freq: Two times a day (BID) | ORAL | Status: DC
  Administered 2016-03-28 – 2016-03-29 (×3): 200 mg via ORAL
  Filled 2016-03-28 (×4): qty 1

## 2016-03-28 NOTE — Consult Note (Signed)
GI Inpatient Consult Note  Reason for Consult: C diff infection   Attending Requesting Consult: Dr. Delfino Lovett  History of Present Illness: Stephanie Doyle is a 65 y.o. 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  Patient was transported to the [facility] ED from [facility], her long-term care facility, on 6/20 due to tachypnea, tachycardia, crackles ausculted in lung fields, low sats, productive cough, and difficulty breathing.  Symptoms initially improved as an outpatient with IM Solumedrol, but worsened a few days later.  Patient also reported mild abdomina discomfort and diarrhea.  PNA was ruled out by CXR, so breathing difficulty considered 2/2 COPD exacerbation.  Sepsis considered secondary to + C diff test, as leukocytosis persists with negative blood cultures.  Patient has received oral vancomycin since 6/27 w/o improvement, so GI consultation was requested.  A rectal tube is currently in place due to frequent watery BMs and patient's inability to ambulate.  Of note, patient received a palliative care consultation due to declining functional status.  Patient's prognosis is overall quite poor, with life expectany <4 weeks per palliative care.  History is quite difficult to obtain from patient, who is quite drowsy during my visit.  Diarrhea continues via rectal tube.  She answers "no" when full GI ROS performed.  No family at bedside at this time.  Past Medical History:  Past Medical History  Diagnosis Date  . COPD (chronic obstructive pulmonary disease) (HCC)     Problem List: Patient Active Problem List   Diagnosis Date Noted  . Palliative care encounter 03/21/2016  . DNR (do not resuscitate) 03/21/2016  . Sepsis (HCC) 03/20/2016  . Dyspnea   . Metabolic encephalopathy   . Clostridium difficile  diarrhea   . Hyperglycemia   . Tracheostomy status (HCC)   . Hypernatremia 08/19/2014  . Acute respiratory failure with hypoxia (HCC)   . HCAP (healthcare-associated pneumonia)   . Acute on chronic respiratory failure (HCC) 08/11/2014  . Fungemia 08/11/2014  . Hypomagnesemia 08/01/2014  . Hypophosphatemia 08/01/2014  . Subarachnoid hemorrhage (HCC)   . Acute respiratory failure (HCC)   . Absolute anemia   . COPD, moderate (HCC) 05/18/2014    Past Surgical History: Past Surgical History  Procedure Laterality Date  . Craniotomy Right 07/27/2014    Procedure: CRANIOTOMY INTRACRANIAL ANEURYSM FOR Pterional Aneurysm;  Surgeon: Lisbeth Renshaw, MD;  Location: [facility] NEURO ORS;  Service: Neurosurgery;  Laterality: Right;  . Esophagogastroduodenoscopy (egd) with propofol N/A 08/05/2014    Procedure: ESOPHAGOGASTRODUODENOSCOPY (EGD) WITH PROPOFOL;  Surgeon: Violeta Gelinas, MD;  Location: [facility] ENDOSCOPY;  Service: General;  Laterality: N/A;  . Peg placement N/A 08/05/2014    Procedure: PERCUTANEOUS ENDOSCOPIC GASTROSTOMY (PEG) PLACEMENT;  Surgeon: Violeta Gelinas, MD;  Location: [facility] ENDOSCOPY;  Service: General;  Laterality: N/A;  bedside/tech only    Allergies: Allergies  Allergen Reactions  . Augmentin [Amoxicillin-Pot Clavulanate] Other (See Comments)    Reaction: unknown  . Vancomycin     Pt had red rash all over body after vanc was given, this was told to me in report but had not been added to the allergy list     Home Medications: Prescriptions prior to admission  Medication Sig Dispense Refill Last Dose  . acetaminophen (TYLENOL) 325 MG tablet Take 650 mg by mouth every 4 (four) hours as needed.  prn at prn  . albuterol-ipratropium (COMBIVENT) 18-103 MCG/ACT inhaler Inhale 1 puff into the lungs 4 (four) times daily.   03/19/2016 at 0800  . antiseptic oral rinse (BIOTENE) LIQD 15 mLs by Mouth Rinse route 4 (four) times daily. Rinse for 30 seconds and then spit out   03/19/2016 at 1700  .  clonazePAM (KLONOPIN) 0.5 MG tablet Take 0.5 mg by mouth 2 (two) times daily.   03/19/2016 at 2000  . diphenhydrAMINE (BENADRYL) 25 mg capsule 25 mg by Gastric Tube route every 4 (four) hours as needed for itching.   prn at prn  . famotidine (PEPCID) 20 MG tablet Take 20 mg by mouth 2 (two) times daily.   03/19/2016 at 1700  . hydrocortisone cream 1 % Apply 1 application topically 4 (four) times daily as needed for itching. Apply a thin film to clean, dry hemorrhoids four times a day as needed   prn at prn  . ipratropium-albuterol (DUONEB) 0.5-2.5 (3) MG/3ML SOLN Take 3 mLs by nebulization 3 (three) times daily.   03/19/2016 at 1600  . levalbuterol (XOPENEX) 1.25 MG/3ML nebulizer solution Take 1.25 mg by nebulization 3 (three) times daily.   03/19/2016 at 2000  . levalbuterol (XOPENEX) 1.25 MG/3ML nebulizer solution Take 1.25 mg by nebulization every 4 (four) hours as needed for wheezing.   prn at prn  . levETIRAcetam (KEPPRA) 500 MG tablet Take 500 mg by mouth 2 (two) times daily.   03/19/2016 at 1700  . levothyroxine (SYNTHROID, LEVOTHROID) 25 MCG tablet Take 25 mcg by mouth daily before breakfast.   03/19/2016 at 0600  . [EXPIRED] Linezolid in Sodium Chloride 600-0.9 MG/300ML-% SOLN Inject 300 mLs into the vein every 12 (twelve) hours.   03/19/2016 at 1200  . metoprolol (LOPRESSOR) 50 MG tablet Take 50 mg by mouth 2 (two) times daily.   03/19/2016 at 1700  . [EXPIRED] metroNIDAZOLE (FLAGYL) 500 MG tablet Take 500 mg by mouth every 8 (eight) hours.   03/19/2016 at 2200  . morphine (ROXANOL) 20 MG/ML concentrated solution Take 5-10 mg by mouth every hour as needed for severe pain.   prn at prn  . Multiple Vitamin (MULTIVITAMIN WITH MINERALS) TABS tablet Take 1 tablet by mouth daily.   03/19/2016 at 0800  . Nutritional Supplements (FEEDING SUPPLEMENT, JEVITY 1.5 CAL,) LIQD 237 mLs by Gastric Tube route 4 (four) times daily.   03/19/2016 at 2100  . Nutritional Supplements (FEEDING SUPPLEMENT, JEVITY 1.5 CAL,)  LIQD 237 mLs by Gastric Tube route daily. (extra feeding is for Blood Glucose support)   03/19/2016 at 0300  . predniSONE (DELTASONE) 5 MG tablet Take 5 mg by mouth daily with breakfast.   03/19/2016 at 0800  . rosuvastatin (CRESTOR) 20 MG tablet Take 20 mg by mouth daily.   03/19/2016 at 2100  . saccharomyces boulardii (FLORASTOR) 250 MG capsule Take 250 mg by mouth 2 (two) times daily.   03/19/2016 at 1700  . [EXPIRED] sodium chloride 0.9 % infusion Inject 50 mLs into the vein continuous. Give at 9950ml/hour for 48 hours for hyponatremia   03/19/2016 at 1100  . zinc oxide (BALMEX) 11.3 % CREA cream Apply 1 application topically as needed. Apply liberal amount to areas of skin irritation as needed   prn at prn  . zinc oxide 20 % ointment Apply 1 application topically 2 (two) times daily as needed for irritation. Apply a thin film to bottom twice daily and as needed   03/19/2016 at 2000   Home medication reconciliation was  completed with the patient.   Scheduled Inpatient Medications:   . antiseptic oral rinse  7 mL Mouth Rinse q12n4p  . budesonide (PULMICORT) nebulizer solution  0.25 mg Nebulization BID  . chlorhexidine  15 mL Mouth Rinse BID  . clonazePAM  0.5 mg Oral BID  . enoxaparin (LOVENOX) injection  40 mg Subcutaneous Q24H  . fidaxomicin  200 mg Oral BID  . free water  200 mL Per Tube Q8H  . insulin aspart  0-15 Units Subcutaneous Q4H  . insulin glargine  7 Units Subcutaneous QHS  . ipratropium-albuterol  3 mL Inhalation TID  . levETIRAcetam  500 mg Oral BID  . levothyroxine  25 mcg Oral QAC breakfast  . metoprolol  50 mg Oral BID  . multivitamin with minerals  1 tablet Oral Daily  . predniSONE  5 mg Per Tube Q breakfast  . rosuvastatin  20 mg Oral Q24H  . saccharomyces boulardii  250 mg Oral BID  . sodium chloride flush  3 mL Intravenous Q12H  . vancomycin  500 mg Oral Q6H    Continuous Inpatient Infusions:   . sodium chloride 150 mL/hr at 03/28/16 1046  . feeding supplement  (GLUCERNA 1.2 CAL) 1,000 mL (03/28/16 1010)    PRN Inpatient Medications:  acetaminophen **OR** acetaminophen, HYDROcodone-acetaminophen, labetalol, levalbuterol, ondansetron **OR** ondansetron (ZOFRAN) IV, traZODone  Family History: family history is not on file.  Social History:   reports that she has never smoked. She does not have any smokeless tobacco history on file. She reports that she does not drink alcohol.   Review of Systems: No obtained d/t patient drowsiness   Physical Examination: BP 105/57 mmHg  Pulse 112  Temp(Src) 98.3 F (36.8 C) (Oral)  Resp 22  Ht 5' (1.524 m)  Wt 68.04 kg (150 lb)  BMI 29.30 kg/m2  SpO2 98% Gen: NAD, resting comfortably in bed HEENT: PEERLA, EOMI Neck: supple, no JVD or thyromegaly Chest: CTA bilaterally, no wheezes, crackles, or other adventitious sounds CV: RRR, no m/g/c/r Abd: soft, NT, ND, +BS in all four quadrants; no HSM, guarding, ridigity, or rebound tenderness Ext: 1+ LE edema bilaterally, well perfused with 2+ pulses Skin: no rash or lesions noted Lymph: no LAD  Data: Lab Results  Component Value Date   WBC 49.7* 03/28/2016   HGB 9.9* 03/28/2016   HCT 30.4* 03/28/2016   MCV 83.3 03/28/2016   PLT 342 03/28/2016    Recent Labs Lab 03/25/16 0417 03/26/16 0513 03/28/16 0609  HGB 10.1* 10.4* 9.9*   Lab Results  Component Value Date   NA 127* 03/28/2016   K 5.2* 03/28/2016   CL 84* 03/28/2016   CO2 37* 03/28/2016   BUN 15 03/28/2016   CREATININE 0.50 03/28/2016   Lab Results  Component Value Date   ALT 31 03/20/2016   AST 24 03/20/2016   ALKPHOS 116 03/20/2016   BILITOT 0.2* 03/20/2016   No results for input(s): APTT, INR, PTT in the last 168 hours.   Assessment/Plan: Ms. Beirne is a 65 y.o. 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   Watery diarrhea and leukocytosis persist despite treatment with vancomycin x 8 days.  Today, WBCs 49 (28 on 7/3) with tachypnea and tachycardia.  Patient's overall prognosis is quite poor due to multiple medical comorbidities and recent decline in functional status.  Agree with  switch to Deficid, may d/c vanc. Additional recs below.  Also discussed patient with Dr. Mechele CollinElliott - see his note for additional recommendations.  Recommendations: - Continue Deficid 200mg  BID - No indication for stool transplant at this time d/t declining health  Thank you for the consult. We will follow along with you. Please call with questions or concerns.  Burman FreestoneMichelle C Leya Paige, PA-C [facility] Phone: [phone] Pager: [phone]

## 2016-03-28 NOTE — Progress Notes (Signed)
Follow up visit made to new referral for [facility] services following discharge. Writer spoke this morning with attending physicain Dr. Shah who related that patient's condition has continued to deteriorate and that this information had been relayed to patient's sister [name] via telephone. Writer met with [name] and her husband as well as brother Steven to discuss their choice of patient returning to [facility] vs the Hospice Home.  °Family would like for her to return to the [facility] with hospice following. [name] stated her understanding that the IV antibiotics and IVF would be discontinued and that the focus will be on comfort. Tube feedings will continue, but will be readdressed by the hospice team. Updated information faxed to referral. Rectal tube to remains, family in agreement. Hospital care team made aware. Patient is a DNR and will need a signed portable DNR in place for transport at discharge.Thank you. °Karen Robertson RN, BSN, CHPN °[facility], Hospital Liaison °[phone] c °

## 2016-03-28 NOTE — Plan of Care (Signed)
Problem: Bowel/Gastric: Goal: Will not experience complications related to bowel motility Outcome: Not Progressing Continues to have liquid stools per rectal tube. Continues on tube feeds

## 2016-03-28 NOTE — Progress Notes (Signed)
Patient ID: Stephanie SilvasVicky D Doyle, female   DOB: May 13, 1951, 65 y.o.   MRN: [id number] [employer] [facility] PROGRESS NOTE  Stephanie SilvasVicky D Doyle OZH:[id number] DOB: May 13, 1951 DOA: 03/20/2016 PCP: [name], MD  HPI/Subjective: Seem to be getting septic. BP low, HR 147, worsening leukocytosis, still pouring watery stool in rectal tube. No new c/o  Objective: Filed Vitals:   03/28/16 0447 03/28/16 0844  BP: 97/58 138/47  Pulse: 147 137  Temp: 100.2 F (37.9 C) 99.4 F (37.4 C)  Resp:  34    Filed Weights   03/26/16 0457 03/27/16 0434 03/28/16 0449  Weight: 69.763 kg (153 lb 12.8 oz) 68.947 kg (152 lb) 68.04 kg (150 lb)    ROS: Review of Systems  Constitutional: Negative for fever and chills.  Eyes: Negative for blurred vision.  Respiratory: Negative for cough and shortness of breath.   Cardiovascular: Negative for chest pain.  Gastrointestinal: Positive for diarrhea. Negative for nausea, vomiting, abdominal pain and constipation.  Genitourinary: Negative for dysuria.  Musculoskeletal: Negative for joint pain.  Neurological: Negative for dizziness and headaches.   Exam: Physical Exam  HENT:  Nose: No mucosal edema.  Mouth/Throat: No oropharyngeal exudate or posterior oropharyngeal edema.  Eyes: Conjunctivae and lids are normal. Pupils are equal, round, and reactive to light.  Neck: No JVD present. Carotid bruit is not present. No edema present. No thyroid mass and no thyromegaly present.  Cardiovascular: S1 normal and S2 normal.  Exam reveals no gallop.   No murmur heard. Respiratory: No accessory muscle usage. No respiratory distress. She has no wheezes. She has no rhonchi. She has no rales.  GI: Soft. Bowel sounds are normal. There is no tenderness.  Musculoskeletal:       Right ankle: She exhibits no swelling.       Left ankle: She exhibits no swelling.  Lymphadenopathy:    She has no cervical adenopathy.  Neurological: She is alert.  Positive tremor  Skin: Skin is warm. No  rash noted. Nails show no clubbing.  Psychiatric: She has a normal mood and affect.    Data Reviewed: Basic Metabolic Panel:  Recent Labs Lab 03/22/16 0338 03/24/16 0456 03/26/16 0513 03/28/16 0609  NA 145 137 131* 127*  K 4.3 4.2 4.4 5.2*  CL 108 91* 87* 84*  CO2 36* 43* 41* 37*  GLUCOSE 171* 165* 157* 178*  BUN 29* 17 14 15   CREATININE 0.67 0.59 0.57 0.50  CALCIUM 8.2* 8.1* 8.1* 8.2*  MG 2.4  --  1.7 1.9   CBC:  Recent Labs Lab 03/22/16 0338 03/24/16 0456 03/25/16 0417 03/26/16 0513 03/28/16 0609  WBC 26.7* 23.2* 36.1* 28.1* 49.7*  HGB 11.7* 10.9* 10.1* 10.4* 9.9*  HCT 36.2 33.7* 30.8* 31.7* 30.4*  MCV 85.7 84.2 83.2 85.2 83.3  PLT 248 188 190 207 342    CBG:  Recent Labs Lab 03/27/16 1638 03/27/16 2029 03/27/16 2342 03/28/16 0345 03/28/16 0743  GLUCAP 157* 195* 155* 178* 203*    Recent Results (from the past 240 hour(s))  Culture, blood (Routine X 2)     Status: None   Collection Time: 03/20/16  6:35 AM  Result Value Ref Range Status   Specimen Description BLOOD RIGHT FOREARM  Final   Special Requests   Final    BOTTLES DRAWN AEROBIC AND ANAEROBIC AER 8ML ANA 2ML   Culture NO GROWTH 5 DAYS  Final   Report Status 03/25/2016 FINAL  Final  Culture, blood (Routine X 2)     Status: None  Collection Time: 03/20/16  6:38 AM  Result Value Ref Range Status   Specimen Description BLOOD LEFT WRIST  Final   Special Requests   Final    BOTTLES DRAWN AEROBIC AND ANAEROBIC AER 6ML ANA 2ML   Culture NO GROWTH 5 DAYS  Final   Report Status 03/25/2016 FINAL  Final  Urine culture     Status: None   Collection Time: 03/20/16  8:40 AM  Result Value Ref Range Status   Specimen Description URINE, RANDOM  Final   Special Requests NONE  Final   Culture NO GROWTH [facility]   Final   Report Status 03/21/2016 FINAL  Final  MRSA PCR Screening     Status: None   Collection Time: 03/20/16 10:55 AM  Result Value Ref Range Status   MRSA by PCR  NEGATIVE NEGATIVE Final    Comment:        The GeneXpert MRSA Assay (FDA approved for NASAL specimens only), is one component of a comprehensive MRSA colonization surveillance program. It is not intended to diagnose MRSA infection nor to guide or monitor treatment for MRSA infections.      Scheduled Meds: . antiseptic oral rinse  7 mL Mouth Rinse q12n4p  . budesonide (PULMICORT) nebulizer solution  0.25 mg Nebulization BID  . chlorhexidine  15 mL Mouth Rinse BID  . clonazePAM  0.5 mg Oral BID  . enoxaparin (LOVENOX) injection  40 mg Subcutaneous Q24H  . free water  200 mL Per Tube Q8H  . insulin aspart  0-15 Units Subcutaneous Q4H  . insulin glargine  7 Units Subcutaneous QHS  . ipratropium-albuterol  3 mL Inhalation TID  . levETIRAcetam  500 mg Oral BID  . levothyroxine  25 mcg Oral QAC breakfast  . metoprolol  50 mg Oral BID  . multivitamin with minerals  1 tablet Oral Daily  . predniSONE  5 mg Per Tube Q breakfast  . rosuvastatin  20 mg Oral Q24H  . saccharomyces boulardii  250 mg Oral BID  . sodium chloride flush  3 mL Intravenous Q12H  . vancomycin  500 mg Oral Q6H   Continuous Infusions: . feeding supplement (GLUCERNA 1.2 CAL) 1,000 mL (03/28/16 0431)    Assessment/Plan:  1. Sepsis secondary to C. difficile colitis: she is hypotensive, tachycardic, tachypneic today. Mental status improved. White blood cell count still elevated (28->49). Patient has a rectal tube in. Rectal tube will need to come out prior to disposition. Continue oral vancomycin but consider alternate choice (difficid) if family and patient wants to be agressive, d/w her sister - they're leaning towards Hospice Home, increase IVFs @ 150 cc 2. C.Diff diarrhea: PO vanco, can consider difficide if family chooses agrresive care. 3. Acute on chronic respiratory failure and COPD exacerbation. Continue oxygen supplementation. Continue nebulizers and budesonide. Patient on low-dose prednisone. 4. Type 2  diabetes mellitus. Patient is on low-dose Lantus and sliding scale 5. Seizure disorder on Keppra 6. Hypothyroidism unspecified on levothyroxine 7. Essential hypertension and tachycardia on metoprolol 8. Hyperlipidemia unspecified on Crestor 9. Hyponatremia on free water via PEG tube feeding: increase IVF 10. Appreciate palliative care consultation. Patient is a DO NOT RESUSCITATE, family interested (d/w her sister) - they're leaning towards Hospice Home  Code Status: DNR Family Communication: Spoke with family/sister on phone, they're planning to be here @ 1 pm.  Disposition Plan: Hospice Home depending on meeting with family at 1 pm by Hospice Liasion  Antibiotics:  Oral vancomycin  Time (Critical  Care) spent: 35 minutes She is at high risk for multi-organ failure and death. Recommend Hospice Home if family is agreeable.  [facility], Cataleya Cristina  [employer]

## 2016-03-28 NOTE — Progress Notes (Signed)
Nutrition Follow-up  DOCUMENTATION CODES:   Not applicable  INTERVENTION:   Spoke with Dr. Sherryll BurgerShah this am regarding nutrition poc. Per Dr. Sherryll BurgerShah can continue current TF/free water regimen with initiation of IVF (NS at 17250mL/hr). Glucerna 1.2 continues at 7760mL/hr with free water of 200mL TID providing 1728kcals and 1766mL of free water daily. Will continue to follow poc, electrolytes, abdominal profile, and make recommendations accordingly.   NUTRITION DIAGNOSIS:   Inadequate oral intake related to chronic illness as evidenced by NPO status, being addressed with TF  GOAL:   Patient will meet greater than or equal to 90% of their needs; ongoing  MONITOR:   TF tolerance, Weight trends, Labs  REASON FOR ASSESSMENT:   Consult Enteral/tube feeding initiation and management  ASSESSMENT:   65 yo female admitted with possible sepsis, possible pneumonia, diarrhea with C.diff pending, hyperglycemia with FSBS >500.  Pt with hx of subarachnoid hemorrhage requiring craniotomy, pt with residual brain injury. Pt has PEG tube presently, hx of trach which was reversed  Per MD note, pt possibly becoming septic. Palliative care following. Per MD note, considering Hospice home. GI consulted.   Diet Order:  Diet full liquid Room service appropriate?: Yes; Fluid consistency:: Thin    Current Nutrition: Pt tolerating Glucerna 1.2 at 4760mL/hr with free water 200mL TID   Gastrointestinal Profile: Last BM:  03/28/2016 c.diff positive   Medications: MVI, Prednisone, SS novolog, Lantus, NS at 15450mL/hr Labs: Potassium 5.2, Sodium 127,  Magnesium 1.9    Weight Trend since Admission: Filed Weights   03/26/16 0457 03/27/16 0434 03/28/16 0449  Weight: 153 lb 12.8 oz (69.763 kg) 152 lb (68.947 kg) 150 lb (68.04 kg)     Skin:  Reviewed, no issues   BMI:  Body mass index is 29.3 kg/(m^2).  Estimated Nutritional Needs:   Kcal:  1650-1980 kcals  Protein:  79-99 g  Fluid:  >/= 1.6  L  EDUCATION NEEDS:   No education needs identified at this time  Leda QuailAllyson Odie Edmonds, RD, LDN Pager [phone] Weekend/On-Call Pager [phone]

## 2016-03-28 NOTE — Consult Note (Signed)
Patient with multiple medical problems and is a Hospice patient with prognosis measured in weeks.  No new recommendations, continue Dificid.  No new suggestions.

## 2016-03-28 NOTE — Clinical Social Work Note (Signed)
[facility]. CSW confirmed with facility that pt will return with Hospice and will have a rectal tube. Hospital Liaison is aware. Pt's family is aware and agreeable to discharge plan. CSW also updated MD. CSW will continue to follow.   Dede QuerySarah Orpha Dain, MSW, LCSW  Clinical Social Worker  [phone]

## 2016-03-29 DIAGNOSIS — R627 Adult failure to thrive: Secondary | ICD-10-CM

## 2016-03-29 LAB — BASIC METABOLIC PANEL
ANION GAP: 3 — AB (ref 5–15)
BUN: 11 mg/dL (ref 6–20)
CALCIUM: 7.6 mg/dL — AB (ref 8.9–10.3)
CHLORIDE: 90 mmol/L — AB (ref 101–111)
CO2: 35 mmol/L — AB (ref 22–32)
Creatinine, Ser: 0.46 mg/dL (ref 0.44–1.00)
GFR calc Af Amer: >60 mL/min (ref >60)
GFR calc non Af Amer: >60 mL/min (ref >60)
GLUCOSE: 174 mg/dL — AB (ref 65–99)
POTASSIUM: 4.6 mmol/L (ref 3.5–5.1)
Sodium: 128 mmol/L — ABNORMAL LOW (ref 135–145)

## 2016-03-29 LAB — GLUCOSE, CAPILLARY
GLUCOSE-CAPILLARY: 162 mg/dL — AB (ref 65–99)
GLUCOSE-CAPILLARY: 154 mg/dL — AB (ref 65–99)
GLUCOSE-CAPILLARY: 174 mg/dL — AB (ref 65–99)
Glucose-Capillary: 171 mg/dL — ABNORMAL HIGH (ref 65–99)

## 2016-03-29 LAB — CBC
HEMATOCRIT: 26.8 % — AB (ref 35.0–47.0)
HEMOGLOBIN: 8.8 g/dL — AB (ref 12.0–16.0)
MCH: 27.8 pg (ref 26.0–34.0)
MCHC: 32.9 g/dL (ref 32.0–36.0)
MCV: 84.3 fL (ref 80.0–100.0)
Platelets: 314 10*3/uL (ref 150–440)
RBC: 3.17 MIL/uL — ABNORMAL LOW (ref 3.80–5.20)
RDW: 14.5 % (ref 11.5–14.5)
WBC: 32.3 10*3/uL — AB (ref 3.6–11.0)

## 2016-03-29 MED ORDER — FREE WATER: 100.0000 mL | Freq: Three times a day (TID) | Status: DC

## 2016-03-29 MED ORDER — MORPHINE SULFATE (CONCENTRATE) 10 MG/0.5ML PO SOLN
5.0000 mg | ORAL | Status: DC | PRN
  Administered 2016-03-29: 11:00:00 5 mg via ORAL
  Filled 2016-03-29: qty 1

## 2016-03-29 MED ORDER — CETYLPYRIDINIUM CHLORIDE 0.05 % MT LIQD: 7.0000 mL | Freq: Two times a day (BID) | OROMUCOSAL | Status: DC

## 2016-03-29 MED ORDER — MORPHINE SULFATE (CONCENTRATE) 10 MG/0.5ML PO SOLN: 5.0000 mg | ORAL | Status: AC | PRN

## 2016-03-29 MED ORDER — GLUCERNA 1.2 CAL PO LIQD
1000.0000 mL | ORAL | Status: DC
Start: 2016-03-29 — End: 2016-03-29

## 2016-03-29 MED ORDER — FREE WATER: 100.0000 mL | Freq: Three times a day (TID) | Status: AC

## 2016-03-29 MED ORDER — BUDESONIDE 0.25 MG/2ML IN SUSP: 0.2500 mg | Freq: Two times a day (BID) | RESPIRATORY_TRACT | Status: DC

## 2016-03-29 MED ORDER — ONDANSETRON HCL 4 MG PO TABS: 4.0000 mg | ORAL_TABLET | Freq: Four times a day (QID) | ORAL | Status: AC | PRN

## 2016-03-29 MED ORDER — FIDAXOMICIN 200 MG PO TABS: 200.0000 mg | ORAL_TABLET | Freq: Two times a day (BID) | ORAL | Status: AC

## 2016-03-29 MED ORDER — CLONAZEPAM 0.5 MG PO TABS: 0.5000 mg | ORAL_TABLET | Freq: Two times a day (BID) | ORAL | Status: AC

## 2016-03-29 MED ORDER — GLUCERNA 1.2 CAL PO LIQD: 1000.0000 mL | ORAL | Status: AC

## 2016-03-29 MED ORDER — CHLORHEXIDINE GLUCONATE 0.12 % MT SOLN: 15.0000 mL | Freq: Two times a day (BID) | OROMUCOSAL | Status: DC

## 2016-03-29 MED ORDER — LORAZEPAM 1 MG PO TABS: 1.0000 mg | ORAL_TABLET | ORAL | Status: AC | PRN

## 2016-03-29 MED ORDER — LORAZEPAM 1 MG PO TABS: 1.0000 mg | ORAL_TABLET | ORAL | Status: DC | PRN

## 2016-03-29 NOTE — Discharge Instructions (Signed)
Tube feeding. Hospice care.

## 2016-03-29 NOTE — Care Management Important Message (Signed)
Important Message  Patient Details  Name: Stephanie SilvasVicky D Lorenzi MRN: [id number] Date of Birth: 1951-07-17   Medicare Important Message Given:  Yes    Gwenette GreetBrenda S Tymon Nemetz, RN 03/29/2016, 1:14 PM

## 2016-03-29 NOTE — Discharge Summary (Signed)
Stephanie Doyle - [place] at [facility]   PATIENT NAME: Stephanie SimpsonVicky Doyle    MR#:  [id number]  DATE OF BIRTH:  07-Dec-1950  DATE OF ADMISSION:  03/20/2016 ADMITTING PHYSICIAN: Delfino LovettVipul Shah, MD  DATE OF DISCHARGE: 03/29/2016 PRIMARY CARE PHYSICIAN: [name], MD    ADMISSION DIAGNOSIS:  HCAP (healthcare-associated pneumonia) [J18.9] Sepsis, due to unspecified organism (HCC) [A41.9]   DISCHARGE DIAGNOSIS:  Sepsis secondary to C. difficile colitis C.Diff diarrhea Acute on chronic respiratory failure and COPD exacerbation SECONDARY DIAGNOSIS:   Past Medical History  Diagnosis Date  . COPD (chronic obstructive pulmonary disease) (HCC)     HOSPITAL COURSE:   1. Sepsis secondary to C. difficile colitis: she is hypotensive, tachycardic, tachypneic today. Mental status improved. White blood cell count still elevated (28->49). Patient has a rectal tube in. Rectal tube will need to come out prior to disposition. Continue oral vancomycin but consider alternate choice (difficid) if family and patient wants to be agressive, d/w her sister - they're leaning towards Hospice Home, increase IVFs @ 150 cc 2. C.Diff diarrhea: PO vanco, can consider difficide if family chooses agrresive care. 3. Acute on chronic respiratory failure and COPD exacerbation. Continue oxygen supplementation. Continue nebulizers and budesonide. Patient on low-dose prednisone. 4. Type 2 diabetes mellitus. Patient is on low-dose Lantus and sliding scale 5. Seizure disorder on Keppra 6. Hypothyroidism unspecified on levothyroxine 7. Essential hypertension and tachycardia on metoprolol 8. Hyperlipidemia unspecified on Crestor 9. Hyponatremia on free water via PEG tube feeding. 10. Per palliative care consultation. Patient will be discharged to SNF with Hospice care.  DISCHARGE CONDITIONS:   Poor prognosis, Patient will be discharged to SNF with Hospice care today.  CONSULTS OBTAINED:  Treatment  Team:  Scot Junobert T Elliott, MD  DRUG ALLERGIES:   Allergies  Allergen Reactions  . Augmentin [Amoxicillin-Pot Clavulanate] Other (See Comments)    Reaction: unknown  . Vancomycin     Pt had red rash all over body after vanc was given, this was told to me in report but had not been added to the allergy list     DISCHARGE MEDICATIONS:   Current Discharge Medication List    START taking these medications   Details  fidaxomicin (DIFICID) 200 MG TABS tablet Take 1 tablet (200 mg total) by mouth 2 (two) times daily. Qty: 20 tablet    LORazepam (ATIVAN) 1 MG tablet Take 1 tablet (1 mg total) by mouth every 4 (four) hours as needed for anxiety. Qty: 16 tablet, Refills: 0    ondansetron (ZOFRAN) 4 MG tablet Take 1 tablet (4 mg total) by mouth every 6 (six) hours as needed for nausea. Qty: 20 tablet, Refills: 0    Water For Irrigation, Sterile (FREE WATER) SOLN Place 100 mLs into feeding tube every 8 (eight) hours.      CONTINUE these medications which have CHANGED   Details  clonazePAM (KLONOPIN) 0.5 MG tablet Take 1 tablet (0.5 mg total) by mouth 2 (two) times daily. Qty: 30 tablet, Refills: 0    Morphine Sulfate (MORPHINE CONCENTRATE) 10 MG/0.5ML SOLN concentrated solution Take 0.25 mLs (5 mg total) by mouth every 2 (two) hours as needed for moderate pain, severe pain or shortness of breath. Qty: 180 mL, Refills: 0    Nutritional Supplements (FEEDING SUPPLEMENT, GLUCERNA 1.2 CAL,) LIQD Place 1,000 mLs into feeding tube continuous.      CONTINUE these medications which have NOT CHANGED   Details  acetaminophen (TYLENOL) 325 MG tablet Take 650 mg by  mouth every 4 (four) hours as needed.    levETIRAcetam (KEPPRA) 500 MG tablet Take 500 mg by mouth 2 (two) times daily.    levothyroxine (SYNTHROID, LEVOTHROID) 25 MCG tablet Take 25 mcg by mouth daily before breakfast.    metoprolol (LOPRESSOR) 50 MG tablet Take 50 mg by mouth 2 (two) times daily.    predniSONE (DELTASONE) 5 MG  tablet Take 5 mg by mouth daily with breakfast.      STOP taking these medications     albuterol-ipratropium (COMBIVENT) 18-103 MCG/ACT inhaler      antiseptic oral rinse (BIOTENE) LIQD      diphenhydrAMINE (BENADRYL) 25 mg capsule      famotidine (PEPCID) 20 MG tablet      hydrocortisone cream 1 %      ipratropium-albuterol (DUONEB) 0.5-2.5 (3) MG/3ML SOLN      levalbuterol (XOPENEX) 1.25 MG/3ML nebulizer solution      levalbuterol (XOPENEX) 1.25 MG/3ML nebulizer solution      Linezolid in Sodium Chloride 600-0.9 MG/300ML-% SOLN      metroNIDAZOLE (FLAGYL) 500 MG tablet      Multiple Vitamin (MULTIVITAMIN WITH MINERALS) TABS tablet      rosuvastatin (CRESTOR) 20 MG tablet      saccharomyces boulardii (FLORASTOR) 250 MG capsule      sodium chloride 0.9 % infusion      zinc oxide (BALMEX) 11.3 % CREA cream      zinc oxide 20 % ointment          DISCHARGE INSTRUCTIONS:    If you experience worsening of your admission symptoms, develop shortness of breath, life threatening emergency, suicidal or homicidal thoughts you must seek medical attention immediately by calling 911 or calling your MD immediately  if symptoms less severe.  You Must read complete instructions/literature along with all the possible adverse reactions/side effects for all the Medicines you take and that have been prescribed to you. Take any new Medicines after you have completely understood and accept all the possible adverse reactions/side effects.   Please note  You were cared for by a hospitalist during your hospital stay. If you have any questions about your discharge medications or the care you received while you were in the hospital after you are discharged, you can call the unit and asked to speak with the hospitalist on call if the hospitalist that took care of you is not available. Once you are discharged, your primary care physician will handle any further medical issues. Please note that NO  REFILLS for any discharge medications will be authorized once you are discharged, as it is imperative that you return to your primary care physician (or establish a relationship with a primary care physician if you do not have one) for your aftercare needs so that they can reassess your need for medications and monitor your lab values.    Today   SUBJECTIVE    Abdominal pain and diarrhea. On O2 [place] 2L.  VITAL SIGNS:  Blood pressure 152/94, pulse 108, temperature 98.4 F (36.9 C), temperature source Oral, resp. rate 26, height 5' (1.524 m), weight 159 lb 1.6 oz (72.167 kg), SpO2 100 %.  I/O:   Intake/Output Summary (Last 24 hours) at 03/29/16 1103 Last data filed at 03/29/16 0457  Gross per 24 hour  Intake   3487 ml  Output    420 ml  Net   3067 ml    PHYSICAL EXAMINATION:  GENERAL:  65 y.o.-year-old patient lying in the bed with no  acute distress. Obese. EYES: Pupils equal, round, reactive to light and accommodation. No scleral icterus. Extraocular muscles intact.  HEENT: Head atraumatic, normocephalic. Oropharynx and nasopharynx clear.  NECK:  Supple, no jugular venous distention. No thyroid enlargement, no tenderness.  LUNGS: Normal breath sounds bilaterally, mild wheezing, no rales,rhonchi or crepitation. No use of accessory muscles of respiration.  CARDIOVASCULAR: S1, S2 normal. No murmurs, rubs, or gallops.  ABDOMEN: Soft, non-tender, non-distended. Bowel sounds present. No organomegaly or mass.  EXTREMITIES: No pedal edema, cyanosis, or clubbing.  NEUROLOGIC: Cranial nerves II through XII are intact. Muscle strength 3-4/5 in all extremities. Sensation intact. Gait not checked.  PSYCHIATRIC: The patient is alert and oriented x 2.  SKIN: No obvious rash, lesion, or ulcer.   DATA REVIEW:   CBC  Recent Labs Lab 03/29/16 0527  WBC 32.3*  HGB 8.8*  HCT 26.8*  PLT 314    Chemistries   Recent Labs Lab 03/28/16 0609 03/29/16 0527  NA 127* 128*  K 5.2* 4.6  CL  84* 90*  CO2 37* 35*  GLUCOSE 178* 174*  BUN 15 11  CREATININE 0.50 0.46  CALCIUM 8.2* 7.6*  MG 1.9  --     Cardiac Enzymes No results for input(s): TROPONINI in the last 168 hours.  Microbiology Results  Results for orders placed or performed during the hospital encounter of 03/20/16  Culture, blood (Routine X 2)     Status: None   Collection Time: 03/20/16  6:35 AM  Result Value Ref Range Status   Specimen Description BLOOD RIGHT FOREARM  Final   Special Requests   Final    BOTTLES DRAWN AEROBIC AND ANAEROBIC AER ANA   Culture NO GROWTH 5 DAYS  Final   Report Status 03/25/2016 FINAL  Final  Culture, blood (Routine X 2)     Status: None   Collection Time: 03/20/16  6:38 AM  Result Value Ref Range Status   Specimen Description BLOOD LEFT WRIST  Final   Special Requests   Final    BOTTLES DRAWN AEROBIC AND ANAEROBIC AER ANA   Culture NO GROWTH 5 DAYS  Final   Report Status 03/25/2016 FINAL  Final  Urine culture     Status: None   Collection Time: 03/20/16  8:40 AM  Result Value Ref Range Status   Specimen Description URINE, RANDOM  Final   Special Requests NONE  Final   Culture NO GROWTH Performed at [facility]   Final   Report Status 03/21/2016 FINAL  Final  MRSA PCR Screening     Status: None   Collection Time: 03/20/16 10:55 AM  Result Value Ref Range Status   MRSA by PCR NEGATIVE NEGATIVE Final    Comment:        The GeneXpert MRSA Assay (FDA approved for NASAL specimens only), is one component of a comprehensive MRSA colonization surveillance program. It is not intended to diagnose MRSA infection nor to guide or monitor treatment for MRSA infections.     RADIOLOGY:  No results found.      Management plans discussed with the patient, family and they are in agreement.  CODE STATUS:     Code Status Orders        Start     Ordered   03/20/16 1043  Do not attempt resuscitation (DNR)   Continuous    Question Answer  Comment  In the event of cardiac or respiratory ARREST Do not call a "code blue"  In the event of cardiac or respiratory ARREST Do not perform Intubation, CPR, defibrillation or ACLS   In the event of cardiac or respiratory ARREST Use medication by any route, position, wound care, and other measures to relive pain and suffering. May use oxygen, suction and manual treatment of airway obstruction as needed for comfort.      03/20/16 1042    Code Status History    Date Active Date Inactive Code Status Order ID Comments User Context   03/20/2016 10:29 AM 03/20/2016 10:42 AM Full Code [id number]  Delfino LovettVipul Shah, MD Inpatient   08/26/2014  5:12 PM 09/23/2014  5:41 PM Full Code [id number]  Leane Parapal Moore, CNA Inpatient   07/26/2014  7:55 PM 08/26/2014  5:12 PM Full Code [id number]  Coletta MemosKyle Cabbell, MD Inpatient      TOTAL TIME TAKING CARE OF THIS PATIENT: 46 minutes.    Shaune Pollackhen, Osamah Schmader M.D on 03/29/2016 at 11:03 AM  Between 7am to 6pm - Pager - [phone]  After 6pm go to [email or web address] - password [email or web address] [facility]  [facility]  Office  [phone]  CC: Primary care physician; [name], MD

## 2016-03-29 NOTE — Discharge Planning (Addendum)
Pt IV removed and Tube feeding reduced per orders.  Tube feeing to be stopped, rectal and 2L O2 left in place for DC to facility, per orders.  [facility], s/w [place] Coates, LPN.  Scripts printed and placed in packet. 4 rectal bags also sent to allow time for hospice to provide. Rectal pouch changed prior to EMS arrival (500 output).  RN assessment and VS revealed stability for DC to facility (rm 302B). Family aslo aware of DC to facility.  EMS contacted to transport.

## 2016-03-29 NOTE — Clinical Social Work Note (Signed)
Pt is ready for discharge today and will return to facility. Pt's family is aware and agreeable to discharge plan. Facility is ready to admit pt as they have received discharge information. RN called report and EMS will provide transportation. CSW is signing off as no further needs identified.   Dede QuerySarah Wanza Szumski, MSW, LCSW  Clinical Social Worker  [phone]

## 2016-03-29 NOTE — Progress Notes (Signed)
Follow up visit to new referral for [facility] services at [facility]. Palliative Care NP Wadie Lessen met again with patient's sister Vaughan Basta and brother in [employer]. She confirmed their choice to have Ms. Epting return to [place] with hospice following. Her tube feeding will be decreased to 30 ml q/hr and medications have been adjusted to focus on her comfort. Writer spoke with Vaughan Basta and [facility], they have requested that the staff RN contact them at [phone] when EMS arrives to transport Stephanie Doyle. Discharge summary faxed to hospice referral intake. Signed portable DNR and MOST form in place in discharge packet. Hospital care team all aware of and in agreement with discharge plan. Thank you. Flo Shanks RN, BSN, [place] and [facility] [place], [facility] [phone] c

## 2016-03-29 NOTE — Progress Notes (Signed)
Daily Progress Note   Patient Name: Stephanie Doyle       Date: 03/29/2016 DOB: November 20, 1950  Age: 65 y.o. MRN#: [id number] Attending Physician: Shaune PollackQing Chen, MD Primary Care Physician: [name], MD Admit Date: 03/20/2016  Reason for Consultation/Follow-up: Establishing goals of care, Non pain symptom management, Pain control and Psychosocial/spiritual support  Subjective: - continued conversation  with patient's sister/HPOA / Stephanie QuinLinda at bedside regarding  diagnosis, prognosis, GOC, EOL wishes disposition and options.   - continued conversation clarifying GOC specific to  artifical feeding and hydration, continued IV antibiotics and rehospitalization was had. The difference between a aggressive medical intervention path and a palliative comfort care path for this patient at this time was had. Values and goals of care important to patient and family were attempted to be elicited.   -family is comfortable with decision of shifting to full comfort, transition back to SNF with hospice services in place  - Questions and concerns addressed   Length of Stay: 9  Current Medications: Scheduled Meds:  . antiseptic oral rinse  7 mL Mouth Rinse q12n4p  . budesonide (PULMICORT) nebulizer solution  0.25 mg Nebulization BID  . chlorhexidine  15 mL Mouth Rinse BID  . clonazePAM  0.5 mg Oral BID  . enoxaparin (LOVENOX) injection  40 mg Subcutaneous Q24H  . fidaxomicin  200 mg Oral BID  . free water  200 mL Per Tube Q8H  . insulin aspart  0-15 Units Subcutaneous Q4H  . insulin glargine  7 Units Subcutaneous QHS  . ipratropium-albuterol  3 mL Inhalation TID  . levETIRAcetam  500 mg Oral BID  . levothyroxine  25 mcg Oral QAC breakfast  . metoprolol  50 mg Oral BID  . multivitamin with minerals   1 tablet Oral Daily  . predniSONE  5 mg Per Tube Q breakfast  . rosuvastatin  20 mg Oral Q24H  . saccharomyces boulardii  250 mg Oral BID  . sodium chloride flush  3 mL Intravenous Q12H  . vancomycin  500 mg Oral Q6H    Continuous Infusions: . sodium chloride 150 mL/hr at 03/29/16 0010  . feeding supplement (GLUCERNA 1.2 CAL) 1,000 mL (03/29/16 0457)    PRN Meds: acetaminophen **OR** acetaminophen, HYDROcodone-acetaminophen, labetalol, levalbuterol, ondansetron **OR** ondansetron (ZOFRAN) IV, traZODone  Physical Exam  Constitutional: She appears well-developed.  She appears lethargic. She appears ill. Nasal cannula in place.  Cardiovascular: Tachycardia present.   Pulmonary/Chest: She has decreased breath sounds.  Neurological: She appears lethargic.  Skin: Skin is warm and dry.            Vital Signs: BP 162/90 mmHg  Pulse 114  Temp(Src) 98.4 F (36.9 C) (Oral)  Resp 20  Ht 5' (1.524 m)  Wt 72.167 kg (159 lb 1.6 oz)  BMI 31.07 kg/m2  SpO2 100% SpO2: SpO2: 100 % O2 Device: O2 Device: Nasal Cannula O2 Flow Rate: O2 Flow Rate (L/min): 2 L/min  Intake/output summary:  Intake/Output Summary (Last 24 hours) at 03/29/16 0837 Last data filed at 03/29/16 0457  Gross per 24 hour  Intake   3487 ml  Output    420 ml  Net   3067 ml   LBM: Last BM Date: 03/28/16 Baseline Weight: Weight: 47.628 kg (105 lb) Most recent weight: Weight: 72.167 kg (159 lb 1.6 oz)       Palliative Assessment/Data:    Flowsheet Rows        Most Recent Value   Intake Tab    Referral Department  Hospitalist   Unit at Time of Referral  ICU   Palliative Care Primary Diagnosis  Neurology   Date Notified  03/20/16   Palliative Care Type  New Palliative care   Reason for referral  Clarify Goals of Care   Date of Admission  03/20/16   # of days IP prior to Palliative referral  0   Clinical Assessment    Psychosocial & Spiritual Assessment    Palliative Care Outcomes       Patient Active  Problem List   Diagnosis Date Noted  . Palliative care encounter 03/21/2016  . DNR (do not resuscitate) 03/21/2016  . Sepsis (HCC) 03/20/2016  . Dyspnea   . Metabolic encephalopathy   . Clostridium difficile diarrhea   . Hyperglycemia   . Tracheostomy status (HCC)   . Hypernatremia 08/19/2014  . Acute respiratory failure with hypoxia (HCC)   . HCAP (healthcare-associated pneumonia)   . Acute on chronic respiratory failure (HCC) 08/11/2014  . Fungemia 08/11/2014  . Hypomagnesemia 08/01/2014  . Hypophosphatemia 08/01/2014  . Subarachnoid hemorrhage (HCC)   . Acute respiratory failure (HCC)   . Absolute anemia   . COPD, moderate (HCC) 05/18/2014    Palliative Care Assessment & Plan    Assessment: -continued physical and functional decline in spite of medical interventions, shift of care to comfort and dignity    Goals of Care and Additional Recommendations:  Limitations on Scope of Treatment: Full Comfort Care   - no further diagnostics, labs, CBGS -minimize medications -begin to wean artificial feedings/ decrease TF rate and free water by 50% -symptom management / Roxanol/ Ativan  Code Status:    Code Status Orders        Start     Ordered   03/20/16 1043  Do not attempt resuscitation (DNR)   Continuous    Question Answer Comment  In the event of cardiac or respiratory ARREST Do not call a "code blue"   In the event of cardiac or respiratory ARREST Do not perform Intubation, CPR, defibrillation or ACLS   In the event of cardiac or respiratory ARREST Use medication by any route, position, wound care, and other measures to relive pain and suffering. May use oxygen, suction and manual treatment of airway obstruction as needed for comfort.      03/20/16 1042  Code Status History    Date Active Date Inactive Code Status Order ID Comments User Context   03/20/2016 10:29 AM 03/20/2016 10:42 AM Full Code [id number]  Delfino Lovett, MD Inpatient   08/26/2014  5:12 PM  09/23/2014  5:41 PM Full Code [id number]  Leane Para, CNA Inpatient   07/26/2014  7:55 PM 08/26/2014  5:12 PM Full Code [id number]  Coletta Memos, MD Inpatient       Prognosis:   < 4 weeks  Discharge Planning:  Skilled Nursing Facility with Hospice  Care plan was discussed with Dr Imogene Burn, Dayna Barker RN Lamar Blinks, family  Thank you for allowing the Palliative Medicine Team to assist in the care of this patient.   Time In: 0915 Time Out: 1000 Total Time  45 min Prolonged Time Billed  no       Greater than 50%  of this time was spent counseling and coordinating care related to the above assessment and plan.  Lorinda Creed, NP  Please contact Palliative Medicine Team phone at [phone] for questions and concerns.

## 2016-03-31 ENCOUNTER — Non-Acute Institutional Stay (SKILLED_NURSING_FACILITY): Payer: Medicare Other | Admitting: Gerontology

## 2016-03-31 DIAGNOSIS — A419 Sepsis, unspecified organism: Secondary | ICD-10-CM

## 2016-03-31 NOTE — Progress Notes (Signed)
Location:      Place of Service:  SNF (31) Provider:  Lorenso QuarryShannon Terissa Haffey, NP-C  [name], MD  Patient Care Team: [name], MD as PCP - General (Pulmonary Disease)  Extended Emergency Contact Information Primary Emergency Contact: Lavell AnchorsShue,[name] W Address: [address]          [place], [place] [address] [place] Home Phone: [phone] Mobile Phone: [phone] Relation: Sister  Code Status:  DNR Goals of care: Advanced Directive information Advanced Directives 03/20/2016  Does patient have an advance directive? No  Would patient like information on creating an advanced directive? No - patient declined information     Chief Complaint  Patient presents with  . Hospitalization Follow-up    HPI:  Pt is a 65 y.o. female seen today for a hospital f/u s/p admission from Sepsis related to C-Diff. She was admitted to the hospital and failed to respond to therapies. After a week in the hospital with minimal improvement, Palliative Care consult was initiated. PC had multiple conversations with family regarding goals of therapies, pt/ family wishes for her care, etc. The decision was made to make her comfort care. All aggressive interventions were stopped, except for the tube feedings. Family was not yet ready to discontinue the feedings. She was readmitted to the facility yesterday with Hospice services and comfort measures in place. When i saw here, she was cheerful, smiling and said she was glad to be back here and that she missed everyone. She also wanted me to tell everyone that she missed them. Pt was confused about some details, but this is typical of her baseline cognition. She reports she is breathing better, no chest pain. However, she does say her stomach still hurts. She completed the coarse of therapy for C-Diff prior to discharge from hospital.  Past Medical History  Diagnosis Date  . COPD (chronic obstructive pulmonary disease) [facility](HCC)    Past  Surgical History  Procedure Laterality Date  . Craniotomy Right 07/27/2014    Procedure: CRANIOTOMY INTRACRANIAL ANEURYSM FOR Pterional Aneurysm;  Surgeon: Lisbeth RenshawNeelesh Nundkumar, MD;  Location: [facility] NEURO ORS;  Service: Neurosurgery;  Laterality: Right;  . Esophagogastroduodenoscopy (egd) with propofol N/A 08/05/2014    Procedure: ESOPHAGOGASTRODUODENOSCOPY (EGD) WITH PROPOFOL;  Surgeon: [name] Thompson, MD;  [facility];  Service: General;  Laterality: N/A;  . Peg placement N/A 08/05/2014    Procedure: PERCUTANEOUS ENDOSCOPIC GASTROSTOMY (PEG) PLACEMENT;  Surgeon: [name] Thompson, MD;  [facility];  Service: General;  Laterality: N/A;  bedside/tech only    Allergies  Allergen Reactions  . Augmentin [Amoxicillin-Pot Clavulanate] Other (See Comments)    Reaction: unknown  . Vancomycin     Pt had red rash all over body after vanc was given, this was told to me in report but had not been added to the allergy list       Medication List       This list is accurate as of: 03/31/16 12:29 AM.  Always use your most recent med list.               acetaminophen 325 MG tablet  Commonly known as:  TYLENOL  Take 650 mg by mouth every 4 (four) hours as needed.     clonazePAM 0.5 MG tablet  Commonly known as:  KLONOPIN  Take 1 tablet (0.5 mg total) by mouth 2 (two) times daily.     feeding supplement (GLUCERNA 1.2 CAL) Liqd  Place 1,000 mLs into feeding tube continuous.  fidaxomicin 200 MG Tabs tablet  Commonly known as:  DIFICID  Take 1 tablet (200 mg total) by mouth 2 (two) times daily.     free water Soln  Place 100 mLs into feeding tube every 8 (eight) hours.     levETIRAcetam 500 MG tablet  Commonly known as:  KEPPRA  Take 500 mg by mouth 2 (two) times daily.     levothyroxine 25 MCG tablet  Commonly known as:  SYNTHROID, LEVOTHROID  Take 25 mcg by mouth daily before breakfast.     LORazepam 1 MG tablet  Commonly known as:  ATIVAN  Take 1 tablet (1 mg total)  by mouth every 4 (four) hours as needed for anxiety.     metoprolol 50 MG tablet  Commonly known as:  LOPRESSOR  Take 50 mg by mouth 2 (two) times daily.     morphine CONCENTRATE 10 MG/0.5ML Soln concentrated solution  Take 0.25 mLs (5 mg total) by mouth every 2 (two) hours as needed for moderate pain, severe pain or shortness of breath.     ondansetron 4 MG tablet  Commonly known as:  ZOFRAN  Take 1 tablet (4 mg total) by mouth every 6 (six) hours as needed for nausea.     predniSONE 5 MG tablet  Commonly known as:  DELTASONE  Take 5 mg by mouth daily with breakfast.        Review of Systems  Unable to perform ROS: Other (Confusion- above baseline)  Constitutional: Positive for diaphoresis, activity change and appetite change.  HENT: Negative.   Respiratory: Positive for chest tightness and shortness of breath.   Cardiovascular: Negative.   Gastrointestinal: Positive for abdominal pain, diarrhea and abdominal distention.  Genitourinary: Negative.   Musculoskeletal: Negative.   Skin: Positive for pallor.  Neurological: Negative.   Psychiatric/Behavioral: Positive for agitation.  All other systems reviewed and are negative.   Immunization History  Administered Date(s) Administered  . Pneumococcal Polysaccharide-23 07/30/2014   Pertinent  Health Maintenance Due  Topic Date Due  . MAMMOGRAM  11/04/1998  . COLONOSCOPY  11/04/1998  . DEXA SCAN  11/04/2013  . PNA vac Low Risk Adult (2 of 2 - PCV13) 07/31/2015  . INFLUENZA VACCINE  04/24/2016   No flowsheet data found. Functional Status Survey:    There were no vitals filed for this visit. There is no weight on file to calculate BMI. Physical Exam  Constitutional: She appears well-developed and well-nourished. She is cooperative. She has a sickly appearance. Nasal cannula in place.  Eyes: Conjunctivae, EOM and lids are normal. Pupils are equal, round, and reactive to light.  Cardiovascular: Normal pulses.  An  irregular rhythm present. Tachycardia present.  Exam reveals friction rub. Exam reveals no gallop and no distant heart sounds.   No murmur heard. Pulmonary/Chest: Accessory muscle usage present. Tachypnea noted. She is in respiratory distress. She has decreased breath sounds in the right middle field, the right lower field and the left lower field. She has wheezes in the right upper field and the left upper field. She has rhonchi in the right upper field and the left upper field.  Abdominal: Bowel sounds are normal. She exhibits distension. There is tenderness in the right upper quadrant, right lower quadrant, left upper quadrant and left lower quadrant.  Neurological: She is alert. She is not disoriented.  Skin: Skin is warm and intact. She is diaphoretic. No cyanosis. There is pallor. Nails show no clubbing.  Nursing note and vitals reviewed.   Labs  reviewed:  Recent Labs  03/22/16 0338  03/26/16 0513 03/28/16 0609 03/29/16 0527  NA 145  < > 131* 127* 128*  K 4.3  < > 4.4 5.2* 4.6  CL 108  < > 87* 84* 90*  CO2 36*  < > 41* 37* 35*  GLUCOSE 171*  < > 157* 178* 174*  BUN 29*  < > 14 15 11   CREATININE 0.67  < > 0.57 0.50 0.46  CALCIUM 8.2*  < > 8.1* 8.2* 7.6*  MG 2.4  --  1.7 1.9  --   < > = values in this interval not displayed.  Recent Labs  03/13/16 1228 03/20/16 0634 03/20/16 1112  AST 23 30 24   ALT 52 35 31  ALKPHOS 57 131* 116  BILITOT 0.2* <0.1* 0.2*  PROT 5.9* 5.0* 5.0*  ALBUMIN 2.7* 2.2* 2.1*    Recent Labs  03/17/16 0910 03/20/16 0634 03/20/16 1112  03/26/16 0513 03/28/16 0609 03/29/16 0527  WBC 16.8* 55.3* 56.3*  < > 28.1* 49.7* 32.3*  NEUTROABS 12.9* 52.5* 52.9*  --   --   --   --   HGB 10.6* 11.2* 11.0*  < > 10.4* 9.9* 8.8*  HCT 31.9* 35.1 34.2*  < > 31.7* 30.4* 26.8*  MCV 84.1 86.3 85.8  < > 85.2 83.3 84.3  PLT 241 308 306  < > 207 342 314  < > = values in this interval not displayed. Lab Results  Component Value Date   TSH 2.440 06/23/2015    Lab Results  Component Value Date   HGBA1C 7.5* 03/20/2016   Lab Results  Component Value Date   CHOL 165 06/23/2015   HDL 64 06/23/2015   LDLCALC 85 06/23/2015   TRIG 81 06/23/2015   CHOLHDL 2.6 06/23/2015    Significant Diagnostic Results in last 30 days:  Ct Angio Chest Pe W/cm &/or Wo Cm  03/20/2016  CLINICAL DATA:  Tachycardia, tachypnea, and dyspnea. EXAM: CT ANGIOGRAPHY CHEST WITH CONTRAST TECHNIQUE: Multidetector CT imaging of the chest was performed using the standard protocol during bolus administration of intravenous contrast. Multiplanar CT image reconstructions and MIPs were obtained to evaluate the vascular anatomy. CONTRAST:  75 cc Isovue 370 intravenous COMPARISON:  05/10/2014 FINDINGS: Cardiovascular: Normal heart size. No pericardial effusion. Atherosclerosis, including along the LAD. When allowing for intermittent motion artifact there is no indication of acute pulmonary embolism. No acute aortic finding. Mediastinum:  Negative for adenopathy. Lungs/Pleura: Extensive airway thickening with segmental narrowing or collapse. There is chronic collapse of the right middle lobe. Heterogeneous density of the apical lungs likely from air trapping, similar pattern seen on comparison exam. No suspicious nodularity when compared to prior; small clusters of pulmonary nodules are stable to decreased. Trace pleural effusions. Upper abdomen: No acute findings. Musculoskeletal: Mild body wall edema, symmetric. No acute osseous finding. Review of the MIP images confirms the above findings. IMPRESSION: 1. Advanced bronchitis with diffuse airway narrowing and chronic right middle lobe collapse. No consolidating pneumonia. 2. Motion degraded study without evidence of pulmonary embolism. 3. Trace pleural effusions. Electronically Signed   By: Marnee Spring M.D.   On: 03/20/2016 09:34   Dg Chest Port 1 View  03/20/2016  CLINICAL DATA:  Tachypnea, tachycardia, sepsis. EXAM: PORTABLE CHEST 1 VIEW  COMPARISON:  CT and plain film 03/20/2016 FINDINGS: Medial right infrahilar airspace disease again noted seen on prior plain films and CT compatible with right middle lobe collapse. Bibasilar atelectasis. Heart is normal size. No effusions or acute  bony abnormality. No change since prior study. IMPRESSION: Continued right middle lobe collapse.  Bibasilar atelectasis. No change since prior study. Electronically Signed   By: Charlett Nose M.D.   On: 03/20/2016 11:02   Dg Chest Port 1 View  03/20/2016  CLINICAL DATA:  Tachypnea and tachycardia EXAM: PORTABLE CHEST 1 VIEW COMPARISON:  September 02, 2014 FINDINGS: Tracheostomy catheter no longer present. There is airspace consolidation with volume loss in the right middle lobe adjacent to the right heart border. Lungs elsewhere clear. Heart size and pulmonary vascularity are normal. No adenopathy. There is calcification in the aortic arch region. IMPRESSION: Right middle lobe airspace opacity, likely pneumonia. Lungs elsewhere clear. Cardiac silhouette within normal limits. Aortic atherosclerosis. Electronically Signed   By: Bretta Bang III M.D.   On: 03/20/2016 07:19    Assessment/Plan 1. Sepsis, due to unspecified organism [facility])  Hospice referral  Morphine 20 mg/ ml-0.25-035 ml po Q 1 hour prn- pain, dyspnea  Robinul 0.2 mg/ ml- 0.1 mg sq q 4 hours prn severe secretions  Atropine 1 % opthalmic drops- 2 drops SL Q 30 minutes prn secretions  Scopolamine patch 1 patch behind ear Q 3 day  Foley for comfort  Lasix 40 mg IM x 1- congestion  O2 [place]- titrate for comfort  Tylenol 650 mg PR prn- fever  Glucerna 1.2- continuous tube feeding per PEG @ 30 ml/ hr  Contact precautions for C-Diff  Rectal tube in place to maintain skin integrity r/t diarrhea   Family/ staff Communication:   Total Time: 25 minutes  Documentation: 15 minutes  Face to Face: 10 minutes  Family/Phone:   Labs/tests ordered:  none  Brynda Rim,  NP-C Geriatrics [facility] [address] [place], [place] [address] Cell Phone (Mon-Fri 8am-5pm):  [phone] On Call:  [phone] & follow prompts after 5pm & weekends Office Phone:  [phone] Office Fax:  [phone]

## 2016-04-12 ENCOUNTER — Non-Acute Institutional Stay (SKILLED_NURSING_FACILITY): Payer: Medicare Other | Admitting: Gerontology

## 2016-04-12 DIAGNOSIS — J9621 Acute and chronic respiratory failure with hypoxia: Secondary | ICD-10-CM | POA: Diagnosis not present

## 2016-04-13 NOTE — Progress Notes (Signed)
Location:  [facility] Service:  SNF [phone]) Provider:  Lorenso Quarry, NP-C  Shane Crutch, MD  Patient Care Team: Shane Crutch, MD as PCP - General (Pulmonary Disease)  Extended Emergency Contact Information Primary Emergency Contact: Lavell Anchors Address: [address]          [place], [place] [address] [place] of [place] Home Phone: [phone] Mobile Phone: [phone] Relation: Sister  Code Status:  DNR Goals of care: Advanced Directive information Advanced Directives 03/20/2016  Does patient have an advance directive? No  Would patient like information on creating an advanced directive? No - patient declined information     Chief Complaint  Patient presents with  . Follow-up    HPI:  Pt is a 65 y.o. female seen today for medical management of chronic diseases. Pt was recently discharged from the hospital on Hospice services for Acute on Chronic Respiratory Failure with Hypoxia. She appears comfortable. No complaints of pain or dyspnea. Her respirations are even and unlabored.   Past Medical History  Diagnosis Date  . COPD (chronic obstructive pulmonary disease) [facility])    Past Surgical History  Procedure Laterality Date  . Craniotomy Right 07/27/2014    Procedure: CRANIOTOMY INTRACRANIAL ANEURYSM FOR Pterional Aneurysm;  Surgeon: Lisbeth Renshaw, MD;  Location: [facility] NEURO ORS;  Service: Neurosurgery;  Laterality: Right;  . Esophagogastroduodenoscopy (egd) with propofol N/A 08/05/2014    Procedure: ESOPHAGOGASTRODUODENOSCOPY (EGD) WITH PROPOFOL;  Surgeon: Violeta Gelinas, MD;  Location: [facility] ENDOSCOPY;  Service: General;  Laterality: N/A;  . Peg placement N/A 08/05/2014    Procedure: PERCUTANEOUS ENDOSCOPIC GASTROSTOMY (PEG) PLACEMENT;  Surgeon: Violeta Gelinas, MD;  Location: [facility] ENDOSCOPY;  Service: General;  Laterality: N/A;  bedside/tech only    Allergies  Allergen Reactions  . Augmentin [Amoxicillin-Pot Clavulanate] Other  (See Comments)    Reaction: unknown  . Vancomycin     Pt had red rash all over body after vanc was given, this was told to me in report but had not been added to the allergy list       Medication List       This list is accurate as of: 04/12/16 11:59 PM.  Always use your most recent med list.               acetaminophen 325 MG tablet  Commonly known as:  TYLENOL  Take 650 mg by mouth every 4 (four) hours as needed.     clonazePAM 0.5 MG tablet  Commonly known as:  KLONOPIN  Take 1 tablet (0.5 mg total) by mouth 2 (two) times daily.     feeding supplement (GLUCERNA 1.2 CAL) Liqd  Place 1,000 mLs into feeding tube continuous.     fidaxomicin 200 MG Tabs tablet  Commonly known as:  DIFICID  Take 1 tablet (200 mg total) by mouth 2 (two) times daily.     free water Soln  Place 100 mLs into feeding tube every 8 (eight) hours.     levETIRAcetam 500 MG tablet  Commonly known as:  KEPPRA  Take 500 mg by mouth 2 (two) times daily.     levothyroxine 25 MCG tablet  Commonly known as:  SYNTHROID, LEVOTHROID  Take 25 mcg by mouth daily before breakfast.     LORazepam 1 MG tablet  Commonly known as:  ATIVAN  Take 1 tablet (1 mg total) by mouth every 4 (four) hours as needed for anxiety.     metoprolol 50 MG tablet  Commonly  known as:  LOPRESSOR  Take 50 mg by mouth 2 (two) times daily.     morphine CONCENTRATE 10 MG/0.5ML Soln concentrated solution  Take 0.25 mLs (5 mg total) by mouth every 2 (two) hours as needed for moderate pain, severe pain or shortness of breath.     ondansetron 4 MG tablet  Commonly known as:  ZOFRAN  Take 1 tablet (4 mg total) by mouth every 6 (six) hours as needed for nausea.     predniSONE 5 MG tablet  Commonly known as:  DELTASONE  Take 5 mg by mouth daily with breakfast.        Review of Systems  Unable to perform ROS: Dementia  Constitutional: Negative for fever and fatigue.  HENT: Negative.   Respiratory: Negative for cough, choking  and chest tightness.   Cardiovascular: Negative for chest pain and leg swelling.  Gastrointestinal: Positive for abdominal distention.  Genitourinary: Negative.   Musculoskeletal: Negative.   Skin: Negative.   Neurological: Negative.   Psychiatric/Behavioral: Negative.   All other systems reviewed and are negative.   Immunization History  Administered Date(s) Administered  . Pneumococcal Polysaccharide-23 07/30/2014   Pertinent  Health Maintenance Due  Topic Date Due  . MAMMOGRAM  11/04/1998  . COLONOSCOPY  11/04/1998  . DEXA SCAN  11/04/2013  . PNA vac Low Risk Adult (2 of 2 - PCV13) 07/31/2015  . INFLUENZA VACCINE  04/24/2016   No flowsheet data found. Functional Status Survey:    Filed Vitals:   04/12/16 1846  BP: 115/60  Pulse: 89  Temp: 98.4 F (36.9 C)  Resp: 21  SpO2: 98%   There is no weight on file to calculate BMI. Physical Exam  Constitutional: She is oriented to person, place, and time. She appears well-developed and well-nourished. No distress.  HENT:  Head: Normocephalic.  Eyes: Conjunctivae are normal. Pupils are equal, round, and reactive to light.  Neck: Normal range of motion. Neck supple. No JVD present. No tracheal deviation present.  Cardiovascular: Normal pulses.  An irregular rhythm present. Tachycardia present.  Exam reveals no gallop, no distant heart sounds and no friction rub.   No murmur heard. Pulmonary/Chest: Effort normal. No accessory muscle usage or stridor. No respiratory distress. She has decreased breath sounds in the right lower field and the left lower field.  Abdominal: Soft. Normal appearance and bowel sounds are normal. She exhibits distension. There is tenderness.  G-tube in place  Genitourinary:  Foley  Neurological: She is alert and oriented to person, place, and time.  Skin: Skin is warm, dry and intact. She is not diaphoretic. No cyanosis. There is pallor. Nails show no clubbing.  Psychiatric: She has a normal mood and  affect. Her behavior is normal. Judgment and thought content normal. Her speech is slurred (normal for pt). Cognition and memory are normal.    Labs reviewed:  Recent Labs  03/22/16 0338  03/26/16 0513 03/28/16 0609 03/29/16 0527  NA 145  < > 131* 127* 128*  K 4.3  < > 4.4 5.2* 4.6  CL 108  < > 87* 84* 90*  CO2 36*  < > 41* 37* 35*  GLUCOSE 171*  < > 157* 178* 174*  BUN 29*  < > CREATININE 0.67  < > 0.57 0.50 0.46  CALCIUM 8.2*  < > 8.1* 8.2* 7.6*  MG 2.4  --  1.7 1.9  --   < > = values in this interval not displayed.  Recent Labs  03/13/16 1228 03/20/16 0634 03/20/16 1112  AST 23 30 24   ALT 52 35 31  ALKPHOS 57 131* 116  BILITOT 0.2* <0.1* 0.2*  PROT 5.9* 5.0* 5.0*  ALBUMIN 2.7* 2.2* 2.1*    Recent Labs  03/17/16 0910 03/20/16 0634 03/20/16 1112  03/26/16 0513 03/28/16 0609 03/29/16 0527  WBC 16.8* 55.3* 56.3*  < > 28.1* 49.7* 32.3*  NEUTROABS 12.9* 52.5* 52.9*  --   --   --   --   HGB 10.6* 11.2* 11.0*  < > 10.4* 9.9* 8.8*  HCT 31.9* 35.1 34.2*  < > 31.7* 30.4* 26.8*  MCV 84.1 86.3 85.8  < > 85.2 83.3 84.3  PLT 241 308 306  < > 207 342 314  < > = values in this interval not displayed. Lab Results  Component Value Date   TSH 2.440 06/23/2015   Lab Results  Component Value Date   HGBA1C 7.5* 03/20/2016   Lab Results  Component Value Date   CHOL 165 06/23/2015   HDL 64 06/23/2015   LDLCALC 85 06/23/2015   TRIG 81 06/23/2015   CHOLHDL 2.6 06/23/2015    Significant Diagnostic Results in last 30 days:  Dg Chest 1 View  03/24/2016  CLINICAL DATA:  Shortness of Breath EXAM: CHEST 1 VIEW COMPARISON:  03/20/2016 FINDINGS: Cardiac shadow is within normal limits. The lungs are well aerated bilaterally. Minimal right basilar atelectasis and small left pleural effusion are seen. No bony abnormality is noted. IMPRESSION: Minimal bibasilar changes. Electronically Signed   By: Alcide CleverMark  Lukens M.D.   On: 03/24/2016 07:19   Ct Angio Chest Pe W/cm &/or Wo  Cm  03/20/2016  CLINICAL DATA:  Tachycardia, tachypnea, and dyspnea. EXAM: CT ANGIOGRAPHY CHEST WITH CONTRAST TECHNIQUE: Multidetector CT imaging of the chest was performed using the standard protocol during bolus administration of intravenous contrast. Multiplanar CT image reconstructions and MIPs were obtained to evaluate the vascular anatomy. CONTRAST:  75 cc Isovue 370 intravenous COMPARISON:  05/10/2014 FINDINGS: Cardiovascular: Normal heart size. No pericardial effusion. Atherosclerosis, including along the LAD. When allowing for intermittent motion artifact there is no indication of acute pulmonary embolism. No acute aortic finding. Mediastinum:  Negative for adenopathy. Lungs/Pleura: Extensive airway thickening with segmental narrowing or collapse. There is chronic collapse of the right middle lobe. Heterogeneous density of the apical lungs likely from air trapping, similar pattern seen on comparison exam. No suspicious nodularity when compared to prior; small clusters of pulmonary nodules are stable to decreased. Trace pleural effusions. Upper abdomen: No acute findings. Musculoskeletal: Mild body wall edema, symmetric. No acute osseous finding. Review of the MIP images confirms the above findings. IMPRESSION: 1. Advanced bronchitis with diffuse airway narrowing and chronic right middle lobe collapse. No consolidating pneumonia. 2. Motion degraded study without evidence of pulmonary embolism. 3. Trace pleural effusions. Electronically Signed   By: Marnee SpringJonathon  Watts M.D.   On: 03/20/2016 09:34   Dg Chest Port 1 View  03/20/2016  CLINICAL DATA:  Tachypnea, tachycardia, sepsis. EXAM: PORTABLE CHEST 1 VIEW COMPARISON:  CT and plain film 03/20/2016 FINDINGS: Medial right infrahilar airspace disease again noted seen on prior plain films and CT compatible with right middle lobe collapse. Bibasilar atelectasis. Heart is normal size. No effusions or acute bony abnormality. No change since prior study. IMPRESSION:  Continued right middle lobe collapse.  Bibasilar atelectasis. No change since prior study. Electronically Signed   By: Charlett NoseKevin  Dover M.D.   On: 03/20/2016 11:02   [facility]  [facility]  CLINICAL DATA:  Tachypnea and tachycardia EXAM: PORTABLE CHEST 1 VIEW COMPARISON:  September 02, 2014 FINDINGS: Tracheostomy catheter no longer present. There is airspace consolidation with volume loss in the right middle lobe adjacent to the right heart border. Lungs elsewhere clear. Heart size and pulmonary vascularity are normal. No adenopathy. There is calcification in the aortic arch region. IMPRESSION: Right middle lobe airspace opacity, likely pneumonia. Lungs elsewhere clear. Cardiac silhouette within normal limits. Aortic atherosclerosis. Electronically Signed   By: Bretta Bang III M.D.   On: 03/20/2016 07:19    Assessment/Plan 1. Acute on chronic respiratory failure with hypoxia (HCC)  Continue with comfort care. Titrate oxygen for comfort, Continue oral Morphine for dyspnea, pain.    Family/ staff Communication:   Total Time: 30 minutes  Documentation: 15 minutes  Face to Face: 15 minutes  Family/Phone:   Labs/tests ordered:  none   Brynda Rim, NP-C Geriatrics [facility] [address], [place] [address] Cell Phone (Mon-Fri 8am-5pm):  [phone] On Call:  [phone] & follow prompts after 5pm & weekends Office Phone:  [phone] Office Fax:  [phone]

## 2016-04-18 DIAGNOSIS — A047 Enterocolitis due to Clostridium difficile: Secondary | ICD-10-CM | POA: Diagnosis present

## 2016-04-18 LAB — C DIFFICILE QUICK SCREEN W PCR REFLEX
C DIFFICILE (CDIFF) TOXIN: NEGATIVE
C DIFFICLE (CDIFF) ANTIGEN: POSITIVE — AB

## 2016-04-18 LAB — CLOSTRIDIUM DIFFICILE BY PCR: CDIFFPCR: POSITIVE — AB

## 2016-04-24 ENCOUNTER — Encounter
Admission: RE | Admit: 2016-04-24 | Discharge: 2016-04-24 | Disposition: A | Discharge status: Home / Self Care | Source: Ambulatory Visit | Attending: Internal Medicine | Admitting: Internal Medicine

## 2016-04-24 DIAGNOSIS — A047 Enterocolitis due to Clostridium difficile: Secondary | ICD-10-CM | POA: Insufficient documentation

## 2016-05-02 DIAGNOSIS — A047 Enterocolitis due to Clostridium difficile: Secondary | ICD-10-CM | POA: Diagnosis not present

## 2016-05-02 LAB — C DIFFICILE QUICK SCREEN W PCR REFLEX
C DIFFICILE (CDIFF) INTERP: DETECTED
C DIFFICILE (CDIFF) TOXIN: POSITIVE — AB
C Diff antigen: POSITIVE — AB

## 2016-05-25 DEATH — deceased
